# Patient Record
Sex: Male | Born: 1950 | Hispanic: Yes | Marital: Married | Smoking: Former smoker
Health system: Southern US, Community
[De-identification: ages and names within clinical notes are randomized; demographics above are authoritative.]

## PROBLEM LIST (undated history)

## (undated) DIAGNOSIS — R350 Frequency of micturition: Secondary | ICD-10-CM

## (undated) DIAGNOSIS — R51 Headache: Secondary | ICD-10-CM

## (undated) DIAGNOSIS — I10 Essential (primary) hypertension: Secondary | ICD-10-CM

## (undated) DIAGNOSIS — F329 Major depressive disorder, single episode, unspecified: Secondary | ICD-10-CM

## (undated) DIAGNOSIS — R200 Anesthesia of skin: Secondary | ICD-10-CM

## (undated) DIAGNOSIS — M542 Cervicalgia: Secondary | ICD-10-CM

## (undated) DIAGNOSIS — M1990 Unspecified osteoarthritis, unspecified site: Secondary | ICD-10-CM

## (undated) DIAGNOSIS — F32A Depression, unspecified: Secondary | ICD-10-CM

## (undated) DIAGNOSIS — E119 Type 2 diabetes mellitus without complications: Secondary | ICD-10-CM

## (undated) DIAGNOSIS — R519 Headache, unspecified: Secondary | ICD-10-CM

## (undated) DIAGNOSIS — K4090 Unilateral inguinal hernia, without obstruction or gangrene, not specified as recurrent: Secondary | ICD-10-CM

## (undated) DIAGNOSIS — R202 Paresthesia of skin: Secondary | ICD-10-CM

## (undated) DIAGNOSIS — E7800 Pure hypercholesterolemia, unspecified: Secondary | ICD-10-CM

## (undated) DIAGNOSIS — C801 Malignant (primary) neoplasm, unspecified: Secondary | ICD-10-CM

## (undated) HISTORY — PX: SHOULDER ARTHROSCOPY: SHX128

## (undated) HISTORY — PX: BACK SURGERY: SHX140

---

## 2014-08-03 ENCOUNTER — Other Ambulatory Visit (HOSPITAL_COMMUNITY): Payer: Self-pay | Admitting: Family

## 2014-08-03 DIAGNOSIS — R1031 Right lower quadrant pain: Secondary | ICD-10-CM

## 2014-08-15 ENCOUNTER — Ambulatory Visit (HOSPITAL_COMMUNITY)
Admission: RE | Admit: 2014-08-15 | Discharge: 2014-08-15 | Disposition: A | Discharge status: Home / Self Care | Payer: Medicaid Other | Source: Ambulatory Visit | Attending: Family | Admitting: Family

## 2014-08-15 DIAGNOSIS — R109 Unspecified abdominal pain: Secondary | ICD-10-CM | POA: Diagnosis present

## 2014-08-15 DIAGNOSIS — R1031 Right lower quadrant pain: Secondary | ICD-10-CM

## 2014-08-15 DIAGNOSIS — K4020 Bilateral inguinal hernia, without obstruction or gangrene, not specified as recurrent: Secondary | ICD-10-CM | POA: Diagnosis not present

## 2014-08-15 DIAGNOSIS — N400 Enlarged prostate without lower urinary tract symptoms: Secondary | ICD-10-CM | POA: Insufficient documentation

## 2014-08-15 MED ORDER — IOHEXOL 300 MG/ML  SOLN
100.0000 mL | Freq: Once | INTRAMUSCULAR | Status: AC | PRN
  Administered 2014-08-15: 100 mL via INTRAVENOUS

## 2015-03-21 ENCOUNTER — Other Ambulatory Visit: Payer: Self-pay | Admitting: Neurosurgery

## 2015-03-29 ENCOUNTER — Encounter (HOSPITAL_COMMUNITY): Payer: Self-pay

## 2015-03-29 ENCOUNTER — Encounter (HOSPITAL_COMMUNITY)
Admission: RE | Admit: 2015-03-29 | Discharge: 2015-03-29 | Disposition: A | Discharge status: Home / Self Care | Payer: Medicaid Other | Source: Ambulatory Visit | Attending: Neurosurgery | Admitting: Neurosurgery

## 2015-03-29 DIAGNOSIS — M50021 Cervical disc disorder at C4-C5 level with myelopathy: Secondary | ICD-10-CM | POA: Diagnosis not present

## 2015-03-29 DIAGNOSIS — I110 Hypertensive heart disease with heart failure: Secondary | ICD-10-CM | POA: Diagnosis not present

## 2015-03-29 DIAGNOSIS — E119 Type 2 diabetes mellitus without complications: Secondary | ICD-10-CM | POA: Diagnosis not present

## 2015-03-29 DIAGNOSIS — Z01812 Encounter for preprocedural laboratory examination: Secondary | ICD-10-CM | POA: Insufficient documentation

## 2015-03-29 DIAGNOSIS — M50022 Cervical disc disorder at C5-C6 level with myelopathy: Secondary | ICD-10-CM | POA: Diagnosis not present

## 2015-03-29 DIAGNOSIS — M4712 Other spondylosis with myelopathy, cervical region: Secondary | ICD-10-CM | POA: Diagnosis present

## 2015-03-29 DIAGNOSIS — E7800 Pure hypercholesterolemia, unspecified: Secondary | ICD-10-CM | POA: Diagnosis not present

## 2015-03-29 DIAGNOSIS — Z01818 Encounter for other preprocedural examination: Secondary | ICD-10-CM | POA: Insufficient documentation

## 2015-03-29 DIAGNOSIS — F17210 Nicotine dependence, cigarettes, uncomplicated: Secondary | ICD-10-CM | POA: Diagnosis not present

## 2015-03-29 HISTORY — DX: Frequency of micturition: R35.0

## 2015-03-29 HISTORY — DX: Major depressive disorder, single episode, unspecified: F32.9

## 2015-03-29 HISTORY — DX: Anesthesia of skin: R20.0

## 2015-03-29 HISTORY — DX: Essential (primary) hypertension: I10

## 2015-03-29 HISTORY — DX: Pure hypercholesterolemia, unspecified: E78.00

## 2015-03-29 HISTORY — DX: Depression, unspecified: F32.A

## 2015-03-29 HISTORY — DX: Paresthesia of skin: R20.2

## 2015-03-29 HISTORY — DX: Type 2 diabetes mellitus without complications: E11.9

## 2015-03-29 LAB — BASIC METABOLIC PANEL
Anion gap: 9 (ref 5–15)
BUN: 14 mg/dL (ref 6–20)
CO2: 23 mmol/L (ref 22–32)
Calcium: 9.6 mg/dL (ref 8.9–10.3)
Chloride: 108 mmol/L (ref 101–111)
Creatinine, Ser: 0.66 mg/dL (ref 0.61–1.24)
GFR calc Af Amer: >60 mL/min (ref >60)
GFR calc non Af Amer: >60 mL/min (ref >60)
Glucose, Bld: 128 mg/dL — ABNORMAL HIGH (ref 65–99)
Potassium: 4 mmol/L (ref 3.5–5.1)
Sodium: 140 mmol/L (ref 135–145)

## 2015-03-29 LAB — CBC
HCT: 51.4 % (ref 39.0–52.0)
Hemoglobin: 17.9 g/dL — ABNORMAL HIGH (ref 13.0–17.0)
MCH: 31.2 pg (ref 26.0–34.0)
MCHC: 34.8 g/dL (ref 30.0–36.0)
MCV: 89.5 fL (ref 78.0–100.0)
Platelets: 186 10*3/uL (ref 150–400)
RBC: 5.74 MIL/uL (ref 4.22–5.81)
RDW: 13.7 % (ref 11.5–15.5)
WBC: 9.2 10*3/uL (ref 4.0–10.5)

## 2015-03-29 LAB — SURGICAL PCR SCREEN
MRSA, PCR: NEGATIVE
Staphylococcus aureus: NEGATIVE

## 2015-03-29 LAB — GLUCOSE, CAPILLARY: Glucose-Capillary: 143 mg/dL — ABNORMAL HIGH (ref 65–99)

## 2015-03-29 NOTE — Pre-Procedure Instructions (Signed)
Chase [name]  03/29/2015      [facility] [address], [place] [address] [place] [address] Phone: [phone] Fax: [phone]    Your procedure is scheduled on Wednesday, November 2nd, 2016.  Report to [facility] Admitting at  7:00 A.M.  Call this number if you have problems the morning of surgery:  [phone]   Remember:  Do not eat food or drink liquids after midnight.   Take these medicines the morning of surgery with A SIP OF WATER: None.   What do I do about my diabetes medications?   Do not take Glyburide-metformin, oral diabetes medicines (pills) the morning of surgery.  Night prior to surgery, do NOT take evening/dinner dose of Glyburide-metformin (Glucovance).     Stop taking the following: Omega-3 Fatty Acids (Fish Oil), Aspirin, NSAIDS, Aleve, Naproxen, BC's, Goody's, Ibuprofen, Advil, Motrin, all herbal medications, and all vitamins.    Do not wear jewelry.  Do not wear lotions, powders, or colognes.  You may NOT wear deodorant.  Men may shave face and neck.  Do not bring valuables to the hospital.  [facility] is not responsible for any belongings or valuables.  Contacts, dentures or bridgework may not be worn into surgery.  Leave your suitcase in the car.  After surgery it may be brought to your room.  For patients admitted to the hospital, discharge time will be determined by your treatment team.  Patients discharged the day of surgery will not be allowed to drive home.   Special instructions:  See attached.   Please read over the following fact sheets that you were given. Pain Booklet, Coughing and Deep Breathing, MRSA Information and Surgical Site Infection Prevention    How to Manage Your Diabetes Before Surgery   Why is it important to control my blood sugar before and after surgery?   Improving blood sugar levels before and after surgery helps healing and can limit problems.  A way of  improving blood sugar control is eating a healthy diet by:  - Eating less sugar and carbohydrates  - Increasing activity/exercise  - Talk with your doctor about reaching your blood sugar goals  High blood sugars (greater than 180 mg/dL) can raise your risk of infections and slow down your recovery so you will need to focus on controlling your diabetes during the weeks before surgery.  Make sure that the doctor who takes care of your diabetes knows about your planned surgery including the date and location.  How do I manage my blood sugars before surgery?   Check your blood sugar at least 4 times a day, 2 days before surgery to make sure that they are not too high or low.   Check your blood sugar the morning of your surgery when you wake up and every 2 hours until you get to the Short-Stay unit.  If your blood sugar is less than 70 mg/dL, you will need to treat for low blood sugar by:  Treat a low blood sugar (less than 70 mg/dL) with 1/2 cup of clear juice (cranberry or apple), 4 glucose tablets, OR glucose gel.  Recheck blood sugar in 15 minutes after treatment (to make sure it is greater than 70 mg/dL).  If blood sugar is not greater than 70 mg/dL on re-check, call [phone] for further instructions.   Report your blood sugar to the Short-Stay nurse when you get to Short-Stay.  References:  [employer] [facility], 2007 "How to Manage your Diabetes Before and After Surgery".

## 2015-03-29 NOTE — Progress Notes (Signed)
   03/29/15 [place]  Have you ever been diagnosed with sleep apnea through a sleep study? No  Do you snore loudly (loud enough to be heard through closed doors)?  1  Do you often feel tired, fatigued, or sleepy during the daytime (such as falling asleep during driving or talking to someone)? 1  Has anyone observed you stop breathing during your sleep? 0  Do you have, or are you being treated for high blood pressure? 1  BMI more than 35 kg/m2? 0  Age > 50 (1-yes) 1  Neck circumference greater than:Male 16 inches or larger, Male 17inches or larger? 0  Male Gender (Yes=1) 1  Obstructive Sleep Apnea Score 5

## 2015-03-29 NOTE — Progress Notes (Addendum)
Interpreter present for PAT appointment.  PCP - [facility] Cardiologist - denies  EKG - 03/29/2015 CXR -denies  Echo/Stress test/Cardiac cath -denies  Patient denies shortness of breath and chest pain at PAT appointment.

## 2015-03-30 ENCOUNTER — Other Ambulatory Visit (HOSPITAL_COMMUNITY): Payer: Medicaid Other

## 2015-03-30 LAB — HEMOGLOBIN A1C
Hgb A1c MFr Bld: 8.6 % — ABNORMAL HIGH (ref 4.8–5.6)
Mean Plasma Glucose: 200 mg/dL

## 2015-04-03 MED ORDER — CEFAZOLIN SODIUM-DEXTROSE 2-3 GM-% IV SOLR
2.0000 g | INTRAVENOUS | Status: AC
  Administered 2015-04-04: 2 g via INTRAVENOUS
  Filled 2015-04-03: qty 50

## 2015-04-04 ENCOUNTER — Encounter (HOSPITAL_COMMUNITY): Payer: Self-pay | Admitting: Anesthesiology

## 2015-04-04 ENCOUNTER — Ambulatory Visit (HOSPITAL_COMMUNITY): Payer: Medicaid Other

## 2015-04-04 ENCOUNTER — Ambulatory Visit (HOSPITAL_COMMUNITY)
Admission: RE | Admit: 2015-04-04 | Discharge: 2015-04-05 | Disposition: A | Discharge status: Home / Self Care | Payer: Medicaid Other | Source: Ambulatory Visit | Attending: Neurosurgery | Admitting: Neurosurgery

## 2015-04-04 ENCOUNTER — Ambulatory Visit (HOSPITAL_COMMUNITY): Payer: Medicaid Other | Admitting: Certified Registered"

## 2015-04-04 ENCOUNTER — Encounter (HOSPITAL_COMMUNITY)
Admission: RE | Disposition: A | Discharge status: Home / Self Care | Payer: Self-pay | Source: Ambulatory Visit | Attending: Neurosurgery

## 2015-04-04 DIAGNOSIS — E7800 Pure hypercholesterolemia, unspecified: Secondary | ICD-10-CM | POA: Diagnosis not present

## 2015-04-04 DIAGNOSIS — I110 Hypertensive heart disease with heart failure: Secondary | ICD-10-CM | POA: Insufficient documentation

## 2015-04-04 DIAGNOSIS — M50022 Cervical disc disorder at C5-C6 level with myelopathy: Secondary | ICD-10-CM | POA: Diagnosis not present

## 2015-04-04 DIAGNOSIS — M4712 Other spondylosis with myelopathy, cervical region: Secondary | ICD-10-CM | POA: Diagnosis not present

## 2015-04-04 DIAGNOSIS — Z419 Encounter for procedure for purposes other than remedying health state, unspecified: Secondary | ICD-10-CM

## 2015-04-04 DIAGNOSIS — F17210 Nicotine dependence, cigarettes, uncomplicated: Secondary | ICD-10-CM | POA: Insufficient documentation

## 2015-04-04 DIAGNOSIS — M5000 Cervical disc disorder with myelopathy, unspecified cervical region: Secondary | ICD-10-CM | POA: Diagnosis present

## 2015-04-04 DIAGNOSIS — M50021 Cervical disc disorder at C4-C5 level with myelopathy: Secondary | ICD-10-CM | POA: Diagnosis not present

## 2015-04-04 DIAGNOSIS — E119 Type 2 diabetes mellitus without complications: Secondary | ICD-10-CM | POA: Insufficient documentation

## 2015-04-04 HISTORY — PX: ANTERIOR CERVICAL DECOMP/DISCECTOMY FUSION: SHX1161

## 2015-04-04 LAB — GLUCOSE, CAPILLARY
Glucose-Capillary: 96 mg/dL (ref 65–99)
Glucose-Capillary: 109 mg/dL — ABNORMAL HIGH (ref 65–99)
Glucose-Capillary: 177 mg/dL — ABNORMAL HIGH (ref 65–99)
Glucose-Capillary: 193 mg/dL — ABNORMAL HIGH (ref 65–99)

## 2015-04-04 SURGERY — ANTERIOR CERVICAL DECOMPRESSION/DISCECTOMY FUSION 2 LEVELS
Anesthesia: General

## 2015-04-04 MED ORDER — PROMETHAZINE HCL 25 MG/ML IJ SOLN: 6.2500 mg | INTRAMUSCULAR | Status: DC | PRN

## 2015-04-04 MED ORDER — FENTANYL CITRATE (PF) 250 MCG/5ML IJ SOLN
INTRAMUSCULAR | Status: AC
  Filled 2015-04-04: qty 5

## 2015-04-04 MED ORDER — GLYCOPYRROLATE 0.2 MG/ML IJ SOLN
INTRAMUSCULAR | Status: AC
  Filled 2015-04-04: qty 1

## 2015-04-04 MED ORDER — LACTATED RINGERS IV SOLN
INTRAVENOUS | Status: DC | PRN
  Administered 2015-04-04 (×2): via INTRAVENOUS

## 2015-04-04 MED ORDER — FENTANYL CITRATE (PF) 100 MCG/2ML IJ SOLN
INTRAMUSCULAR | Status: DC | PRN
  Administered 2015-04-04: 50 ug via INTRAVENOUS
  Administered 2015-04-04: 100 ug via INTRAVENOUS
  Administered 2015-04-04 (×2): 50 ug via INTRAVENOUS

## 2015-04-04 MED ORDER — ZOLPIDEM TARTRATE 5 MG PO TABS: 5.0000 mg | ORAL_TABLET | Freq: Every evening | ORAL | Status: DC | PRN

## 2015-04-04 MED ORDER — 0.9 % SODIUM CHLORIDE (POUR BTL) OPTIME
TOPICAL | Status: DC | PRN
  Administered 2015-04-04: 1000 mL

## 2015-04-04 MED ORDER — HYDROMORPHONE HCL 1 MG/ML IJ SOLN
0.2500 mg | INTRAMUSCULAR | Status: DC | PRN
  Administered 2015-04-04 (×2): 0.5 mg via INTRAVENOUS

## 2015-04-04 MED ORDER — GLYBURIDE 5 MG PO TABS
5.0000 mg | ORAL_TABLET | Freq: Two times a day (BID) | ORAL | Status: DC
  Administered 2015-04-04: 5 mg via ORAL
  Filled 2015-04-04 (×4): qty 1

## 2015-04-04 MED ORDER — SODIUM CHLORIDE 0.9 % IJ SOLN
3.0000 mL | Freq: Two times a day (BID) | INTRAMUSCULAR | Status: DC
  Administered 2015-04-04: 3 mL via INTRAVENOUS

## 2015-04-04 MED ORDER — PHENYLEPHRINE 40 MCG/ML (10ML) SYRINGE FOR IV PUSH (FOR BLOOD PRESSURE SUPPORT)
PREFILLED_SYRINGE | INTRAVENOUS | Status: AC
  Filled 2015-04-04: qty 10

## 2015-04-04 MED ORDER — PROPOFOL 10 MG/ML IV BOLUS
INTRAVENOUS | Status: DC | PRN
  Administered 2015-04-04: 160 mg via INTRAVENOUS

## 2015-04-04 MED ORDER — DIAZEPAM 5 MG PO TABS
5.0000 mg | ORAL_TABLET | Freq: Four times a day (QID) | ORAL | Status: DC | PRN
  Administered 2015-04-04: 5 mg via ORAL
  Filled 2015-04-04: qty 1

## 2015-04-04 MED ORDER — PRAVASTATIN SODIUM 40 MG PO TABS
40.0000 mg | ORAL_TABLET | Freq: Every day | ORAL | Status: DC
  Administered 2015-04-04 – 2015-04-05 (×2): 40 mg via ORAL
  Filled 2015-04-04 (×2): qty 1

## 2015-04-04 MED ORDER — MORPHINE SULFATE (PF) 2 MG/ML IV SOLN
1.0000 mg | INTRAVENOUS | Status: DC | PRN
  Administered 2015-04-04: 4 mg via INTRAVENOUS
  Filled 2015-04-04: qty 2

## 2015-04-04 MED ORDER — SUGAMMADEX SODIUM 200 MG/2ML IV SOLN
INTRAVENOUS | Status: AC
  Filled 2015-04-04: qty 2

## 2015-04-04 MED ORDER — SODIUM CHLORIDE 0.9 % IV SOLN: 250.0000 mL | INTRAVENOUS | Status: DC

## 2015-04-04 MED ORDER — MEPERIDINE HCL 25 MG/ML IJ SOLN: 6.2500 mg | INTRAMUSCULAR | Status: DC | PRN

## 2015-04-04 MED ORDER — ROCURONIUM BROMIDE 50 MG/5ML IV SOLN
INTRAVENOUS | Status: AC
  Filled 2015-04-04: qty 1

## 2015-04-04 MED ORDER — MENTHOL 3 MG MT LOZG: 1.0000 | LOZENGE | OROMUCOSAL | Status: DC | PRN

## 2015-04-04 MED ORDER — METFORMIN HCL 500 MG PO TABS
500.0000 mg | ORAL_TABLET | Freq: Two times a day (BID) | ORAL | Status: DC
  Administered 2015-04-04: 500 mg via ORAL
  Filled 2015-04-04 (×2): qty 1

## 2015-04-04 MED ORDER — LISINOPRIL 5 MG PO TABS
5.0000 mg | ORAL_TABLET | Freq: Every day | ORAL | Status: DC
  Administered 2015-04-04 – 2015-04-05 (×2): 5 mg via ORAL
  Filled 2015-04-04 (×2): qty 1

## 2015-04-04 MED ORDER — PHENOL 1.4 % MT LIQD: 1.0000 | OROMUCOSAL | Status: DC | PRN

## 2015-04-04 MED ORDER — MIDAZOLAM HCL 2 MG/2ML IJ SOLN
INTRAMUSCULAR | Status: AC
  Filled 2015-04-04: qty 4

## 2015-04-04 MED ORDER — HYDROCODONE-ACETAMINOPHEN 5-325 MG PO TABS
1.0000 | ORAL_TABLET | ORAL | Status: DC | PRN
  Administered 2015-04-04 – 2015-04-05 (×2): 2 via ORAL
  Filled 2015-04-04 (×2): qty 1
  Filled 2015-04-04: qty 2

## 2015-04-04 MED ORDER — ACETAMINOPHEN 325 MG PO TABS: 650.0000 mg | ORAL_TABLET | ORAL | Status: DC | PRN

## 2015-04-04 MED ORDER — OXYCODONE-ACETAMINOPHEN 5-325 MG PO TABS
1.0000 | ORAL_TABLET | ORAL | Status: DC | PRN
  Administered 2015-04-04 – 2015-04-05 (×2): 2 via ORAL
  Filled 2015-04-04 (×2): qty 2

## 2015-04-04 MED ORDER — ONDANSETRON HCL 4 MG/2ML IJ SOLN
INTRAMUSCULAR | Status: DC | PRN
  Administered 2015-04-04: 4 mg via INTRAVENOUS

## 2015-04-04 MED ORDER — HYDROMORPHONE HCL 1 MG/ML IJ SOLN
INTRAMUSCULAR | Status: AC
  Filled 2015-04-04: qty 1

## 2015-04-04 MED ORDER — SUGAMMADEX SODIUM 200 MG/2ML IV SOLN
INTRAVENOUS | Status: DC | PRN
  Administered 2015-04-04: 141.8 mg via INTRAVENOUS

## 2015-04-04 MED ORDER — LIDOCAINE-EPINEPHRINE 0.5 %-1:200000 IJ SOLN
INTRAMUSCULAR | Status: DC | PRN
  Administered 2015-04-04: 3 mL

## 2015-04-04 MED ORDER — POLYETHYLENE GLYCOL 3350 17 G PO PACK: 17.0000 g | PACK | Freq: Every day | ORAL | Status: DC | PRN

## 2015-04-04 MED ORDER — ROCURONIUM BROMIDE 100 MG/10ML IV SOLN
INTRAVENOUS | Status: DC | PRN
  Administered 2015-04-04: 10 mg via INTRAVENOUS
  Administered 2015-04-04: 40 mg via INTRAVENOUS

## 2015-04-04 MED ORDER — DEXAMETHASONE SODIUM PHOSPHATE 4 MG/ML IJ SOLN
INTRAMUSCULAR | Status: DC | PRN
  Administered 2015-04-04: 4 mg via INTRAVENOUS

## 2015-04-04 MED ORDER — LACTATED RINGERS IV SOLN
INTRAVENOUS | Status: DC
  Administered 2015-04-04: 08:00:00 via INTRAVENOUS

## 2015-04-04 MED ORDER — PHENYLEPHRINE HCL 10 MG/ML IJ SOLN
INTRAMUSCULAR | Status: DC | PRN
  Administered 2015-04-04 (×5): 80 ug via INTRAVENOUS

## 2015-04-04 MED ORDER — THROMBIN 5000 UNITS EX SOLR
CUTANEOUS | Status: DC | PRN
  Administered 2015-04-04: 5000 [IU] via TOPICAL

## 2015-04-04 MED ORDER — PROPOFOL 10 MG/ML IV BOLUS
INTRAVENOUS | Status: AC
  Filled 2015-04-04: qty 20

## 2015-04-04 MED ORDER — ACETAMINOPHEN 650 MG RE SUPP: 650.0000 mg | RECTAL | Status: DC | PRN

## 2015-04-04 MED ORDER — SODIUM CHLORIDE 0.9 % IJ SOLN: 3.0000 mL | INTRAMUSCULAR | Status: DC | PRN

## 2015-04-04 MED ORDER — POTASSIUM CHLORIDE IN NACL 20-0.9 MEQ/L-% IV SOLN
INTRAVENOUS | Status: DC
  Filled 2015-04-04 (×4): qty 1000

## 2015-04-04 MED ORDER — ONDANSETRON HCL 4 MG/2ML IJ SOLN: 4.0000 mg | INTRAMUSCULAR | Status: DC | PRN

## 2015-04-04 MED ORDER — GLYBURIDE-METFORMIN 5-500 MG PO TABS: 1.0000 | ORAL_TABLET | Freq: Two times a day (BID) | ORAL | Status: DC

## 2015-04-04 MED ORDER — LIDOCAINE HCL (CARDIAC) 20 MG/ML IV SOLN
INTRAVENOUS | Status: AC
  Filled 2015-04-04: qty 5

## 2015-04-04 MED ORDER — HEMOSTATIC AGENTS (NO CHARGE) OPTIME
TOPICAL | Status: DC | PRN
  Administered 2015-04-04: 1 via TOPICAL

## 2015-04-04 SURGICAL SUPPLY — 68 items
BIT DRILL NEURO 2X3.1 SFT TUCH (MISCELLANEOUS) ×1 IMPLANT
BNDG GAUZE ELAST 4 BULKY (GAUZE/BANDAGES/DRESSINGS) IMPLANT
BUR DRUM 4.0 (BURR) ×2 IMPLANT
BUR DRUM 4.0MM (BURR) ×1
CANISTER SUCT 3000ML PPV (MISCELLANEOUS) ×3 IMPLANT
DECANTER SPIKE VIAL GLASS SM (MISCELLANEOUS) ×3 IMPLANT
DRAPE LAPAROTOMY 100X72 PEDS (DRAPES) ×3 IMPLANT
DRAPE MICROSCOPE LEICA (MISCELLANEOUS) ×3 IMPLANT
DRAPE POUCH INSTRU U-SHP 10X18 (DRAPES) ×3 IMPLANT
DRAPE PROXIMA HALF (DRAPES) IMPLANT
DRILL NEURO 2X3.1 SOFT TOUCH (MISCELLANEOUS) ×3
DURAPREP 6ML APPLICATOR 50/CS (WOUND CARE) ×3 IMPLANT
ELECT COATED BLADE 2.86 ST (ELECTRODE) ×3 IMPLANT
ELECT REM PT RETURN 9FT ADLT (ELECTROSURGICAL) ×3
ELECTRODE REM PT RTRN 9FT ADLT (ELECTROSURGICAL) ×1 IMPLANT
GAUZE SPONGE 4X4 16PLY XRAY LF (GAUZE/BANDAGES/DRESSINGS) IMPLANT
GLOVE BIO SURGEON STRL SZ 6.5 (GLOVE) IMPLANT
GLOVE BIO SURGEON STRL SZ7 (GLOVE) IMPLANT
GLOVE BIO SURGEON STRL SZ7.5 (GLOVE) IMPLANT
GLOVE BIO SURGEON STRL SZ8 (GLOVE) ×3 IMPLANT
GLOVE BIO SURGEON STRL SZ8.5 (GLOVE) IMPLANT
GLOVE BIO SURGEONS STRL SZ 6.5 (GLOVE)
GLOVE BIOGEL M 8.0 STRL (GLOVE) IMPLANT
GLOVE ECLIPSE 6.5 STRL STRAW (GLOVE) ×6 IMPLANT
GLOVE ECLIPSE 7.0 STRL STRAW (GLOVE) IMPLANT
GLOVE ECLIPSE 7.5 STRL STRAW (GLOVE) IMPLANT
GLOVE ECLIPSE 8.0 STRL XLNG CF (GLOVE) IMPLANT
GLOVE ECLIPSE 8.5 STRL (GLOVE) IMPLANT
GLOVE EXAM NITRILE LRG STRL (GLOVE) IMPLANT
GLOVE EXAM NITRILE MD LF STRL (GLOVE) IMPLANT
GLOVE EXAM NITRILE XL STR (GLOVE) IMPLANT
GLOVE EXAM NITRILE XS STR PU (GLOVE) IMPLANT
GLOVE INDICATOR 6.5 STRL GRN (GLOVE) IMPLANT
GLOVE INDICATOR 7.0 STRL GRN (GLOVE) ×3 IMPLANT
GLOVE INDICATOR 7.5 STRL GRN (GLOVE) ×3 IMPLANT
GLOVE INDICATOR 8.0 STRL GRN (GLOVE) IMPLANT
GLOVE INDICATOR 8.5 STRL (GLOVE) IMPLANT
GLOVE OPTIFIT SS 8.0 STRL (GLOVE) IMPLANT
GLOVE SURG SS PI 6.5 STRL IVOR (GLOVE) IMPLANT
GOWN STRL REUS W/ TWL LRG LVL3 (GOWN DISPOSABLE) ×3 IMPLANT
GOWN STRL REUS W/ TWL XL LVL3 (GOWN DISPOSABLE) IMPLANT
GOWN STRL REUS W/TWL 2XL LVL3 (GOWN DISPOSABLE) IMPLANT
GOWN STRL REUS W/TWL LRG LVL3 (GOWN DISPOSABLE) ×6
GOWN STRL REUS W/TWL XL LVL3 (GOWN DISPOSABLE)
GRAFT DURAGEN MATRIX 1WX1L (Tissue) ×3 IMPLANT
KIT BASIN OR (CUSTOM PROCEDURE TRAY) ×3 IMPLANT
KIT ROOM TURNOVER OR (KITS) ×3 IMPLANT
LIQUID BAND (GAUZE/BANDAGES/DRESSINGS) ×3 IMPLANT
NEEDLE HYPO 25X1 1.5 SAFETY (NEEDLE) ×3 IMPLANT
NEEDLE SPNL 22GX3.5 QUINCKE BK (NEEDLE) ×6 IMPLANT
NS IRRIG 1000ML POUR BTL (IV SOLUTION) ×3 IMPLANT
PACK LAMINECTOMY NEURO (CUSTOM PROCEDURE TRAY) ×3 IMPLANT
PAD ARMBOARD 7.5X6 YLW CONV (MISCELLANEOUS) ×9 IMPLANT
PIN DISTRACTION 14MM (PIN) ×6 IMPLANT
PLATE R HELIX 38MM (Plate) ×3 IMPLANT
RUBBERBAND STERILE (MISCELLANEOUS) ×6 IMPLANT
SCREW 4.0X13 (Screw) ×6 IMPLANT
SCREW 4.0X13MM (Screw) ×12 IMPLANT
SPACER ACF PARALLEL 7MM (Bone Implant) IMPLANT
SPACER CC-ACF 8MM PARALLEL (Bone Implant) ×6 IMPLANT
SPONGE INTESTINAL PEANUT (DISPOSABLE) ×3 IMPLANT
SPONGE SURGIFOAM ABS GEL SZ50 (HEMOSTASIS) ×3 IMPLANT
SUT VIC AB 0 CT1 27 (SUTURE) ×2
SUT VIC AB 0 CT1 27XBRD ANTBC (SUTURE) ×1 IMPLANT
SUT VIC AB 3-0 SH 8-18 (SUTURE) ×3 IMPLANT
TOWEL OR 17X24 6PK STRL BLUE (TOWEL DISPOSABLE) ×3 IMPLANT
TOWEL OR 17X26 10 PK STRL BLUE (TOWEL DISPOSABLE) ×3 IMPLANT
WATER STERILE IRR 1000ML POUR (IV SOLUTION) ×3 IMPLANT

## 2015-04-04 NOTE — Progress Notes (Signed)
PATIENT SPEAKS VERY LITTLE ENGLISH SPANISH INTERPRETER CARLOS MEDEIRES WITH PATIENT TO ASSIST WITH ASSESSMENT

## 2015-04-04 NOTE — Progress Notes (Signed)
Interpreter at bedside.

## 2015-04-04 NOTE — Anesthesia Postprocedure Evaluation (Signed)
Anesthesia Post Note  Patient: Chase [name]  Procedure(s) Performed: Procedure(s) (LRB): ANTERIOR CERVICAL DECOMPRESSION/DISCECTOMY FUSION PLATING BONEGRAFT CERVICAL FOUR-FIVE CERVICAL FIVE-SIX (N/A)  Anesthesia type: General  Patient location: PACU  Post pain: Pain level controlled  Post assessment: Post-op Vital signs reviewed  Last Vitals: BP 136/83 mmHg  Pulse 85  Temp(Src) 36.7 C (Oral)  Resp 16  Ht 5\' 7"  (1.702 m)  Wt 156 lb 4.9 oz (70.9 kg)  BMI 24.48 kg/m2  SpO2 97%  Post vital signs: Reviewed  Level of consciousness: sedated  Complications: No apparent anesthesia complications

## 2015-04-04 NOTE — Progress Notes (Signed)
Interpreter at the bedside upon admission to the unit and was present for assessment. Pt and family understand some [place]. Will call interpreter as needed for patient. Holli Humbles, RN
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
## 2015-04-04 NOTE — Op Note (Signed)
04/04/2015  5:07 PM  PATIENT:  Chase [name]  64 y.o. male with cervical myelopathy, and altered cord signal due to compression at C4/5 and C5/6. He is admitted for anterior decompression of the spinal canal.   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 Vashti Hey was taken to the operating room, intubated, and placed under general anesthesia without difficulty. He was positioned supine with his head in slight extension on a horseshoe headrest. The neck was prepped and draped in a sterile manner. I infiltrated 3 cc's 1/2%lidocaine/1:200,000 strength epinephrine into the planned incision starting from the midline to the medial border of the left sternocleidomastoid muscle. I opened the incision with a 10 blade and dissected sharply through soft tissue to the platysma. I dissected in the plane superior to the platysma both rostrally and caudally. I then opened the platysma in a horizontal fashion with Metzenbaum scissors, and dissected in the inferior plane rostrally and caudally. With both blunt and sharp technique I created an avascular corridor to the cervical spine. I placed a spinal needle(s) in the disc space at 5/6 . I then reflected the longus colli from C4 to C6 and placed self retaining retractors. I opened the disc space(s) at C4/5,5/6 with a 15 blade. I removed disc with curettes, Kerrison punches, and the drill. Using the drill I removed  osteophytes and prepared for the decompression.  I decompressed the spinal canal and the C5,6 root(s) with the drill, Kerrison punches, and the curettes. I used the microscope to aid in microdissection. I removed the posterior longitudinal ligament to fully expose and decompress the thecal sac. I exposed the roots laterally taking down the 4/5,5/6 uncovertebral joints. With the decompression complete We moved on to the arthrodesis. We used the drill to level the surfaces of C4,5,and 6. I removed soft tissue to prepare the disc space and the bony surfaces. I measured the space and placed  52mm structural allografts into the disc spaces.  We then placed the anterior instrumentation. I placed 2 screws in each vertebral body through the plate. I locked the screws into place. Intraoperative xray showed the graft, plate, and screws to be in good position. I irrigated the wound, achieved hemostasis, and closed the wound in layers. I approximated the platysma, and the subcuticular plane with vicryl sutures. I used Dermabond for a sterile dressing.   PLAN OF CARE: Admit to inpatient   PATIENT DISPOSITION:  PACU - hemodynamically stable.   Delay start of Pharmacological VTE agent (>24hrs) due to surgical blood loss or risk of bleeding:  yes

## 2015-04-04 NOTE — Anesthesia Preprocedure Evaluation (Addendum)
Anesthesia Evaluation  Patient identified by MRN, date of birth, ID band Patient awake    Reviewed: Allergy & Precautions, NPO status , Patient's Chart, lab work & pertinent test results  Airway Mallampati: II  TM Distance: >3 FB Neck ROM: Full    Dental no notable dental hx.    Pulmonary Current Smoker,    Pulmonary exam normal breath sounds clear to auscultation       Cardiovascular hypertension, Pt. on medications Normal cardiovascular exam Rhythm:Regular Rate:Normal     Neuro/Psych negative neurological ROS  negative psych ROS   GI/Hepatic negative GI ROS, Neg liver ROS,   Endo/Other  diabetes, Poorly Controlled, Type 2, Oral Hypoglycemic Agents  Renal/GU negative Renal ROS     Musculoskeletal negative musculoskeletal ROS (+)   Abdominal   Peds  Hematology negative hematology ROS (+)   Anesthesia Other Findings   Reproductive/Obstetrics                          Anesthesia Physical Anesthesia Plan  ASA: III  Anesthesia Plan: General   Post-op Pain Management:    Induction: Intravenous  Airway Management Planned: Oral ETT  Additional Equipment:   Intra-op Plan:   Post-operative Plan: Extubation in OR  Informed Consent: I have reviewed the patients History and Physical, chart, labs and discussed the procedure including the risks, benefits and alternatives for the proposed anesthesia with the patient or authorized representative who has indicated his/her understanding and acceptance.   Dental advisory given  Plan Discussed with: CRNA  Anesthesia Plan Comments:         Anesthesia Quick Evaluation

## 2015-04-04 NOTE — Transfer of Care (Signed)
Immediate Anesthesia Transfer of Care Note  Patient: Chase [name]  Procedure(s) Performed: Procedure(s): ANTERIOR CERVICAL DECOMPRESSION/DISCECTOMY FUSION PLATING BONEGRAFT CERVICAL FOUR-FIVE CERVICAL FIVE-SIX (N/A)  Patient Location: PACU  Anesthesia Type:General  Level of Consciousness: awake, alert  and oriented  Airway & Oxygen Therapy: Patient Spontanous Breathing and Patient connected to nasal cannula oxygen  Post-op Assessment: Report given to RN and Post -op Vital signs reviewed and stable  Post vital signs: Reviewed and stable  Last Vitals:  Filed Vitals:   04/04/15 0802  BP: 115/83  Pulse: 62  Temp: 36.5 C  Resp: 18    Complications: No apparent anesthesia complications

## 2015-04-04 NOTE — Anesthesia Procedure Notes (Signed)
Procedure Name: Intubation Date/Time: 04/04/2015 10:15 AM Performed by: Eligha Bridegroom Pre-anesthesia Checklist: Emergency Drugs available, Timeout performed, Patient identified, Suction available and Patient being monitored Patient Re-evaluated:Patient Re-evaluated prior to inductionOxygen Delivery Method: Circle system utilized Preoxygenation: Pre-oxygenation with 100% oxygen Intubation Type: IV induction and Cricoid Pressure applied Ventilation: Oral airway inserted - appropriate to patient size and Mask ventilation without difficulty Laryngoscope Size: Mac and 4 Grade View: Grade III Tube type: Oral Tube size: 7.5 mm Number of attempts: 1 Airway Equipment and Method: Stylet and LTA kit utilized Placement Confirmation: ETT inserted through vocal cords under direct vision,  breath sounds checked- equal and bilateral and positive ETCO2 Secured at: 21 cm Tube secured with: Tape Dental Injury: Teeth and Oropharynx as per pre-operative assessment

## 2015-04-05 ENCOUNTER — Encounter (HOSPITAL_COMMUNITY): Payer: Self-pay | Admitting: Neurosurgery

## 2015-04-05 DIAGNOSIS — M50021 Cervical disc disorder at C4-C5 level with myelopathy: Secondary | ICD-10-CM | POA: Diagnosis not present

## 2015-04-05 LAB — GLUCOSE, CAPILLARY
Glucose-Capillary: 73 mg/dL (ref 65–99)
Glucose-Capillary: 126 mg/dL — ABNORMAL HIGH (ref 65–99)

## 2015-04-05 MED ORDER — LIVING WELL WITH DIABETES BOOK - IN SPANISH
Freq: Once | Status: AC
  Administered 2015-04-05: 10:00:00
  Filled 2015-04-05 (×2): qty 1

## 2015-04-05 MED ORDER — CYCLOBENZAPRINE HCL 10 MG PO TABS
10.0000 mg | ORAL_TABLET | Freq: Three times a day (TID) | ORAL | Status: DC | PRN
Start: 2015-04-05 — End: 2015-08-07

## 2015-04-05 MED ORDER — OXYCODONE-ACETAMINOPHEN 5-325 MG PO TABS: 1.0000 | ORAL_TABLET | Freq: Four times a day (QID) | ORAL | Status: DC | PRN

## 2015-04-05 NOTE — Evaluation (Signed)
Occupational Therapy Evaluation Patient Details Name: Chase [name] MRN: [id number] DOB: [birth year] Today's Date: 04/05/2015    History of Present Illness Pt is a 64 y/o male who presents s/p C4-C6 ACDF on 04/04/15.   Clinical Impression   PT admitted for the above diagnosis and has the deficits listed below.  Pt would benefit from cont OT to increase BUE strength and coordination and simple fine motor tasks so he can d/c home with his family.  Pt also reports several falls in last few years.  Spoke with PT about outpt PT consult.    Follow Up Recommendations  Outpatient OT;Supervision/Assistance - 24 hour    Equipment Recommendations  None recommended by OT    Recommendations for Other Services       Precautions / Restrictions Precautions Precautions: Fall;Cervical Precaution Comments: Handout issued - per RN will print out precautions sheet in Spanish with discharge instructions.  Restrictions Weight Bearing Restrictions: No      Mobility Bed Mobility Overal bed mobility: Needs Assistance Bed Mobility: Rolling;Sidelying to Sit Rolling: Supervision Sidelying to sit: Min guard       General bed mobility comments: Multimodal cues for log roll technique. Son assisted with giving directions due to language barrier.   Transfers Overall transfer level: Needs assistance Equipment used: Straight cane Transfers: Sit to/from Stand Sit to Stand: Min guard         General transfer comment: Initial assist required to gain/maintain balance as pt powered-up to full stand.     Balance Overall balance assessment: Needs assistance Sitting-balance support: Feet supported;No upper extremity supported Sitting balance-Leahy Scale: Good     Standing balance support: No upper extremity supported;During functional activity Standing balance-Leahy Scale: Fair Standing balance comment: Pt could stand and walk with a cane without outside support but did not appear to be safe and steady.   Pt never lost balance and required cues to use cane.                              ADL Overall ADL's : Needs assistance/impaired Eating/Feeding: Set up;Sitting Eating/Feeding Details (indicate cue type and reason): assist with opening packages. Grooming: Wash/dry face;Oral care;Brushing hair;Minimal assistance;Standing Grooming Details (indicate cue type and reason): Pt unable to reach overhead with RUE to comb hair and fine motor tasks difficult to open containers etc. Upper Body Bathing: Set up;Sitting Upper Body Bathing Details (indicate cue type and reason): Pt can complete the task but does struggle due to UE weakness and coordination deficits. Lower Body Bathing: Min guard;Sit to/from stand Lower Body Bathing Details (indicate cue type and reason): Pt unsteady on his feet when standing and fumbles using BUEs but can complete the task w/o physical assist. Upper Body Dressing : Set up;Sitting Upper Body Dressing Details (indicate cue type and reason): assist with buttons. Lower Body Dressing: Set up;Sit to/from stand Lower Body Dressing Details (indicate cue type and reason): Pt needs assist with something other than slip on shoes.  Pt needs assist with fasteners and S to stand. Toilet Transfer: Min guard;Ambulation Toilet Transfer Details (indicate cue type and reason): S for balance. Toileting- Water quality scientist and Hygiene: Min guard;Sit to/from stand Toileting - Clothing Manipulation Details (indicate cue type and reason): with elastic pants, pt does not need assist but does if fasteners present. Tub/ Shower Transfer:  (Son states pt sponge bathes?)   Functional mobility during ADLs: Min guard;Cane General ADL Comments: PT overall at min guard to  S level due to weakness in BUES Right greater than L and poor balance.  Pt reports he falls about once a month.     Vision     Perception     Praxis      Pertinent Vitals/Pain Pain Assessment: 0-10 Pain Score: 5   Pain Location: neck Pain Descriptors / Indicators: Aching;Operative site guarding Pain Intervention(s): Monitored during session;Repositioned     Hand Dominance Right   Extremity/Trunk Assessment Upper Extremity Assessment Upper Extremity Assessment: RUE deficits/detail;LUE deficits/detail RUE Deficits / Details: RUE AROM:  Shoulder 35 flexion, 40 abduction.  Othwise WFL except R first digit lacks full MCP flexion.  Strength:  Shoulder 2/5.  Otherwise WFL except grip 3+/5 RUE: Unable to fully assess due to pain RUE Sensation: decreased light touch (Pt reports numbness in R thumb.) RUE Coordination: decreased fine motor;decreased gross motor LUE Deficits / Details: AROM L shoulder 90 flexion, 80 abduction.  Otherwise WFL  Strength:  Shoulder 3+/5   grip 4/5.  LUE: Unable to fully assess due to pain LUE Sensation: decreased light touch (Pins and needles felt base of L thumb.) LUE Coordination: decreased fine motor;decreased gross motor   Lower Extremity Assessment Lower Extremity Assessment: Defer to PT evaluation   Cervical / Trunk Assessment Cervical / Trunk Assessment: Normal   Communication Communication Communication: Prefers language other than English   Cognition Arousal/Alertness: Awake/alert Behavior During Therapy: WFL for tasks assessed/performed Overall Cognitive Status: Within Functional Limits for tasks assessed       Memory: Decreased recall of precautions             General Comments       Exercises       Shoulder Instructions      Home Living Family/patient expects to be discharged to:: Private residence Living Arrangements: Spouse/significant other;Children Available Help at Discharge: Family;Available 24 hours/day Type of Home: House Home Access: Level entry     Home Layout: One level     Bathroom Shower/Tub: Tub/shower unit Shower/tub characteristics: Curtain Biochemist, clinical: Standard Bathroom Accessibility: Yes   Home Equipment:  Cane - single point;Toilet riser   Additional Comments: Pt's son reports that pt has taken and left some of his DME to [place] recently      Prior Functioning/Environment Level of Independence: Independent with assistive device(s)        Comments: Used cane all the time. Was able to complete ADL's without assist.     OT Diagnosis: Generalized weakness;Acute pain   OT Problem List: Decreased strength;Decreased range of motion;Impaired balance (sitting and/or standing);Decreased coordination;Decreased knowledge of use of DME or AE;Decreased safety awareness;Impaired UE functional use;Pain   OT Treatment/Interventions: Self-care/ADL training;DME and/or AE instruction;Therapeutic exercise    OT Goals(Current goals can be found in the care plan section) Acute Rehab OT Goals Patient Stated Goal: Pt did not state goals at d/c OT Goal Formulation: With patient/family Time For Goal Achievement: 04/19/15 Potential to Achieve Goals: Good ADL Goals Pt Will Perform Grooming: with supervision;standing Pt Will Perform Lower Body Dressing: with supervision;sit to/from stand Pt/caregiver will Perform Home Exercise Program: Both right and left upper extremity;With theraputty  OT Frequency: Min 2X/week   Barriers to D/C:            Co-evaluation              End of Session Nurse Communication: Mobility status  Activity Tolerance: Patient limited by pain Patient left: in chair;with call bell/phone within reach;with family/visitor present   Time: 5974-1638  OT Time Calculation (min): 21 min Charges:  OT General Charges $OT Visit: 1 Procedure OT Evaluation $Initial OT Evaluation Tier I: 1 Procedure G-Codes: OT G-codes **NOT FOR INPATIENT CLASS** Functional Assessment Tool Used: clinical judgement Functional Limitation: Self care Self Care Current Status (J0093): At least 1 percent but less than 20 percent impaired, limited or restricted Self Care Goal Status (G1829): At least 1  percent but less than 20 percent impaired, limited or restricted  Glenford Peers 04/05/2015, 10:35 AM  [phone]

## 2015-04-05 NOTE — Evaluation (Addendum)
Physical Therapy Evaluation Patient Details Name: Chase [name] MRN: [id number] DOB: 1951-04-17 Today's Date: 04/05/2015   History of Present Illness  Pt is a 64 y/o male who presents s/p C4-C6 ACDF on 04/04/15.  Clinical Impression  Pt admitted with above diagnosis. Pt currently with functional limitations due to the deficits listed below (see PT Problem List). At the time of PT eval pt was able to perform transfers and ambulation with min assist for balance/unsteadiness. Pt and son report that gait and posture has improved, however feel this patient could benefit from use of RW in the community to decrease risk of falls. Pt will benefit from skilled PT to increase their independence and safety with mobility to allow discharge to the venue listed below.       Follow Up Recommendations Supervision for mobility/OOB; Outpatient PT    Equipment Recommendations  Rolling walker with 5" wheels;3in1 (PT)    Recommendations for Other Services       Precautions / Restrictions Precautions Precautions: Fall;Cervical Precaution Comments: Handout issued - per RN will print out precautions sheet in Spanish with discharge instructions.  Restrictions Weight Bearing Restrictions: No      Mobility  Bed Mobility Overal bed mobility: Needs Assistance Bed Mobility: Rolling;Sidelying to Sit Rolling: Supervision Sidelying to sit: Min guard       General bed mobility comments: Multimodal cues for log roll technique. Son assisted with giving directions due to language barrier.   Transfers Overall transfer level: Needs assistance Equipment used: Straight cane Transfers: Sit to/from Stand Sit to Stand: Min assist         General transfer comment: Initial assist required to gain/maintain balance as pt powered-up to full stand.   Ambulation/Gait Ambulation/Gait assistance: Min assist Ambulation Distance (Feet): 400 Feet Assistive device: Straight cane Gait Pattern/deviations: Step-through  pattern;Decreased stride length Gait velocity: Decreased Gait velocity interpretation: Below normal speed for age/gender General Gait Details: Frequent assist to maintain balance as pt ambulated in hall. Pt often holding cane up in the air as he walked, and did not able to make corrective changes with cueing.   Stairs            Wheelchair Mobility    Modified Rankin (Stroke Patients Only)       Balance Overall balance assessment: Needs assistance Sitting-balance support: Feet supported;No upper extremity supported Sitting balance-Leahy Scale: Fair     Standing balance support: Single extremity supported;During functional activity Standing balance-Leahy Scale: Poor                               Pertinent Vitals/Pain Pain Assessment: 0-10 Pain Score: 5  Pain Location: Neck Pain Descriptors / Indicators: Operative site guarding Pain Intervention(s): Limited activity within patient's tolerance;Monitored during session;Repositioned    Home Living Family/patient expects to be discharged to:: Private residence Living Arrangements: Spouse/significant other;Children Available Help at Discharge: Family;Available 24 hours/day Type of Home: House Home Access: Level entry     Home Layout: One level Home Equipment: Cane - single point;Toilet riser Additional Comments: Pt's son reports that pt has taken and left some of his DME to [place] recently    Prior Function Level of Independence: Independent with assistive device(s)         Comments: Used cane all the time. Was able to complete ADL's without assist.      Hand Dominance        Extremity/Trunk Assessment   Upper Extremity Assessment: RUE  deficits/detail RUE Deficits / Details: Pt states weakness in RUE is new. Pt with limited active shoulder flexion, and was consistently straining to try and raise arms up even with cueing to maintain cervical precautions.          Lower Extremity Assessment:  Generalized weakness      Cervical / Trunk Assessment: Normal  Communication   Communication: Prefers language other than English (Spanish)  Cognition Arousal/Alertness: Awake/alert Behavior During Therapy: WFL for tasks assessed/performed Overall Cognitive Status: Within Functional Limits for tasks assessed       Memory: Decreased recall of precautions              General Comments      Exercises        Assessment/Plan    PT Assessment Patient needs continued PT services  PT Diagnosis Difficulty walking;Acute pain   PT Problem List Decreased strength;Decreased range of motion;Decreased activity tolerance;Decreased balance;Decreased mobility;Decreased knowledge of use of DME;Decreased safety awareness;Decreased knowledge of precautions;Pain  PT Treatment Interventions DME instruction;Gait training;Stair training;Functional mobility training;Therapeutic activities;Therapeutic exercise;Neuromuscular re-education;Patient/family education   PT Goals (Current goals can be found in the Care Plan section) Acute Rehab PT Goals Patient Stated Goal: Pt did not state goals at d/c PT Goal Formulation: With patient/family Time For Goal Achievement: 04/12/15 Potential to Achieve Goals: Good    Frequency Min 5X/week   Barriers to discharge        Co-evaluation               End of Session Equipment Utilized During Treatment: None Activity Tolerance: Patient tolerated treatment well Patient left: in chair;with call bell/phone within reach;with family/visitor present Nurse Communication: Mobility status    Functional Assessment Tool Used: Clinical judgement Functional Limitation: Mobility: Walking and moving around Mobility: Walking and Moving Around Current Status [phone]): At least 1 percent but less than 20 percent impaired, limited or restricted Mobility: Walking and Moving Around Goal Status [phone]): At least 1 percent but less than 20 percent impaired, limited or  restricted    Time: 0755-0822 PT Time Calculation (min) (ACUTE ONLY): 27 min   Charges:   PT Evaluation $Initial PT Evaluation Tier I: 1 Procedure PT Treatments $Gait Training: 8-22 mins   PT G Codes:   PT G-Codes **NOT FOR INPATIENT CLASS** Functional Assessment Tool Used: Clinical judgement Functional Limitation: Mobility: Walking and moving around Mobility: Walking and Moving Around Current Status (T5176): At least 1 percent but less than 20 percent impaired, limited or restricted Mobility: Walking and Moving Around Goal Status [phone]): At least 1 percent but less than 20 percent impaired, limited or restricted    Rolinda Roan 04/05/2015, 9:14 AM   Rolinda Roan, PT, DPT Acute Rehabilitation Services Pager: [phone]

## 2015-04-05 NOTE — Discharge Instructions (Signed)
Discectoma cervical anterior y fusin (Anterior Cervical Diskectomy and Fusion) La discectoma cervical anterior y fusin es una ciruga que se realiza en el cuello (columna vertebral cervical) para descomprimir los nervios de la mdula espinal. Se realiza en la parte frontal (anterior) del cuello. Durante la [place], se extirpa el disco lesionado que causa dolor, adormecimiento o debilidad. La zona en la que se extirp el disco se rellena con un implante espaciador plstico, un injerto seo, o ambos. Estos implantes e injertos seos descomprimen los nervios y la mdula espinal al hacer ms espacio para que los nervios se alejen de la columna. INFORME A SU MDICO:  Cualquier alergia que tenga.  Todos los [employer], incluidos vitaminas, hierbas, gotas oftlmicas, cremas y medicamentos de venta libre.  Problemas previos que usted o los [employer] de su familia hayan tenido con el uso de anestsicos.  Enfermedades de la sangre que tenga.  Si tiene cirugas previas.  Si tiene [employer]. RIESGOS Y COMPLICACIONES En general, se trata de un procedimiento seguro. Sin embargo, pueden presentarse problemas, por ejemplo:  Infeccin.  Sangrado y posible necesidad de una transfusin.  Lesin en las estructuras circundantes, incluidos los nervios.  Prdida de lquido del cerebro o la mdula espinal (lquido cefalorraqudeo).  Cogulos sanguneos.  Dificultades respiratorias temporarias despus de la ciruga. ANTES DEL [place] indicaciones del mdico respecto de las restricciones para las comidas o las bebidas.  Consulte a su mdico acerca de estos temas:  Cambiar o suspender los medicamentos que toma habitualmente. Esto es muy importante si toma medicamentos para la diabetes o anticoagulantes.  Tomar medicamentos, como aspirina e ibuprofeno. Estos medicamentos pueden tener un efecto anticoagulante en la [place]. No tome estos medicamentos antes del  procedimiento si el mdico le indica que no lo haga.  Pueden indicarle antibiticos para prevenir infecciones.  Es posible que Database administrator de la incisin en el cuello. PROCEDIMIENTO  Se colocar una va intravenosa (IV) en una de las venas.  Podrn administrarle uno o ms de los siguientes medicamentos:  Un medicamento para ayudarlo a Nurse, children's (sedante).  Un medicamento que lo har dormir (anestesia general).  Le colocarn un tubo para que Marine scientist.  Le limpiarn el cuello con una solucin que destruir las bacterias (antisptico).  El cirujano le har una incisin en la parte frontal del cuello, habitualmente en la lnea de un pliegue de piel.  Le separarn los msculos del cuello y le extirparn el disco lesionado y los osteofitos.  La zona en la que se extirp el disco se rellenar con un pequeo implante espaciador plstico, un injerto seo, o ambos.  El cirujano puede colocarle tornillos y placas de metal en el cuello. Esto ayuda a Research scientist (medical) sitio quirrgico y fija los implantes e injertos seos en TEFL teacher. Estos dispositivos mdicos reducen Actuary en el sitio quirrgico para que los huesos puedan unirse (fusionarse), lo que proporciona un soporte adicional al cuello.  La incisin se cerrar con puntos (suturas).  Se colocar una venda (vendaje) para cubrir la incisin. Este procedimiento puede variar segn el mdico y el hospital. DESPUS DEL PROCEDIMIENTO  Marin Comment controlarn con frecuencia la presin arterial, la frecuencia cardaca, la frecuencia respiratoria y Retail buyer de oxgeno en la sangre hasta que haya desaparecido el efecto de los medicamentos administrados.  Lo supervisarn para detectar cualquier complicacin del procedimiento, incluidas las siguientes:  Demasiado sangrado del lugar de la incisin.  Una acumulacin de sangre debajo de la piel  en el sitio quirrgico.  Dificultad para respirar.  Pueden seguir administrndole  antibiticos.  Puede comenzar a comer cuando se sienta cmodo para hacerlo.  Es posible que le den un soporte para el cuello, para usar despus de la [place]. Este soporte Environmental consultant movimiento del cuello mientras los huesos se [place]. Siga las indicaciones del mdico acerca de la frecuencia con la que debe usarlo y [place].   Esta informacin no tiene Marine scientist el consejo del mdico. Asegrese de hacerle al mdico cualquier pregunta que tenga.   Document Released: 09/13/2012 Document Revised: 06/09/2014 Elsevier Interactive Patient Education 2016 [place] Patient Information 2012 [place].

## 2015-04-05 NOTE — Progress Notes (Signed)
Pt doing well. D/C instructions given to Pt and family, interpreter services used for teaching. Pt's incision is clean and dry with no sign of infection. Pt's IV was removed prior to D/C. Pt D/C'd home via wheelchair @ 1545 per MD order. Pt is stable @ D/C and has no other needs at this time. Holli Humbles, RN

## 2015-04-05 NOTE — Discharge Summary (Signed)
  Physician Discharge Summary  Patient ID: [name] MRN: [id number] DOB/AGE: 01/01/1951 65 y.o.  Admit date: 04/04/2015 Discharge date: 04/05/2015  Admission Diagnoses:HNP cervical with myelopathy  Discharge Diagnoses:  Active Problems:   HNP (herniated nucleus pulposus) with myelopathy, cervical   Discharged Condition: good  Hospital Course: [name] was admitted after an ACDF at C4/5,5/6. He complains of some numbness in the right upper extremity. The C6 root sleeve was opened during the case, motor function is intact. His wound is clean, dry, and without signs of infection. He is voiding, ambulating, and tolerating a regular diet. Voice is strong. He is moving all extremities well  Treatments: surgery:Anterior Cervical decompression C4/5,5/6 Arthrodesis C4-6 with 38mm structural allograft x 2 Anterior instrumentation(Nuvasive) C4-6   Discharge Exam: Blood pressure 111/65, pulse 72, temperature 98.4 F (36.9 C), temperature source Oral, resp. rate 18, height 5\' 7"  (1.702 m), weight 70.9 kg (156 lb 4.9 oz), SpO2 99 %. General appearance: alert, cooperative, appears stated age and no distress Neurologic: Mental status: Alert, oriented, thought content appropriate Motor: moving all extremities well  Disposition: Final discharge disposition not confirmed cervical spondylosis with myelopathy    Medication List    TAKE these medications        cyclobenzaprine 10 MG tablet  Commonly known as:  FLEXERIL  Take 1 tablet (10 mg total) by mouth 3 (three) times daily as needed for muscle spasms.     FISH OIL PO  Take 1 capsule by mouth 2 (two) times daily.     glyBURIDE-metformin 5-500 MG tablet  Commonly known as:  GLUCOVANCE  Take 1 tablet by mouth 2 (two) times daily.     lisinopril 5 MG tablet  Commonly known as:  PRINIVIL,ZESTRIL  Take 5 mg by mouth daily.     oxyCODONE-acetaminophen 5-325 MG tablet  Commonly known as:  ROXICET  Take 1 tablet by mouth every 6 (six) hours  as needed for severe pain.     polyethylene glycol packet  Commonly known as:  MIRALAX / GLYCOLAX  Take 17 g by mouth daily as needed for moderate constipation.     pravastatin 40 MG tablet  Commonly known as:  PRAVACHOL  Take 40 mg by mouth daily.           Follow-up Information    Follow up with Dalayna Lauter L, MD In 3 weeks.   Specialty:  Neurosurgery   Why:  call the office to make an appointment   Contact information:   [address] [place] [address] [phone]       Signed: Winfield Cunas 04/05/2015, 2:24 PM

## 2015-07-20 ENCOUNTER — Other Ambulatory Visit: Payer: Self-pay | Admitting: Neurosurgery

## 2015-07-24 ENCOUNTER — Encounter (HOSPITAL_COMMUNITY): Payer: Self-pay

## 2015-07-24 ENCOUNTER — Encounter (HOSPITAL_COMMUNITY)
Admission: RE | Admit: 2015-07-24 | Discharge: 2015-07-24 | Disposition: A | Discharge status: Home / Self Care | Payer: Medicaid Other | Source: Ambulatory Visit | Attending: Neurosurgery | Admitting: Neurosurgery

## 2015-07-24 DIAGNOSIS — Z01812 Encounter for preprocedural laboratory examination: Secondary | ICD-10-CM | POA: Diagnosis present

## 2015-07-24 DIAGNOSIS — Z0183 Encounter for blood typing: Secondary | ICD-10-CM | POA: Diagnosis not present

## 2015-07-24 DIAGNOSIS — M4310 Spondylolisthesis, site unspecified: Secondary | ICD-10-CM | POA: Insufficient documentation

## 2015-07-24 HISTORY — DX: Unspecified osteoarthritis, unspecified site: M19.90

## 2015-07-24 HISTORY — DX: Unilateral inguinal hernia, without obstruction or gangrene, not specified as recurrent: K40.90

## 2015-07-24 HISTORY — DX: Headache: R51

## 2015-07-24 HISTORY — DX: Headache, unspecified: R51.9

## 2015-07-24 LAB — CBC
HCT: 51 % (ref 39.0–52.0)
Hemoglobin: 16.8 g/dL (ref 13.0–17.0)
MCH: 30 pg (ref 26.0–34.0)
MCHC: 32.9 g/dL (ref 30.0–36.0)
MCV: 91.1 fL (ref 78.0–100.0)
Platelets: 202 10*3/uL (ref 150–400)
RBC: 5.6 MIL/uL (ref 4.22–5.81)
RDW: 13.7 % (ref 11.5–15.5)
WBC: 8.7 10*3/uL (ref 4.0–10.5)

## 2015-07-24 LAB — BASIC METABOLIC PANEL
Anion gap: 8 (ref 5–15)
BUN: 14 mg/dL (ref 6–20)
CO2: 25 mmol/L (ref 22–32)
Calcium: 10.1 mg/dL (ref 8.9–10.3)
Chloride: 106 mmol/L (ref 101–111)
Creatinine, Ser: 0.67 mg/dL (ref 0.61–1.24)
GFR calc Af Amer: >60 mL/min (ref >60)
GFR calc non Af Amer: >60 mL/min (ref >60)
Glucose, Bld: 128 mg/dL — ABNORMAL HIGH (ref 65–99)
Potassium: 4.3 mmol/L (ref 3.5–5.1)
Sodium: 139 mmol/L (ref 135–145)

## 2015-07-24 LAB — TYPE AND SCREEN
ABO/RH(D): A POS
Antibody Screen: NEGATIVE

## 2015-07-24 LAB — GLUCOSE, CAPILLARY: Glucose-Capillary: 104 mg/dL — ABNORMAL HIGH (ref 65–99)

## 2015-07-24 LAB — SURGICAL PCR SCREEN
MRSA, PCR: NEGATIVE
Staphylococcus aureus: NEGATIVE

## 2015-07-24 LAB — ABO/RH: ABO/RH(D): A POS

## 2015-07-24 NOTE — Pre-Procedure Instructions (Addendum)
Chase [name]  07/24/2015      [facility] [address], [place] [address] [place] [address] Phone: [phone] Fax: [phone]    Your procedure is scheduled on 08/01/15  Report to [facility] Admitting at 700 A.M.  Call this number if you have problems the morning of surgery:  [phone]   Remember:  Do not eat food or drink liquids after midnight.  Take these medicines the morning of surgery with A SIP OF WATER NONE  STOP all herbel meds, nsaids (aleve,naproxen,advil,ibuprofen) 5 days prior to surgery starting 07/27/15 including aspirin, vitamins, fish oil  No glucovance am of surgery  How to Manage Your Diabetes Before Surgery   Why is it important to control my blood sugar before and after surgery?   Improving blood sugar levels before and after surgery helps healing and can limit problems.  A way of improving blood sugar control is eating a healthy diet by:  - Eating less sugar and carbohydrates  - Increasing activity/exercise  - Talk with your doctor about reaching your blood sugar goals  High blood sugars (greater than 180 mg/dL) can raise your risk of infections and slow down your recovery so you will need to focus on controlling your diabetes during the weeks before surgery.  Make sure that the doctor who takes care of your diabetes knows about your planned surgery including the date and location.  How do I manage my blood sugars before surgery?   Check your blood sugar at least 4 times a day, 2 days before surgery to make sure that they are not too high or low.   Check your blood sugar the morning of your surgery when you wake up and every 2               hours until you get to the Short-Stay unit.  If your blood sugar is less than 70 mg/dL, you will need to treat for low blood sugar by:  Treat a low blood sugar (less than 70 mg/dL) with 1/2 cup of clear juice (cranberry or apple), 4 glucose tablets, OR glucose  gel.  Recheck blood sugar in 15 minutes after treatment (to make sure it is greater than 70 mg/dL).  If blood sugar is not greater than 70 mg/dL on re-check, call [phone] for further instructions.   Report your blood sugar to the Short-Stay nurse when you get to Short-Stay.  References:  [facility], 2007 "How to Manage your Diabetes Before and After Surgery".  What do I do about my diabetes medications?    Do not take oral diabetes medicines (pills) the morning of surgery.(glucovance        Do not wear jewelry, make-up or nail polish.  Do not wear lotions, powders, or perfumes.  You may wear deodorant.  Do not shave 48 hours prior to surgery.  Men may shave face and neck.  Do not bring valuables to the hospital.  [facility] is not responsible for any belongings or valuables.  Contacts, dentures or bridgework may not be worn into surgery.  Leave your suitcase in the car.  After surgery it may be brought to your room.  For patients admitted to the hospital, discharge time will be determined by your treatment team.  Patients discharged the day of surgery will not be allowed to drive home.   Name and phone number of your driver:    Special instructions:  Special Instructions: [facility] - Preparing for Surgery  Before surgery, you can play an important role.  Because skin is not sterile, your skin needs to be as free of germs as possible.  You can reduce the number of germs on you skin by washing with CHG (chlorahexidine gluconate) soap before surgery.  CHG is an antiseptic cleaner which kills germs and bonds with the skin to continue killing germs even after washing.  Please DO NOT use if you have an allergy to CHG or antibacterial soaps.  If your skin becomes reddened/irritated stop using the CHG and inform your nurse when you arrive at Short Stay.  Do not shave (including legs and underarms) for at least 48 hours prior to the first CHG shower.   You may shave your face.  Please follow these instructions carefully:   1.  Shower with CHG Soap the night before surgery and the morning of Surgery.  2.  If you choose to wash your hair, wash your hair first as usual with your normal shampoo.  3.  After you shampoo, rinse your hair and body thoroughly to remove the Shampoo.  4.  Use CHG as you would any other liquid soap.  You can apply chg directly  to the skin and wash gently with scrungie or a clean washcloth.  5.  Apply the CHG Soap to your body ONLY FROM THE NECK DOWN.  Do not use on open wounds or open sores.  Avoid contact with your eyes ears, mouth and genitals (private parts).  Wash genitals (private parts)       with your normal soap.  6.  Wash thoroughly, paying special attention to the area where your surgery will be performed.  7.  Thoroughly rinse your body with warm water from the neck down.  8.  DO NOT shower/wash with your normal soap after using and rinsing off the CHG Soap.  9.  Pat yourself dry with a clean towel.            10.  Wear clean pajamas.            11.  Place clean sheets on your bed the night of your first shower and do not sleep with pets.  Day of Surgery  Do not apply any lotions/deodorants the morning of surgery.  Please wear clean clothes to the hospital/surgery center.  Please read over the following fact sheets that you were given. Pain Booklet, Coughing and Deep Breathing, Blood Transfusion Information, MRSA Information and Surgical Site Infection Prevention

## 2015-07-25 LAB — HEMOGLOBIN A1C
Hgb A1c MFr Bld: 7 % — ABNORMAL HIGH (ref 4.8–5.6)
Mean Plasma Glucose: 154 mg/dL

## 2015-08-01 ENCOUNTER — Inpatient Hospital Stay (HOSPITAL_COMMUNITY)
Admission: RE | Admit: 2015-08-01 | Discharge: 2015-08-07 | DRG: 460 | Disposition: A | Discharge status: Home / Self Care | Payer: Medicaid Other | Source: Ambulatory Visit | Attending: Neurosurgery | Admitting: Neurosurgery

## 2015-08-01 ENCOUNTER — Inpatient Hospital Stay (HOSPITAL_COMMUNITY): Payer: Medicaid Other | Admitting: Certified Registered Nurse Anesthetist

## 2015-08-01 ENCOUNTER — Inpatient Hospital Stay (HOSPITAL_COMMUNITY): Payer: Medicaid Other

## 2015-08-01 ENCOUNTER — Encounter (HOSPITAL_COMMUNITY)
Admission: RE | Disposition: A | Discharge status: Home / Self Care | Payer: Self-pay | Source: Ambulatory Visit | Attending: Neurosurgery

## 2015-08-01 ENCOUNTER — Encounter (HOSPITAL_COMMUNITY): Payer: Self-pay | Admitting: *Deleted

## 2015-08-01 DIAGNOSIS — F17200 Nicotine dependence, unspecified, uncomplicated: Secondary | ICD-10-CM | POA: Diagnosis present

## 2015-08-01 DIAGNOSIS — G9741 Accidental puncture or laceration of dura during a procedure: Secondary | ICD-10-CM | POA: Diagnosis not present

## 2015-08-01 DIAGNOSIS — K5900 Constipation, unspecified: Secondary | ICD-10-CM | POA: Diagnosis present

## 2015-08-01 DIAGNOSIS — E7800 Pure hypercholesterolemia, unspecified: Secondary | ICD-10-CM | POA: Diagnosis present

## 2015-08-01 DIAGNOSIS — Y753 Surgical instruments, materials and neurological devices (including sutures) associated with adverse incidents: Secondary | ICD-10-CM | POA: Diagnosis not present

## 2015-08-01 DIAGNOSIS — Z419 Encounter for procedure for purposes other than remedying health state, unspecified: Secondary | ICD-10-CM

## 2015-08-01 DIAGNOSIS — E119 Type 2 diabetes mellitus without complications: Secondary | ICD-10-CM | POA: Diagnosis present

## 2015-08-01 DIAGNOSIS — I10 Essential (primary) hypertension: Secondary | ICD-10-CM | POA: Diagnosis present

## 2015-08-01 DIAGNOSIS — M545 Low back pain: Secondary | ICD-10-CM | POA: Diagnosis present

## 2015-08-01 DIAGNOSIS — M5126 Other intervertebral disc displacement, lumbar region: Secondary | ICD-10-CM | POA: Diagnosis present

## 2015-08-01 DIAGNOSIS — M4726 Other spondylosis with radiculopathy, lumbar region: Secondary | ICD-10-CM | POA: Diagnosis present

## 2015-08-01 DIAGNOSIS — M4806 Spinal stenosis, lumbar region: Secondary | ICD-10-CM | POA: Diagnosis present

## 2015-08-01 DIAGNOSIS — F329 Major depressive disorder, single episode, unspecified: Secondary | ICD-10-CM | POA: Diagnosis present

## 2015-08-01 DIAGNOSIS — Y658 Other specified misadventures during surgical and medical care: Secondary | ICD-10-CM | POA: Diagnosis not present

## 2015-08-01 DIAGNOSIS — Z981 Arthrodesis status: Secondary | ICD-10-CM | POA: Diagnosis not present

## 2015-08-01 DIAGNOSIS — M1990 Unspecified osteoarthritis, unspecified site: Secondary | ICD-10-CM | POA: Diagnosis present

## 2015-08-01 DIAGNOSIS — M5136 Other intervertebral disc degeneration, lumbar region: Principal | ICD-10-CM | POA: Diagnosis present

## 2015-08-01 LAB — GLUCOSE, CAPILLARY
Glucose-Capillary: 117 mg/dL — ABNORMAL HIGH (ref 65–99)
Glucose-Capillary: 228 mg/dL — ABNORMAL HIGH (ref 65–99)

## 2015-08-01 SURGERY — POSTERIOR LUMBAR FUSION 2 LEVEL
Anesthesia: General | Site: Spine Lumbar

## 2015-08-01 MED ORDER — ROCURONIUM BROMIDE 100 MG/10ML IV SOLN
INTRAVENOUS | Status: DC | PRN
  Administered 2015-08-01: 20 mg via INTRAVENOUS
  Administered 2015-08-01 (×2): 10 mg via INTRAVENOUS
  Administered 2015-08-01: 20 mg via INTRAVENOUS
  Administered 2015-08-01: 50 mg via INTRAVENOUS
  Administered 2015-08-01: 10 mg via INTRAVENOUS
  Administered 2015-08-01: 20 mg via INTRAVENOUS
  Administered 2015-08-01: 30 mg via INTRAVENOUS

## 2015-08-01 MED ORDER — PROPOFOL 10 MG/ML IV BOLUS
INTRAVENOUS | Status: AC
  Filled 2015-08-01: qty 20

## 2015-08-01 MED ORDER — OXYCODONE HCL 5 MG PO TABS: 5.0000 mg | ORAL_TABLET | Freq: Once | ORAL | Status: DC | PRN

## 2015-08-01 MED ORDER — OMEGA-3-ACID ETHYL ESTERS 1 G PO CAPS
2.0000 g | ORAL_CAPSULE | Freq: Two times a day (BID) | ORAL | Status: DC
  Administered 2015-08-01 – 2015-08-07 (×12): 2 g via ORAL
  Filled 2015-08-01 (×12): qty 2

## 2015-08-01 MED ORDER — OXYCODONE-ACETAMINOPHEN 5-325 MG PO TABS
1.0000 | ORAL_TABLET | ORAL | Status: DC | PRN
  Administered 2015-08-01 – 2015-08-03 (×7): 2 via ORAL
  Administered 2015-08-04: 1 via ORAL
  Administered 2015-08-05 – 2015-08-07 (×5): 2 via ORAL
  Filled 2015-08-01 (×6): qty 2
  Filled 2015-08-01: qty 1
  Filled 2015-08-01 (×6): qty 2

## 2015-08-01 MED ORDER — POTASSIUM CHLORIDE IN NACL 20-0.9 MEQ/L-% IV SOLN
INTRAVENOUS | Status: DC
  Administered 2015-08-01: 80 mL/h via INTRAVENOUS
  Administered 2015-08-02 – 2015-08-03 (×2): via INTRAVENOUS
  Filled 2015-08-01 (×14): qty 1000

## 2015-08-01 MED ORDER — KETOROLAC TROMETHAMINE 30 MG/ML IJ SOLN
INTRAMUSCULAR | Status: AC
  Filled 2015-08-01: qty 1

## 2015-08-01 MED ORDER — HYDROMORPHONE HCL 1 MG/ML IJ SOLN: 0.2500 mg | INTRAMUSCULAR | Status: DC | PRN

## 2015-08-01 MED ORDER — OXYCODONE HCL 5 MG/5ML PO SOLN: 5.0000 mg | Freq: Once | ORAL | Status: DC | PRN

## 2015-08-01 MED ORDER — GLYBURIDE-METFORMIN 5-500 MG PO TABS: 1.0000 | ORAL_TABLET | Freq: Three times a day (TID) | ORAL | Status: DC

## 2015-08-01 MED ORDER — BISACODYL 5 MG PO TBEC
5.0000 mg | DELAYED_RELEASE_TABLET | Freq: Every day | ORAL | Status: DC | PRN
  Administered 2015-08-02: 5 mg via ORAL
  Filled 2015-08-01 (×2): qty 1

## 2015-08-01 MED ORDER — FENTANYL CITRATE (PF) 250 MCG/5ML IJ SOLN
INTRAMUSCULAR | Status: AC
  Filled 2015-08-01: qty 5

## 2015-08-01 MED ORDER — CEFAZOLIN SODIUM-DEXTROSE 2-3 GM-% IV SOLR
INTRAVENOUS | Status: AC
  Filled 2015-08-01: qty 50

## 2015-08-01 MED ORDER — SUGAMMADEX SODIUM 200 MG/2ML IV SOLN
INTRAVENOUS | Status: DC | PRN
Start: 2015-08-01 — End: 2015-08-01
  Administered 2015-08-01: 130 mg via INTRAVENOUS

## 2015-08-01 MED ORDER — SODIUM CHLORIDE 0.9% FLUSH: 3.0000 mL | INTRAVENOUS | Status: DC | PRN

## 2015-08-01 MED ORDER — BUPIVACAINE HCL (PF) 0.5 % IJ SOLN
INTRAMUSCULAR | Status: DC | PRN
  Administered 2015-08-01: 30 mL

## 2015-08-01 MED ORDER — ONDANSETRON HCL 4 MG/2ML IJ SOLN: 4.0000 mg | Freq: Four times a day (QID) | INTRAMUSCULAR | Status: DC | PRN

## 2015-08-01 MED ORDER — ALBUMIN HUMAN 5 % IV SOLN
INTRAVENOUS | Status: DC | PRN
  Administered 2015-08-01 (×2): via INTRAVENOUS

## 2015-08-01 MED ORDER — METFORMIN HCL 500 MG PO TABS
500.0000 mg | ORAL_TABLET | Freq: Three times a day (TID) | ORAL | Status: DC
  Administered 2015-08-01 – 2015-08-07 (×14): 500 mg via ORAL
  Filled 2015-08-01 (×16): qty 1

## 2015-08-01 MED ORDER — FENTANYL CITRATE (PF) 100 MCG/2ML IJ SOLN
INTRAMUSCULAR | Status: DC | PRN
  Administered 2015-08-01 (×3): 50 ug via INTRAVENOUS
  Administered 2015-08-01: 150 ug via INTRAVENOUS
  Administered 2015-08-01 (×4): 50 ug via INTRAVENOUS

## 2015-08-01 MED ORDER — ACETAMINOPHEN 650 MG RE SUPP: 650.0000 mg | RECTAL | Status: DC | PRN

## 2015-08-01 MED ORDER — SENNOSIDES-DOCUSATE SODIUM 8.6-50 MG PO TABS
1.0000 | ORAL_TABLET | Freq: Every evening | ORAL | Status: DC | PRN
  Administered 2015-08-04 – 2015-08-05 (×2): 1 via ORAL
  Filled 2015-08-01 (×2): qty 1

## 2015-08-01 MED ORDER — LIDOCAINE HCL (CARDIAC) 20 MG/ML IV SOLN
INTRAVENOUS | Status: AC
  Filled 2015-08-01: qty 5

## 2015-08-01 MED ORDER — HYDROCODONE-ACETAMINOPHEN 5-325 MG PO TABS
1.0000 | ORAL_TABLET | ORAL | Status: DC | PRN
  Administered 2015-08-03: 2 via ORAL
  Filled 2015-08-01: qty 2

## 2015-08-01 MED ORDER — GLYBURIDE 5 MG PO TABS
5.0000 mg | ORAL_TABLET | Freq: Three times a day (TID) | ORAL | Status: DC
  Administered 2015-08-02 – 2015-08-07 (×10): 5 mg via ORAL
  Filled 2015-08-01 (×22): qty 1

## 2015-08-01 MED ORDER — LIDOCAINE HCL (CARDIAC) 20 MG/ML IV SOLN
INTRAVENOUS | Status: DC | PRN
  Administered 2015-08-01: 60 mg via INTRAVENOUS

## 2015-08-01 MED ORDER — MENTHOL 3 MG MT LOZG: 1.0000 | LOZENGE | OROMUCOSAL | Status: DC | PRN

## 2015-08-01 MED ORDER — ROCURONIUM BROMIDE 50 MG/5ML IV SOLN
INTRAVENOUS | Status: AC
  Filled 2015-08-01: qty 2

## 2015-08-01 MED ORDER — ONDANSETRON HCL 4 MG/2ML IJ SOLN
INTRAMUSCULAR | Status: AC
  Filled 2015-08-01: qty 2

## 2015-08-01 MED ORDER — THROMBIN 20000 UNITS EX SOLR
CUTANEOUS | Status: DC | PRN
  Administered 2015-08-01 (×2): via TOPICAL

## 2015-08-01 MED ORDER — ZOLPIDEM TARTRATE 5 MG PO TABS
5.0000 mg | ORAL_TABLET | Freq: Every evening | ORAL | Status: DC | PRN
  Administered 2015-08-02: 5 mg via ORAL
  Filled 2015-08-01: qty 1

## 2015-08-01 MED ORDER — ROCURONIUM BROMIDE 50 MG/5ML IV SOLN
INTRAVENOUS | Status: AC
  Filled 2015-08-01: qty 1

## 2015-08-01 MED ORDER — LIDOCAINE-EPINEPHRINE 0.5 %-1:200000 IJ SOLN
INTRAMUSCULAR | Status: DC | PRN
  Administered 2015-08-01: 30 mL

## 2015-08-01 MED ORDER — PHENOL 1.4 % MT LIQD: 1.0000 | OROMUCOSAL | Status: DC | PRN

## 2015-08-01 MED ORDER — ARTIFICIAL TEARS OP OINT
TOPICAL_OINTMENT | OPHTHALMIC | Status: AC
  Filled 2015-08-01: qty 3.5

## 2015-08-01 MED ORDER — DIAZEPAM 5 MG PO TABS
5.0000 mg | ORAL_TABLET | Freq: Four times a day (QID) | ORAL | Status: DC | PRN
  Administered 2015-08-01 – 2015-08-02 (×2): 5 mg via ORAL
  Filled 2015-08-01 (×2): qty 1

## 2015-08-01 MED ORDER — MIDAZOLAM HCL 2 MG/2ML IJ SOLN
INTRAMUSCULAR | Status: AC
  Filled 2015-08-01: qty 2

## 2015-08-01 MED ORDER — KETOROLAC TROMETHAMINE 30 MG/ML IJ SOLN
30.0000 mg | Freq: Four times a day (QID) | INTRAMUSCULAR | Status: AC
  Administered 2015-08-01 – 2015-08-06 (×20): 30 mg via INTRAVENOUS
  Filled 2015-08-01 (×19): qty 1

## 2015-08-01 MED ORDER — PHENYLEPHRINE HCL 10 MG/ML IJ SOLN
10.0000 mg | INTRAVENOUS | Status: DC | PRN
  Administered 2015-08-01: 20 ug/min via INTRAVENOUS

## 2015-08-01 MED ORDER — MIDAZOLAM HCL 5 MG/5ML IJ SOLN
INTRAMUSCULAR | Status: DC | PRN
  Administered 2015-08-01 (×2): 1 mg via INTRAVENOUS

## 2015-08-01 MED ORDER — ALBUTEROL SULFATE HFA 108 (90 BASE) MCG/ACT IN AERS
INHALATION_SPRAY | RESPIRATORY_TRACT | Status: DC | PRN
  Administered 2015-08-01: 4 via RESPIRATORY_TRACT
  Administered 2015-08-01: 6 via RESPIRATORY_TRACT

## 2015-08-01 MED ORDER — ARTIFICIAL TEARS OP OINT
TOPICAL_OINTMENT | OPHTHALMIC | Status: DC | PRN
  Administered 2015-08-01: 1 via OPHTHALMIC

## 2015-08-01 MED ORDER — DEXAMETHASONE SODIUM PHOSPHATE 4 MG/ML IJ SOLN
INTRAMUSCULAR | Status: AC
  Filled 2015-08-01: qty 1

## 2015-08-01 MED ORDER — CEFAZOLIN SODIUM-DEXTROSE 2-3 GM-% IV SOLR
2.0000 g | INTRAVENOUS | Status: AC
  Administered 2015-08-01 (×2): 2 g via INTRAVENOUS

## 2015-08-01 MED ORDER — LACTATED RINGERS IV SOLN
INTRAVENOUS | Status: DC
  Administered 2015-08-01 (×4): via INTRAVENOUS

## 2015-08-01 MED ORDER — DEXAMETHASONE SODIUM PHOSPHATE 10 MG/ML IJ SOLN
INTRAMUSCULAR | Status: DC | PRN
  Administered 2015-08-01: 10 mg via INTRAVENOUS

## 2015-08-01 MED ORDER — SODIUM CHLORIDE 0.9 % IV SOLN: 250.0000 mL | INTRAVENOUS | Status: DC

## 2015-08-01 MED ORDER — ONDANSETRON HCL 4 MG/2ML IJ SOLN
4.0000 mg | INTRAMUSCULAR | Status: DC | PRN
  Administered 2015-08-02 – 2015-08-03 (×3): 4 mg via INTRAVENOUS
  Filled 2015-08-01 (×3): qty 2

## 2015-08-01 MED ORDER — SUGAMMADEX SODIUM 200 MG/2ML IV SOLN
INTRAVENOUS | Status: AC
  Filled 2015-08-01: qty 2

## 2015-08-01 MED ORDER — HYDROMORPHONE HCL 1 MG/ML IJ SOLN
0.5000 mg | INTRAMUSCULAR | Status: DC | PRN
  Administered 2015-08-01 – 2015-08-02 (×2): 1 mg via INTRAVENOUS
  Filled 2015-08-01 (×3): qty 1

## 2015-08-01 MED ORDER — PRAVASTATIN SODIUM 40 MG PO TABS
40.0000 mg | ORAL_TABLET | Freq: Every day | ORAL | Status: DC
  Administered 2015-08-01 – 2015-08-07 (×7): 40 mg via ORAL
  Filled 2015-08-01 (×7): qty 1

## 2015-08-01 MED ORDER — ONDANSETRON HCL 4 MG/2ML IJ SOLN
INTRAMUSCULAR | Status: DC | PRN
  Administered 2015-08-01: 4 mg via INTRAVENOUS

## 2015-08-01 MED ORDER — ALBUTEROL SULFATE HFA 108 (90 BASE) MCG/ACT IN AERS
INHALATION_SPRAY | RESPIRATORY_TRACT | Status: AC
  Filled 2015-08-01: qty 6.7

## 2015-08-01 MED ORDER — LISINOPRIL 5 MG PO TABS
5.0000 mg | ORAL_TABLET | Freq: Every day | ORAL | Status: DC
Start: 2015-08-01 — End: 2015-08-07
  Administered 2015-08-01 – 2015-08-06 (×4): 5 mg via ORAL
  Filled 2015-08-01 (×7): qty 1

## 2015-08-01 MED ORDER — SODIUM CHLORIDE 0.9 % IV SOLN
INTRAVENOUS | Status: DC | PRN
  Administered 2015-08-01: 17:00:00 via INTRAVENOUS

## 2015-08-01 MED ORDER — DOCUSATE SODIUM 100 MG PO CAPS
100.0000 mg | ORAL_CAPSULE | Freq: Two times a day (BID) | ORAL | Status: DC
  Administered 2015-08-01 – 2015-08-07 (×11): 100 mg via ORAL
  Filled 2015-08-01 (×11): qty 1

## 2015-08-01 MED ORDER — EPHEDRINE SULFATE 50 MG/ML IJ SOLN
INTRAMUSCULAR | Status: DC | PRN
  Administered 2015-08-01: 10 mg via INTRAVENOUS

## 2015-08-01 MED ORDER — SODIUM CHLORIDE 0.9% FLUSH
3.0000 mL | Freq: Two times a day (BID) | INTRAVENOUS | Status: DC
  Administered 2015-08-01 – 2015-08-07 (×9): 3 mL via INTRAVENOUS

## 2015-08-01 MED ORDER — PROPOFOL 10 MG/ML IV BOLUS
INTRAVENOUS | Status: DC | PRN
  Administered 2015-08-01: 160 mg via INTRAVENOUS

## 2015-08-01 MED ORDER — ACETAMINOPHEN 325 MG PO TABS: 650.0000 mg | ORAL_TABLET | ORAL | Status: DC | PRN

## 2015-08-01 MED ORDER — 0.9 % SODIUM CHLORIDE (POUR BTL) OPTIME
TOPICAL | Status: DC | PRN
  Administered 2015-08-01 (×2): 1000 mL

## 2015-08-01 SURGICAL SUPPLY — 72 items
BAG DECANTER FOR FLEXI CONT (MISCELLANEOUS) IMPLANT
BENZOIN TINCTURE PRP APPL 2/3 (GAUZE/BANDAGES/DRESSINGS) ×2 IMPLANT
BIT DRILL PLIF MAS DISP 5.5MM (DRILL) ×1 IMPLANT
BLADE CLIPPER SURG (BLADE) IMPLANT
BUR MATCHSTICK NEURO 3.0 LAGG (BURR) ×2 IMPLANT
BUR PRECISION FLUTE 5.0 (BURR) ×2 IMPLANT
CANISTER SUCT 3000ML PPV (MISCELLANEOUS) ×2 IMPLANT
CONT SPEC 4OZ CLIKSEAL STRL BL (MISCELLANEOUS) ×2 IMPLANT
COVER BACK TABLE 60X90IN (DRAPES) ×2 IMPLANT
DECANTER SPIKE VIAL GLASS SM (MISCELLANEOUS) IMPLANT
DRAPE C-ARM 42X72 X-RAY (DRAPES) ×4 IMPLANT
DRAPE C-ARMOR (DRAPES) ×2 IMPLANT
DRAPE LAPAROTOMY 100X72X124 (DRAPES) ×2 IMPLANT
DRAPE POUCH INSTRU U-SHP 10X18 (DRAPES) ×2 IMPLANT
DRAPE SURG 17X23 STRL (DRAPES) ×2 IMPLANT
DRILL PLIF MAS DISP 5.5MM (DRILL) ×2
DRSG OPSITE POSTOP 4X8 (GAUZE/BANDAGES/DRESSINGS) ×2 IMPLANT
DURAGUARD 04CMX04CM ×2 IMPLANT
DURAPREP 26ML APPLICATOR (WOUND CARE) ×2 IMPLANT
DURASEAL APPLICATOR TIP (TIP) ×2 IMPLANT
DURASEAL SPINE SEALANT 3ML (MISCELLANEOUS) ×2 IMPLANT
ELECT REM PT RETURN 9FT ADLT (ELECTROSURGICAL) ×2
ELECTRODE REM PT RTRN 9FT ADLT (ELECTROSURGICAL) ×1 IMPLANT
GAUZE SPONGE 4X4 12PLY STRL (GAUZE/BANDAGES/DRESSINGS) IMPLANT
GAUZE SPONGE 4X4 16PLY XRAY LF (GAUZE/BANDAGES/DRESSINGS) ×2 IMPLANT
GLOVE BIO SURGEON STRL SZ7 (GLOVE) ×4 IMPLANT
GLOVE BIO SURGEON STRL SZ8 (GLOVE) ×2 IMPLANT
GLOVE BIOGEL PI IND STRL 7.0 (GLOVE) ×3 IMPLANT
GLOVE BIOGEL PI IND STRL 7.5 (GLOVE) ×2 IMPLANT
GLOVE BIOGEL PI INDICATOR 7.0 (GLOVE) ×3
GLOVE BIOGEL PI INDICATOR 7.5 (GLOVE) ×2
GLOVE ECLIPSE 6.5 STRL STRAW (GLOVE) ×4 IMPLANT
GLOVE INDICATOR 8.5 STRL (GLOVE) ×2 IMPLANT
GOWN STRL REUS W/ TWL LRG LVL3 (GOWN DISPOSABLE) ×3 IMPLANT
GOWN STRL REUS W/ TWL XL LVL3 (GOWN DISPOSABLE) ×3 IMPLANT
GOWN STRL REUS W/TWL 2XL LVL3 (GOWN DISPOSABLE) IMPLANT
GOWN STRL REUS W/TWL LRG LVL3 (GOWN DISPOSABLE) ×3
GOWN STRL REUS W/TWL XL LVL3 (GOWN DISPOSABLE) ×3
KIT BASIN OR (CUSTOM PROCEDURE TRAY) ×2 IMPLANT
KIT POSITION SURG JACKSON T1 (MISCELLANEOUS) ×2 IMPLANT
KIT ROOM TURNOVER OR (KITS) ×2 IMPLANT
LIQUID BAND (GAUZE/BANDAGES/DRESSINGS) ×2 IMPLANT
MILL MEDIUM DISP (BLADE) ×2 IMPLANT
NEEDLE HYPO 25X1 1.5 SAFETY (NEEDLE) ×2 IMPLANT
NEEDLE SPNL 18GX3.5 QUINCKE PK (NEEDLE) ×2 IMPLANT
NS IRRIG 1000ML POUR BTL (IV SOLUTION) ×4 IMPLANT
PACK LAMINECTOMY NEURO (CUSTOM PROCEDURE TRAY) ×2 IMPLANT
PAD ARMBOARD 7.5X6 YLW CONV (MISCELLANEOUS) ×6 IMPLANT
PATTIES SURGICAL .5 X.5 (GAUZE/BANDAGES/DRESSINGS) ×2 IMPLANT
PATTIES SURGICAL 1X1 (DISPOSABLE) ×2 IMPLANT
ROD PREBENT MAS PLIF 75MM (Rod) ×4 IMPLANT
SCREW LOCK (Screw) ×6 IMPLANT
SCREW LOCK FXNS SPNE MAS PL (Screw) ×6 IMPLANT
SCREW SHANK 5.5X30MM (Screw) ×2 IMPLANT
SCREW SHANK 5.5X40MM (Screw) ×2 IMPLANT
SCREW SHANKS 5.5X35 (Screw) ×8 IMPLANT
SCREW TULIP 5.5 (Screw) ×12 IMPLANT
SPACER OPAL 9MM (Orthopedic Implant) ×2 IMPLANT
SPACER OPAL REVOLVE 10MM ×4 IMPLANT
SPONGE LAP 4X18 X RAY DECT (DISPOSABLE) IMPLANT
SPONGE SURGIFOAM ABS GEL 100 (HEMOSTASIS) ×4 IMPLANT
STRIP CLOSURE SKIN 1/2X4 (GAUZE/BANDAGES/DRESSINGS) ×2 IMPLANT
SUT NURALON 4 0 TR CR/8 (SUTURE) ×2 IMPLANT
SUT PROLENE 6 0 BV (SUTURE) ×6 IMPLANT
SUT VIC AB 0 CT1 18XCR BRD8 (SUTURE) ×2 IMPLANT
SUT VIC AB 0 CT1 8-18 (SUTURE) ×2
SUT VIC AB 2-0 CT1 18 (SUTURE) ×4 IMPLANT
SUT VIC AB 3-0 SH 8-18 (SUTURE) ×4 IMPLANT
TOWEL OR 17X24 6PK STRL BLUE (TOWEL DISPOSABLE) ×2 IMPLANT
TOWEL OR 17X26 10 PK STRL BLUE (TOWEL DISPOSABLE) ×2 IMPLANT
TRAY FOLEY W/METER SILVER 16FR (SET/KITS/TRAYS/PACK) ×2 IMPLANT
WATER STERILE IRR 1000ML POUR (IV SOLUTION) ×2 IMPLANT

## 2015-08-01 NOTE — Anesthesia Postprocedure Evaluation (Signed)
Anesthesia Post Note  Patient: Chase [name]  Procedure(s) Performed: Procedure(s) (LRB): LUMBAR THREE-FOUR, LUMBAR FOUR-FIVE POSTERIOR LUMBAR FUSION  (N/A)  Patient location during evaluation: PACU Anesthesia Type: General Level of consciousness: awake and alert Pain management: pain level controlled Vital Signs Assessment: post-procedure vital signs reviewed and stable Respiratory status: spontaneous breathing, nonlabored ventilation, respiratory function stable and patient connected to nasal cannula oxygen Cardiovascular status: blood pressure returned to baseline and stable Postop Assessment: no signs of nausea or vomiting Anesthetic complications: no    Last Vitals:  Filed Vitals:   08/01/15 1835 08/01/15 1901  BP: 123/86 123/74  Pulse: 92 86  Temp: 36.5 C 36.9 C  Resp: 19 16    Last Pain:  Filed Vitals:   08/01/15 1902  PainSc: 7                  Gladstone Rosas,W. EDMOND
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
## 2015-08-01 NOTE — Op Note (Signed)
08/01/2015  6:19 PM  PATIENT:  Chase [name]  65 y.o. 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 2 Peek cages 10x30mm with autograft morsels L3/4, 1 9x62mm peek cage at L4/5 on the right side with autograft morsels (Synthes Opal cages) Lumbar decompression L3,4,and L5 nerve roots bilaterally via complete L3, L4 laminectomies, hemilaminectomy L5. Exposure of the neural foramina bilaterally at L3,4, and 5 well beyond the needed exposure for the PLIF Segmental posterior instrumentation Pedicle screw placement L3,4,and 5 (mas plif nuvasive)   SURGEON:  Surgeon(s): Ashok Pall, MD Kary Kos, MD  ASSISTANTS:Cram, Dominica Severin  ANESTHESIA:   general  EBL:  Total I/O In: [id number] [I.V.:3200; Blood:410; IV [id number] Out: 2530 [Urine:1030; Blood:1500]  BLOOD ADMINISTERED:410 CC CELLSAVER  CELL SAVER GIVEN:yes  COUNT:per nursing  DRAINS: none   SPECIMEN:  No Specimen  DICTATION: Cha Press is a 65 y.o. male whom was taken to the operating room intubated, and placed under a general anesthetic without difficulty. A foley catheter was placed under sterile conditions. He  was positioned prone on a Jackson table with all pressure points properly padded.  His lumbar region was prepped and draped in a sterile manner. I infiltrated 30cc's 1/2%lidocaine/1:2000,000 strength epinephrine into the planned incision. I opened the skin with a 10 blade and took the incision down to the thoracolumbar fascia. I exposed the lamina of L2,3,4, and 5 in a subperiosteal fashion bilaterally. I confirmed my location with an intraoperative xray.  I placed self retaining retractors and started the decompression.  I  decompressed the spinal canal via complete laminectomies of L3, L4, with L3 inferior facetectomies, and L4 inferior facetectomies, and a hemilaminectomy of L5. When removing the lamina of L4,the dura was opened. There was essentially an opening in the dura which I believe was present only because the nerve roots were densely adherent to the left sided edge and interior of the dura. I made linear incisions rostral and caudal to the opening which allowed me to close the dura and dissect the nerve roots away the dural interior so that I could push them back inside the theca. I closed the dura primarily with prolene sutures. I freed the nerve roots in their travel around the pedicles at L3, and L4 with the drill and Kerrison punches. The lateral recesses were unroofed at each level.  PLIF's were performed at L3/4, and 4/5 in the same fashion. I opened the disc space with a 15 blade then used a variety of instruments to remove the disc and prepare the space for the arthrodesis. I used curettes, rongeurs, punches, shavers for the disc space, and rasps in the discetomy. I measured the disc space and placed 10 x 110mm  Peek cages(Synthes Opal) into the disc space at L3/4, and a single 9x70mm cage on the left side at L4/5(s). All the cages were packed with autograft morsels, and the disc spaces were also packed with autograft morsels. We placed pedicle screws at L3, L4, and L5, using fluoroscopic guidance. I drilled a pilot hole, then cannulated the pedicle with a drill at each site. I then tapped each pedicle, assessing each site for pedicle violations. No cutouts were appreciated. Screws (nuvasive plif) were then placed at each site without difficulty. Final films were performed and all screws appeared to be  in good position. We connected the tulip heads to the shanks, placed the rods in the screw heads, then secured them with locking caps. I closed the wound in a layered fashion. I approximated the thoracolumbar fascia,  subcutaneous, and subcuticular planes with vicryl sutures. I approximated the skin edges with a running nylon suture. I used an occlusive bandage for a sterile dressing.     PLAN OF CARE: Admit to inpatient   PATIENT DISPOSITION:  PACU - hemodynamically stable.   Delay start of Pharmacological VTE agent (>24hrs) due to surgical blood loss or risk of bleeding:  yes

## 2015-08-01 NOTE — Anesthesia Preprocedure Evaluation (Addendum)
Anesthesia Evaluation  Patient identified by MRN, date of birth, ID band Patient awake    Reviewed: Allergy & Precautions, NPO status , Patient's Chart, lab work & pertinent test results  History of Anesthesia Complications Negative for: history of anesthetic complications  Airway Mallampati: II   Neck ROM: full    Dental  (+) Teeth Intact, Dental Advisory Given   Pulmonary Recent URI , Residual Cough, Current Smoker,    breath sounds clear to auscultation       Cardiovascular Exercise Tolerance: Good hypertension, Pt. on medications  Rhythm:regular Rate:Normal     Neuro/Psych  Headaches, PSYCHIATRIC DISORDERS Depression    GI/Hepatic negative GI ROS, Neg liver ROS, neg GERD  ,(+) neg Cirrhosis    (-) substance abuse  ,   Endo/Other  diabetes, Poorly Controlled, Type 2  Renal/GU negative Renal ROS     Musculoskeletal  (+) Arthritis , Osteoarthritis,    Abdominal   Peds  Hematology   Anesthesia Other Findings   Reproductive/Obstetrics                          BP Readings from Last 3 Encounters:  08/01/15 126/80  07/24/15 114/70  04/05/15 111/65   CBC    Component Value Date/Time   WBC 8.7 07/24/2015 1148   RBC 5.60 07/24/2015 1148   HGB 16.8 07/24/2015 1148   HCT 51.0 07/24/2015 1148   PLT 202 07/24/2015 1148   MCV 91.1 07/24/2015 1148   MCH 30.0 07/24/2015 1148   MCHC 32.9 07/24/2015 1148   RDW 13.7 07/24/2015 1148      Chemistry      Component Value Date/Time   NA 139 07/24/2015 1148   K 4.3 07/24/2015 1148   CL 106 07/24/2015 1148   CO2 25 07/24/2015 1148   BUN 14 07/24/2015 1148   CREATININE 0.67 07/24/2015 1148      Component Value Date/Time   CALCIUM 10.1 07/24/2015 1148     Lab Results  Component Value Date   HGBA1C 7.0* 07/24/2015   EKG 29 Mar 2015:  Normal sinus rhythm Normal ECG No previous tracing Confirmed by Claiborne Billings MD, THOMAS  Anesthesia  Physical Anesthesia Plan  ASA: II  Anesthesia Plan: General   Post-op Pain Management:    Induction: Intravenous  Airway Management Planned: Oral ETT  Additional Equipment:   Intra-op Plan:   Post-operative Plan: Extubation in OR  Informed Consent: I have reviewed the patients History and Physical, chart, labs and discussed the procedure including the risks, benefits and alternatives for the proposed anesthesia with the patient or authorized representative who has indicated his/her understanding and acceptance.     Plan Discussed with: CRNA, Anesthesiologist and Surgeon  Anesthesia Plan Comments:         Anesthesia Quick Evaluation

## 2015-08-01 NOTE — Anesthesia Procedure Notes (Addendum)
Procedure Name: Intubation Date/Time: 08/01/2015 11:38 AM Performed by: Layla Maw Pre-anesthesia Checklist: Patient identified, Emergency Drugs available, Suction available, Patient being monitored and Timeout performed Patient Re-evaluated:Patient Re-evaluated prior to inductionOxygen Delivery Method: Simple face mask Preoxygenation: Pre-oxygenation with 100% oxygen Intubation Type: IV induction Ventilation: Oral airway inserted - appropriate to patient size and Two handed mask ventilation required Laryngoscope Size: Mac and 3 Grade View: Grade IV Tube type: Oral Tube size: 8.0 mm Number of attempts: 4 Airway Equipment and Method: Stylet Placement Confirmation: positive ETCO2 and breath sounds checked- equal and bilateral Secured at: 22 cm Tube secured with: Tape Dental Injury: Teeth and Oropharynx as per pre-operative assessment  Difficulty Due To: Difficult Airway- due to anterior larynx and Difficult Airway- due to reduced neck mobility Future Recommendations: Recommend- induction with short-acting agent, and alternative techniques readily available Comments: First DL with Miller 3 --> gr 4 view. Second DL with MAC 4 --> gr 4 view and unable to pass ETT. 3rd DL by Dr. Marcie Bal with MAC 4 --> 4 view, 4th DL with MAC 3 --> gr 3/4 and successful atraumatic oral intubation by Dr. Marcie Bal. Would recommend having glidescope available for future intubations.

## 2015-08-01 NOTE — Transfer of Care (Signed)
Immediate Anesthesia Transfer of Care Note  Patient: Chase [name]  Procedure(s) Performed: Procedure(s) with comments: LUMBAR THREE-FOUR, LUMBAR FOUR-FIVE POSTERIOR LUMBAR FUSION  (N/A) - L34 L45 posterior lumbar interbody fusion with interbody prosthesis posterior lateral arthrodesis and posterior segmental instrumentation  Patient Location: PACU  Anesthesia Type:General  Level of Consciousness: awake, alert , oriented and patient cooperative  Airway & Oxygen Therapy: Patient Spontanous Breathing and Patient connected to nasal cannula oxygen  Post-op Assessment: Report given to RN, Post -op Vital signs reviewed and stable and Patient moving all extremities X 4  Post vital signs: Reviewed and stable  Last Vitals:  Filed Vitals:   08/01/15 0737  BP: 126/80  Pulse: 61  Temp: 0000000 C    Complications: No apparent anesthesia complications Translator at bedside, states pt would like a cigar.

## 2015-08-01 NOTE — Progress Notes (Signed)
Patient arrived to [facility]. Patient is alert, oriented x4, interpreter at bedside. Honey comb dressing on back is clean, dry, intact, patient on flat bed rest, foley catheter in place, pt educated on staff, unit, plan of care, verbalizes understanding. Refuses food at this time, denies nausea, states he is not hungry, in too much pain to eat. Report given to night shift rn. Will continue to monitor closely.

## 2015-08-02 LAB — GLUCOSE, CAPILLARY
Glucose-Capillary: 65 mg/dL (ref 65–99)
Glucose-Capillary: 114 mg/dL — ABNORMAL HIGH (ref 65–99)

## 2015-08-02 MED ORDER — GLUCERNA SHAKE PO LIQD
237.0000 mL | Freq: Three times a day (TID) | ORAL | Status: DC
  Administered 2015-08-02 – 2015-08-07 (×13): 237 mL via ORAL
  Filled 2015-08-02 (×19): qty 237

## 2015-08-02 NOTE — Progress Notes (Signed)
Inpatient Diabetes Program Recommendations  AACE/ADA: New Consensus Statement on Inpatient Glycemic Control (2015)  Target Ranges:  Prepandial:   less than 140 mg/dL      Peak postprandial:   less than 180 mg/dL (1-2 hours)      Critically ill patients:  140 - 180 mg/dL   Results for YORDIN, MAIS (MRN [id number]) as of 08/02/2015 09:36  Ref. Range 08/01/2015 07:39 08/01/2015 18:59  Glucose-Capillary Latest Ref Range: 65-99 mg/dL 117 (H) 228 (H)   Review of Glycemic Control  Diabetes history: DM 2 Outpatient Diabetes medications: Glucovance 5-500 TID Current orders for Inpatient glycemic control: Metformin 500 TID, Glyburide 5 TID  Inpatient Diabetes Program Recommendations: Correction (SSI): While inaptient please order CBGs and Novolog Sensitive Correction TID.  Thanks,  Tama Headings RN, MSN, [facility] Inpatient Diabetes Coordinator Team Pager [phone] (8a-5p)

## 2015-08-02 NOTE — Progress Notes (Signed)
Patient ID: Chase [name], male   DOB: 1951/05/30, 65 y.o.   MRN: [id number] BP 92/54 mmHg  Pulse 90  Temp(Src) 99 F (37.2 C) (Oral)  Resp 20  Ht 5\' 4"  (1.626 m)  Wt 69.4 kg (153 lb)  BMI 26.25 kg/m2  SpO2 99% Alert and oriented x 4, speech is clear and fluent Moving lower extremities well Wound is clean, dry, no signs of infection.  Two more days bedrest. kc

## 2015-08-02 NOTE — Care Management Note (Signed)
Case Management Note  Patient Details  Name: Chase [name] MRN: [id number] Date of Birth: [birth year]  Subjective/Objective:                    Action/Plan: Patient has HHPT/OT ordered. CM talked with Tiffany with [facility] and the patient does not have a qualifying diagnosis to receive [facility] services. CM will continue to follow for further d/c needs.   Expected Discharge Date:                  Expected Discharge Plan:     In-House Referral:     Discharge planning Services     Post Acute Care Choice:    Choice offered to:     DME Arranged:    DME Agency:     HH Arranged:    HH Agency:     Status of Service:  In process, will continue to follow  Medicare Important Message Given:    Date Medicare IM Given:    Medicare IM give by:    Date Additional Medicare IM Given:    Additional Medicare Important Message give by:     If discussed at [place] of Stay Meetings, dates discussed:    Additional Comments:  Pollie Friar, RN 08/02/2015, 3:23 PM
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
## 2015-08-02 NOTE — Care Management Note (Signed)
Case Management Note  Patient Details  Name: Chase [name] MRN: [id number] Date of Birth: 06/03/1950  Subjective/Objective:                    Action/Plan: Patient was admitted for a LUMBAR THREE-FOUR, LUMBAR FOUR-FIVE POSTERIOR LUMBAR FUSION.  Lives at home with spouse.  Will follow for discharge needs pending PT/OT evals and physician orders.  Expected Discharge Date:                  Expected Discharge Plan:     In-House Referral:     Discharge planning Services     Post Acute Care Choice:    Choice offered to:     DME Arranged:    DME Agency:     HH Arranged:    HH Agency:     Status of Service:  In process, will continue to follow  Medicare Important Message Given:    Date Medicare IM Given:    Medicare IM give by:    Date Additional Medicare IM Given:    Additional Medicare Important Message give by:     If discussed at [place] of Stay Meetings, dates discussed:    Additional Comments:  Rolm Baptise, RN 08/02/2015, 9:16 AM [phone]

## 2015-08-03 LAB — POCT I-STAT EG7
Acid-base deficit: 2 mmol/L (ref 0.0–2.0)
Bicarbonate: 24.7 mEq/L — ABNORMAL HIGH (ref 20.0–24.0)
Calcium, Ion: 1.18 mmol/L (ref 1.13–1.30)
HCT: 43 % (ref 39.0–52.0)
Hemoglobin: 14.6 g/dL (ref 13.0–17.0)
O2 Saturation: 92 %
Patient temperature: 37.5
Potassium: 4.2 mmol/L (ref 3.5–5.1)
Sodium: 139 mmol/L (ref 135–145)
TCO2: 26 mmol/L (ref 0–100)
pCO2, Ven: 49.3 mmHg (ref 45.0–50.0)
pH, Ven: 7.31 — ABNORMAL HIGH (ref 7.250–7.300)
pO2, Ven: 73 mmHg — ABNORMAL HIGH (ref 30.0–45.0)

## 2015-08-03 LAB — GLUCOSE, CAPILLARY
Glucose-Capillary: 33 mg/dL — CL (ref 65–99)
Glucose-Capillary: 52 mg/dL — ABNORMAL LOW (ref 65–99)
Glucose-Capillary: 58 mg/dL — ABNORMAL LOW (ref 65–99)
Glucose-Capillary: 44 mg/dL — CL (ref 65–99)
Glucose-Capillary: 70 mg/dL (ref 65–99)

## 2015-08-03 NOTE — Progress Notes (Signed)
Inpatient Diabetes Program Recommendations  AACE/ADA: New Consensus Statement on Inpatient Glycemic Control (2015)  Target Ranges:  Prepandial:   less than 140 mg/dL      Peak postprandial:   less than 180 mg/dL (1-2 hours)      Critically ill patients:  140 - 180 mg/dL   Review of Glycemic Control:  Results for Chase [name], Chase [name] (MRN [id number]) as of 08/03/2015 10:08  Ref. Range 08/01/2015 07:39 08/01/2015 18:59 08/02/2015 18:29 08/02/2015 21:41  Glucose-Capillary Latest Ref Range: 65-99 mg/dL 117 (H) 228 (H) 65 114 (H)    Diabetes history: Type 2 diabetes Outpatient Diabetes medications: Glyburide-Metformin 5-500 mg tid daily Current orders for Inpatient glycemic control:  Glyburide 5 mg tid with meals, Metformin 500 mg tid with meals Inpatient Diabetes Program Recommendations: Consider holding oral diabetes agents while patient is in the hospital.  Please add Novolog moderate correction tid with meals while patient is in the hospital.  Thanks, Adah Perl, RN, BC-ADM Inpatient Diabetes Coordinator Pager [phone] (8a-5p)

## 2015-08-03 NOTE — Progress Notes (Signed)
Orthopedic Tech Progress Note Patient Details:  Chase [name] August 06, 1950 [id number]  Patient ID: Chase [name], male   DOB: [birth year], 65 y.o.   MRN: [id number]   Chase [name] 08/03/2015, 8:36 [facility] [employer] for lumbar brace.

## 2015-08-03 NOTE — Progress Notes (Signed)
   Subjective:    Patient ID: Chase [name], male    DOB: 01-Jul-1950, 65 y.o.   MRN: [id number] BP 85/57 mmHg  Pulse 79  Temp(Src) 98.5 F (36.9 C) (Oral)  Resp 18  Ht 5\' 4"  (1.626 m)  Wt 153 lb (69.4 kg)  BMI 26.25 kg/m2  SpO2 100%  HPI lumbar stenosis    Review of Systems     Objective:   Physical Exam Alert and oriented x 4, speech is clear and fluent Moving lower extremities well Wound is clean, dry, no signs of infection                 Assessment & Plan:  Bedrest until tomorrow.

## 2015-08-03 NOTE — Progress Notes (Signed)
At 1630 initial cbg 33, retaken after eating 55. Retaken at 1800, 58. Notified [place] of results. Nighttime dose to be held if cbg remains low.

## 2015-08-04 NOTE — Evaluation (Signed)
Physical Therapy Evaluation Patient Details Name: Chase [name] MRN: [id number] DOB: 24-Sep-1950 Today's Date: 08/04/2015   History of Present Illness  Pt is s/p Posterior lumbar interbody arthrodesis L3/4, L4/5. with durotomy.  Recent cervical fusion 11/16.   Clinical Impression  Patient is s/p above surgery resulting in the deficits listed below (see PT Problem List). Patient will benefit from skilled PT to increase their independence and safety with mobility (while adhering to their precautions) to allow discharge to home with assist from family.       Follow Up Recommendations No PT follow up;Supervision for mobility/OOB (PT f/u as recommended by MD)    Equipment Recommendations  None recommended by PT    Recommendations for Other Services       Precautions / Restrictions Precautions Precautions: Back;Fall Precaution Booklet Issued: No Precaution Comments: Need to locate precuation sheet in Spanish if available Required Braces or Orthoses: Spinal Brace Spinal Brace: Lumbar corset;Applied in sitting position Restrictions Weight Bearing Restrictions: No      Mobility  Bed Mobility Overal bed mobility: Needs Assistance Bed Mobility: Sidelying to Sit;Sit to Sidelying   Sidelying to sit: Mod assist     Sit to sidelying: Min assist (with LE's) General bed mobility comments: instructed in log rolling  Transfers Overall transfer level: Needs assistance Equipment used: Rolling walker (2 wheeled) Transfers: Sit to/from Stand Sit to Stand: Min guard         General transfer comment: cues for safest technique  Ambulation/Gait Ambulation/Gait assistance: Min guard Ambulation Distance (Feet): 200 Feet Assistive device: Rolling walker (2 wheeled) Gait Pattern/deviations: Step-through pattern   Gait velocity interpretation: Below normal speed for age/gender General Gait Details: cues to stand up straight  Stairs            Wheelchair Mobility    Modified Rankin  (Stroke Patients Only)       Balance                                             Pertinent Vitals/Pain Pain Assessment: 0-10 Pain Score: 7  Pain Location: low back Pain Intervention(s): Limited activity within patient's tolerance;Monitored during session    Home Living Family/patient expects to be discharged to:: Private residence Living Arrangements: Spouse/significant other Available Help at Discharge: Family;Available 24 hours/day Type of Home: House Home Access: Stairs to enter   [employer] of Steps: 2 Home Layout: One level Home Equipment: Walker - 2 wheels;Cane - single point      Prior Function Level of Independence: Independent with assistive device(s)               Hand Dominance   Dominant Hand: Right    Extremity/Trunk Assessment   Upper Extremity Assessment: Defer to OT evaluation           Lower Extremity Assessment: Generalized weakness      Cervical / Trunk Assessment: Normal (cues to stand straight needed)  Communication   Communication: Prefers language other than English (Spanish)  Cognition Arousal/Alertness: Awake/alert Behavior During Therapy: WFL for tasks assessed/performed Overall Cognitive Status: Within Functional Limits for tasks assessed                      General Comments General comments (skin integrity, edema, etc.): Pt requested his adult son translate for him during session.  Did not want phone translater. Pt's wife present  during sesison as well with permission    Pt was in good spirits and was joking around with PT and his family. Encouraged pt to ambulate with the nursing staff throughout the day.      Exercises        Assessment/Plan    PT Assessment Patient needs continued PT services  PT Diagnosis Difficulty walking;Acute pain   PT Problem List Decreased mobility;Decreased knowledge of use of DME;Decreased knowledge of precautions;Pain  PT Treatment Interventions  DME instruction;Gait training;Stair training;Therapeutic exercise;Patient/family education   PT Goals (Current goals can be found in the Care Plan section) Acute Rehab PT Goals Patient Stated Goal: pt did not state goal PT Goal Formulation: With patient Time For Goal Achievement: 08/11/15 Potential to Achieve Goals: Good    Frequency Min 5X/week   Barriers to discharge        Co-evaluation               End of Session Equipment Utilized During Treatment: Gait belt;Back brace Activity Tolerance: Patient tolerated treatment well Patient left: in bed;with bed alarm set;with family/visitor present Nurse Communication: Mobility status         Time: 1430-1502 PT Time Calculation (min) (ACUTE ONLY): 32 min   Charges:   PT Evaluation $PT Eval Moderate Complexity: 1 Procedure PT Treatments $Gait Training: 8-22 mins   PT G Codes:        [name] 08/04/2015, 3:25 PM  [name], [place] 08/04/2015

## 2015-08-04 NOTE — Progress Notes (Signed)
No issues overnight. Pt has no new c/o. Sore from being flat.  EXAM:  BP 89/53 mmHg  Pulse 79  Temp(Src) 98.7 F (37.1 C) (Oral)  Resp 18  Ht 5\' 4"  (1.626 m)  Wt 69.4 kg (153 lb)  BMI 26.25 kg/m2  SpO2 100%  Awake, alert, oriented  Speech fluent, appropriate  CN grossly intact  5/5 BUE/BLE  Wound dry, no leak  IMPRESSION:  65 y.o. male POD# 3 s/p lumbar fusion with durotomy. No evidence of leak  PLAN: - Can start to mobilize today

## 2015-08-05 LAB — GLUCOSE, CAPILLARY
Glucose-Capillary: 53 mg/dL — ABNORMAL LOW (ref 65–99)
Glucose-Capillary: 63 mg/dL — ABNORMAL LOW (ref 65–99)
Glucose-Capillary: 124 mg/dL — ABNORMAL HIGH (ref 65–99)
Glucose-Capillary: 149 mg/dL — ABNORMAL HIGH (ref 65–99)
Glucose-Capillary: 100 mg/dL — ABNORMAL HIGH (ref 65–99)
Glucose-Capillary: 158 mg/dL — ABNORMAL HIGH (ref 65–99)

## 2015-08-05 MED ORDER — MAGNESIUM CITRATE PO SOLN
1.0000 | Freq: Once | ORAL | Status: AC
  Administered 2015-08-05: 1 via ORAL
  Filled 2015-08-05: qty 296

## 2015-08-05 MED ORDER — BISACODYL 5 MG PO TBEC: 5.0000 mg | DELAYED_RELEASE_TABLET | Freq: Every day | ORAL | Status: DC | PRN

## 2015-08-05 NOTE — Progress Notes (Signed)
Physical Therapy Treatment Patient Details Name: Chase [name] MRN: [id number] DOB: [birth year] Today's Date: 08/05/2015    History of Present Illness Pt is s/p Posterior lumbar interbody arthrodesis L3/4, L4/5. with durotomy.  Recent cervical fusion 11/16 and pt with DM.      PT Comments    Pt progressing well with mobility. Practiced stairs today.    Follow Up Recommendations  No PT follow up;Supervision for mobility/OOB (PT f/u as recommended by MD)     Equipment Recommendations  None recommended by PT    Recommendations for Other Services       Precautions / Restrictions Precautions Precautions: Back;Fall Precaution Booklet Issued: Yes (comment) Precaution Comments: Issued back handout in Spanish Required Braces or Orthoses: Spinal Brace Spinal Brace: Lumbar corset;Applied in sitting position Restrictions Weight Bearing Restrictions: No    Mobility  Bed Mobility Overal bed mobility: Needs Assistance Bed Mobility: Sidelying to Sit   Sidelying to sit: Min assist     Sit to sidelying:  (with LE's) General bed mobility comments: instructed in log rolling  Transfers Overall transfer level: Needs assistance Equipment used: Rolling walker (2 wheeled) Transfers: Sit to/from Stand Sit to Stand: Min guard         General transfer comment: cues for safest technique  Ambulation/Gait Ambulation/Gait assistance: Min guard Ambulation Distance (Feet): 300 Feet Assistive device: Rolling walker (2 wheeled) Gait Pattern/deviations: Step-through pattern;Decreased stride length   Gait velocity interpretation: Below normal speed for age/gender General Gait Details: cues to stand up straight   Stairs Stairs: Yes Stairs assistance: Min assist Stair Management: No rails;Step to pattern;Forwards Number of Stairs: 3 General stair comments: Performed twice. Held to one rail with both Animal nutritionist    Modified Rankin (Stroke Patients Only)       Balance                                    Cognition Arousal/Alertness: Awake/alert Behavior During Therapy: WFL for tasks assessed/performed Overall Cognitive Status: Within Functional Limits for tasks assessed                      Exercises      General Comments General comments (skin integrity, edema, etc.): Wife present. We were able to communicate today without the use of an interpreter. Pt reports no questions at end of session. Pt took several standing rest breaks to let his arms rest.       Pertinent Vitals/Pain Pain Assessment: 0-10 Pain Score: 6  Pain Location: Low back  and in groin Pain Intervention(s): Limited activity within patient's tolerance;Monitored during session    Home Living                      Prior Function            PT Goals (current goals can now be found in the care plan section) Progress towards PT goals: Progressing toward goals    Frequency  Min 5X/week    PT Plan      Co-evaluation             End of Session Equipment Utilized During Treatment: Gait belt;Back brace Activity Tolerance: Patient tolerated treatment well;Other (comment) (Pt reports being tired at the end of the walk. ) Patient left: in bed;with family/visitor present;with call bell/phone within reach     Time: 1341-1406 PT Time Calculation (min) (ACUTE  ONLY): 25 min  Charges:  $Gait Training: 23-37 mins                    G Codes:      [name] 08/13/2015, 2:17 PM  [name], [place]  [phone] August 13, 2015

## 2015-08-05 NOTE — Progress Notes (Signed)
CBG 53. Alert and oriented. Asymptomatic. Administered juice/sugar. Recheck 63. Administered another juice/sugar. Recheck 124. Report to oncoming nurse.

## 2015-08-05 NOTE — Progress Notes (Signed)
OT Cancellation Note  Patient Details Name: Chase [name] MRN: [id number] DOB: 1951/05/20   Cancelled Treatment:    Reason Eval/Treat Not Completed: Pain limiting ability to participate - pt reports severe pain and requested to rest for awhile. Pt also would prefer his son or daughter translate instead of a Electronics engineer. Will attempt to complete OT evaluation at a later time if schedule allows.  Redmond Baseman, OTR/L Pager: [phone] 08/05/2015, 3:17 PM

## 2015-08-05 NOTE — Progress Notes (Signed)
BP 84/44 mmHg  Pulse 63  Temp(Src) 97.8 F (36.6 C) (Oral)  Resp 19  Ht 5\' 4"  (1.626 m)  Wt 153 lb (69.4 kg)  BMI 26.25 kg/m2  SpO2 97% Alert and oriented x 4, speech is clear and fluent Moving all extremities well Wound is clean, flat dry. Walked well yesterday Complaining of constipation, will give help today. Continue pt

## 2015-08-05 NOTE — Patient Instructions (Signed)
Artrodesis vertebral, cuidados posteriores (Spinal Fusion, Care After)  Siga estas instrucciones durante las prximas semanas. Estas indicaciones le proporcionan informacin general acerca de cmo deber cuidarse despus del procedimiento. El mdico tambin podr darle instrucciones especficas. El tratamiento ha sido planificado segn las prcticas mdicas actuales, pero en algunos casos pueden ocurrir problemas. Comunquese con el mdico si tiene algn problema o tiene preguntas despus del procedimiento. [place] los medicamentos para el dolor que le haya prescrito el mdico. No tome medicamentos para el dolor de venta libre a menos que se lo haya indicado el mdico.  No conduzca si est tomando analgsicos narcticos.  Cambie el vendaje si fuera necesario o con la frecuencia que le indique el profesional.  No moje el corte de la ciruga (incisin) Luego de [employer] podr tomar duchas breves (en lugar de baos), pero debe mantener la incisin limpia y [place]. [place] la incisin con un envoltorio plstico mientras se baa y Location manager incisin seca. Luego de [employer] luego de la [place], una vez que la incisin haya curado y el mdico lo autorice, podr tomar baos o Social worker.  Si le han prescrito medicamentos para prevenir la coagulacin de la [place], siga las instrucciones con atencin.  Verifique la zona que rodea la incisin con frecuencia. Observe seales de enrojecimiento e inflamacin. Adems, observe si presenta una prdida de lquido de la herida. Puede utilizar un espejo o pedirle a un miembro de la familia que observe su incisin si est en algn lugar difcil de ver.  Hable con el mdico acerca de las actividades que debe evitar y por cunto [place].  Camine todo lo que pueda.  No levante pesos mayores a 10 libras (4.5 kg) hasta que el mdico lo autorice.  No gire ni se voltee por algunas semanas. Intente no jalar cosas. Evite estar  sentado durante largos perodos. Cambie de posicin al menos una vez por hora.  Consulte con el mdico qu tipo de ejercicios debe realizar para reforzar su espalda y cundo debe comenzar a Secondary school teacher. SOLICITE ATENCIN MDICA DE INMEDIATO SI:   El dolor empeora repentinamente.  La zona de la incisin se ve roja, inflamada, sangra o pierde lquido.  Presenta mayor dolor, adormecimiento, debilidad o inflamacin en las piernas o los pies.  Tiene problemas para Aeronautical engineer miccin o los movimientos intestinales.  Tiene dificultad para respirar.  Siente dolor en el pecho.  Tiene fiebre. ASEGRESE DE QUE:   Comprende estas instrucciones.  Controlar su enfermedad.  Solicitar ayuda de inmediato si no mejora o si empeora.   Esta informacin no tiene Marine scientist el consejo del mdico. Asegrese de hacerle al mdico cualquier pregunta que tenga.   Document Released: 08/15/2008 Document Revised: 08/11/2011 Elsevier Interactive Patient Education [employer].

## 2015-08-06 LAB — GLUCOSE, CAPILLARY
Glucose-Capillary: 103 mg/dL — ABNORMAL HIGH (ref 65–99)
Glucose-Capillary: 116 mg/dL — ABNORMAL HIGH (ref 65–99)
Glucose-Capillary: 90 mg/dL (ref 65–99)
Glucose-Capillary: 127 mg/dL — ABNORMAL HIGH (ref 65–99)
Glucose-Capillary: 112 mg/dL — ABNORMAL HIGH (ref 65–99)
Glucose-Capillary: 57 mg/dL — ABNORMAL LOW (ref 65–99)
Glucose-Capillary: 68 mg/dL (ref 65–99)
Glucose-Capillary: 84 mg/dL (ref 65–99)

## 2015-08-06 NOTE — Progress Notes (Signed)
BP 108/73 mmHg  Pulse 64  Temp(Src) 97.9 F (36.6 C) (Oral)  Resp 18  Ht 5\' 4"  (1.626 m)  Wt 153 lb (69.4 kg)  BMI 26.25 kg/m2  SpO2 100% Alert and orientedx 4 Moving better Will discharge tomorrow.  Wound is clean, and dry.

## 2015-08-06 NOTE — Progress Notes (Signed)
CBG 52. Alert and oriented. Asymptomatic. Administered  8 oz of orange juice; recheck CBG was 68: administered another 8 oz of orange juice CBG was 84, will make sure patient receives juice and snacks throughout the night.

## 2015-08-06 NOTE — Evaluation (Addendum)
Occupational Therapy Evaluation Patient Details Name: Chase [name] MRN: [id number] DOB: 02-26-1951 Today's Date: 08/06/2015    History of Present Illness Pt is s/p LUMBAR THREE-FOUR, LUMBAR FOUR-FIVE POSTERIOR LUMBAR FUSION. Recent cervical fusion 11/16 and pt with DM. PMH also includes HTN, high cholesterol, depression, arthritis, urinary frequency, numbness and tingling in hands, Rt shoulder arthroscopy.     Clinical Impression   Pt s/p above. Pt independent with ADLs, PTA. Feel pt will benefit from acute OT to increase independence prior to d/c. Do not feel pt will need follow up OT upon d/c. Will continue to follow acutely.     Follow Up Recommendations  No OT follow up;Supervision/Assistance - 24 hour    Equipment Recommendations  3 in 1 bedside comode;Other (comment) (AE if wanted)    Recommendations for Other Services       Precautions / Restrictions Precautions Precautions: Back;Fall Precaution Booklet Issued:  (reports he received handout) Precaution Comments: educated on back precautions Required Braces or Orthoses: Spinal Brace Spinal Brace: Lumbar corset;Applied in sitting position Restrictions Weight Bearing Restrictions: No      Mobility Bed Mobility Overal bed mobility: Needs Assistance Bed Mobility: Rolling;Sidelying to Sit;Sit to Sidelying Rolling: Supervision Sidelying to sit: Supervision     Sit to sidelying: Supervision (performed in between sit to sidelying and sit to supine technique) General bed mobility comments: cues given  Transfers Overall transfer level: Needs assistance   Transfers: Sit to/from Stand Sit to Stand: Min guard            Balance Min assist for ambulation.                          ADL Overall ADL's : Needs assistance/impaired                 Upper Body Dressing : Minimal assistance;Sitting    Lower Body Dressing: maximal assistance; sit to/from stand    Toilet Transfer: Minimal assistance;Ambulation  (sit to stand from bed)           Functional mobility during ADLs: Minimal assistance (hand held assist) General ADL Comments: Educated on LB ADL technique and AE, including what pt could use for toilet aid if needed. Instructed to wear shirt under brace.     Vision     Perception     Praxis      Pertinent Vitals/Pain Pain Assessment: Faces Pain Score: 4  Faces Pain Scale: Hurts little more Pain Location: unsure Pain Descriptors / Indicators:  grimacing Pain Intervention(s): Monitored during session;Repositioned     Hand Dominance     Extremity/Trunk Assessment Upper Extremity Assessment Upper Extremity Assessment: Overall WFL for tasks assessed   Lower Extremity Assessment Lower Extremity Assessment: Defer to PT evaluation       Communication Communication Communication: Prefers language other than [place] (Spanish); used daughter via phone for interpreter as this seems what pt wanted to do. Offered to use other phone interpreter.    Cognition Arousal/Alertness: Awake/alert Behavior During Therapy: WFL for tasks assessed/performed Overall Cognitive Status: Within Functional Limits for tasks assessed                     General Comments       Exercises       Shoulder Instructions      Home Living Family/patient expects to be discharged to:: Private residence Living Arrangements: Spouse/significant other Available Help at Discharge: Family;Available 24 hours/day Type of Home: [place]  Access: Stairs to enter [employer] of Steps: 2   Home Layout: One level     Bathroom Shower/Tub: Tub/shower unit Shower/tub characteristics: Architectural technologist: Standard     Home Equipment: Environmental consultant - 2 wheels;Cane - single point          Prior Functioning/Environment Level of Independence: Independent with assistive device(s)             OT Diagnosis: Acute pain   OT Problem List: Decreased range of motion;Decreased knowledge of  precautions;Decreased knowledge of use of DME or AE;Impaired balance (sitting and/or standing);Decreased activity tolerance; pain   OT Treatment/Interventions: DME and/or AE instruction;Therapeutic activities;Patient/family education;Balance training;Self-care/ADL training    OT Goals(Current goals can be found in the care plan section) Acute Rehab OT Goals Patient Stated Goal: not stated OT Goal Formulation: With patient Time For Goal Achievement: 08/13/15 Potential to Achieve Goals: Good ADL Goals Pt Will Perform Lower Body Dressing: with set-up;with supervision;sit to/from stand (with or without AE) Pt Will Transfer to Toilet: with supervision;ambulating (3 in 1 over commode) Pt Will Perform Tub/Shower Transfer: Tub transfer;with supervision;with set-up;ambulating;3 in 1;rolling walker Additional ADL Goal #1: Pt will independently verbalize 3/3 back precautions and maintain in session.  OT Frequency: Min 2X/week   Barriers to D/C:            Co-evaluation              End of Session Equipment Utilized During Treatment: Gait belt;Back brace;Other (comment) (AE) Nurse Communication: Other (comment) (after session pt in hallway without brace-Nurse notified)  Activity Tolerance: Patient tolerated treatment well Patient left: in bed;with call bell/phone within reach;with family/visitor present   Time: [id number] OT Time Calculation (min): 23 min Charges:  OT General Charges $OT Visit: 1 Procedure OT Evaluation $OT Eval Moderate Complexity: 1 Procedure G-CodesBenito Mccreedy OTR/L [id number] 08/06/2015, 3:24 PM

## 2015-08-06 NOTE — Progress Notes (Signed)
Physical Therapy Treatment Patient Details Name: Chase [name] MRN: [id number] DOB: 1950-10-18 Today's Date: 08/06/2015    History of Present Illness Pt is s/p Posterior lumbar interbody arthrodesis L3/4, L4/5. with durotomy.  Recent cervical fusion 11/16 and pt with DM.      PT Comments    Patient mobilizing well, ambulating 300 feet with rw and supervision. Pt found up without brace, reinforced need to use when out of bed. Patient and spouse confirm understanding. PT to continue to follow acutely and progress mobility as tolerated. Pt denies any questions or concerns following session.   Follow Up Recommendations  Supervision for mobility/OOB;No PT follow up     Equipment Recommendations  None recommended by PT    Recommendations for Other Services       Precautions / Restrictions Precautions Precautions: Back;Fall Required Braces or Orthoses: Spinal Brace Spinal Brace: Other (comment) (pt found up without brace, reviewed need to wear) Restrictions Weight Bearing Restrictions: No    Mobility  Bed Mobility Overal bed mobility: Needs Assistance Bed Mobility: Rolling;Sit to Supine Rolling: Supervision       Sit to sidelying: Supervision General bed mobility comments: reminder for log roll technique  Transfers                 General transfer comment: found up with nursing upon arrival  Ambulation/Gait Ambulation/Gait assistance: Supervision Ambulation Distance (Feet): 300 Feet Assistive device: Rolling walker (2 wheeled) Gait Pattern/deviations: Step-through pattern Gait velocity: decreased   General Gait Details: antalgic pattern, increasing with increasing distance ambulated.    Stairs            Wheelchair Mobility    Modified Rankin (Stroke Patients Only)       Balance Overall balance assessment: Needs assistance Sitting-balance support: No upper extremity supported Sitting balance-Leahy Scale: Good     Standing balance support: No upper  extremity supported Standing balance-Leahy Scale: Fair Standing balance comment: standing at edge of bed                    Cognition Arousal/Alertness: Awake/alert Behavior During Therapy: WFL for tasks assessed/performed Overall Cognitive Status: Within Functional Limits for tasks assessed                      Exercises      General Comments General comments (skin integrity, edema, etc.): Able to communicate without interpreter, PT rewording communication as needed for improved pt understanding. Patient confirms that he feels like he is getting better.        Pertinent Vitals/Pain Pain Assessment: 0-10 Pain Score: 6  Pain Location: back and right groin Pain Descriptors / Indicators:  (pain) Pain Intervention(s): Monitored during session;Limited activity within patient's tolerance    Home Living                      Prior Function            PT Goals (current goals can now be found in the care plan section) Acute Rehab PT Goals Patient Stated Goal: go home PT Goal Formulation: With patient Time For Goal Achievement: 08/11/15 Potential to Achieve Goals: Good Progress towards PT goals: Progressing toward goals    Frequency  Min 5X/week    PT Plan Current plan remains appropriate    Co-evaluation             End of Session Equipment Utilized During Treatment: Gait belt Activity Tolerance: Patient tolerated treatment well  Patient left: in bed;with call bell/phone within reach;with family/visitor present     Time: 0916-0926 PT Time Calculation (min) (ACUTE ONLY): 10 min  Charges:  $Gait Training: 8-22 mins                    G Codes:      Cassell Clement, PT, CSCS Pager [phone] Office [phone]  08/06/2015, 11:47 AM

## 2015-08-06 NOTE — Care Management Note (Addendum)
Case Management Note  Patient Details  Name: Dwayn Vanhooser MRN: [id number] Date of Birth: July 08, 1950  Subjective/Objective:                    Action/Plan: Patient progressing well and PT with recs for supervision only with mobility. Patient's diagnosis does not qualify with Medicaid for HHPT/OT. Patient made aware. CM continuing to follow for discharge needs.   Expected Discharge Date:                  Expected Discharge Plan:     In-House Referral:     Discharge planning Services     Post Acute Care Choice:    Choice offered to:     DME Arranged:    DME Agency:     HH Arranged:    HH Agency:     Status of Service:  In process, will continue to follow  Medicare Important Message Given:    Date Medicare IM Given:    Medicare IM give by:    Date Additional Medicare IM Given:    Additional Medicare Important Message give by:     If discussed at [place] of Stay Meetings, dates discussed:    Additional Comments:  Pollie Friar, RN 08/06/2015, 2:12 PM

## 2015-08-07 LAB — GLUCOSE, CAPILLARY: Glucose-Capillary: 80 mg/dL (ref 65–99)

## 2015-08-07 MED ORDER — OXYCODONE-ACETAMINOPHEN 5-325 MG PO TABS: 1.0000 | ORAL_TABLET | Freq: Four times a day (QID) | ORAL | Status: DC | PRN

## 2015-08-07 MED ORDER — CYCLOBENZAPRINE HCL 10 MG PO TABS: 10.0000 mg | ORAL_TABLET | Freq: Three times a day (TID) | ORAL | Status: DC | PRN

## 2015-08-07 MED FILL — Sodium Chloride IV Soln 0.9%: INTRAVENOUS | Qty: 2000 | Status: AC

## 2015-08-07 MED FILL — Heparin Sodium (Porcine) Inj 1000 Unit/ML: INTRAMUSCULAR | Qty: 30 | Status: AC

## 2015-08-07 NOTE — Progress Notes (Addendum)
rn began going over discharge instructions. Family asking for a bedside commode, or raise toilet seat. rn talked with case manager who will get this for them. Pt waiting for equipment and then is discharged.   Pt discharged home with family, by car, assessment stable, discharge instructions reviewed with daughter who is fluent in [place], pt and daughter instructed that they must go by Dr Christella Noa office to pick up prescriptions, all questions answered. IV removed. P  Equipment brought to room 3N1. Pt taken by wheelchair to exit by volunteer. Time of discharge: 1150

## 2015-08-07 NOTE — Care Management Note (Signed)
Case Management Note  Patient Details  Name: Chase [name] MRN: [id number] Date of Birth: [birth year]  Subjective/Objective:                    Action/Plan: Orders for a 3 in 1 for home. CM spoke with Manuela Schwartz with [facility] and she will deliver the equipment to the room. Bedside RN updated.   Expected Discharge Date:                  Expected Discharge Plan:     In-House Referral:     Discharge planning Services     Post Acute Care Choice:    Choice offered to:     DME Arranged:    DME Agency:     HH Arranged:    HH Agency:     Status of Service:  In process, will continue to follow  Medicare Important Message Given:    Date Medicare IM Given:    Medicare IM give by:    Date Additional Medicare IM Given:    Additional Medicare Important Message give by:     If discussed at [place] of Stay Meetings, dates discussed:    Additional Comments:  Pollie Friar, RN 08/07/2015, 11:25 AM

## 2015-08-07 NOTE — Progress Notes (Signed)
Physical Therapy Treatment Patient Details Name: Chase [name] MRN: [id number] DOB: 01-Jun-1951 Today's Date: 08/07/2015    History of Present Illness Pt is s/p LUMBAR THREE-FOUR, LUMBAR FOUR-FIVE POSTERIOR LUMBAR FUSION. Recent cervical fusion 11/16 and pt with DM. PMH also includes HTN, high cholesterol, depression, arthritis, urinary frequency, numbness and tingling in hands, Rt shoulder arthroscopy.      PT Comments    Patient ambulating 275 feet with rw and no instability. Able to communicate without interpreter. All back precautions were reviewed with the patient. Following the session the patient indicated that he feels confident with going home.    Follow Up Recommendations  Supervision for mobility/OOB;No PT follow up     Equipment Recommendations  None recommended by PT    Recommendations for Other Services       Precautions / Restrictions Precautions Precautions: Back;Fall Precaution Comments: reviewed back precautions with patient and spouse Required Braces or Orthoses: Spinal Brace Spinal Brace: Lumbar corset;Applied in sitting position Spinal Brace Comments: patient wearing brace upon arrival Restrictions Weight Bearing Restrictions: No    Mobility  Bed Mobility Overal bed mobility: Needs Assistance Bed Mobility: Rolling;Sidelying to Sit Rolling: Supervision Sidelying to sit: Supervision       General bed mobility comments: supervision for safety  Transfers Overall transfer level: Needs assistance Equipment used: Rolling walker (2 wheeled) Transfers: Sit to/from Stand Sit to Stand: Supervision         General transfer comment: reminder for hand placement  Ambulation/Gait Ambulation/Gait assistance: Supervision Ambulation Distance (Feet): 275 Feet Assistive device: Rolling walker (2 wheeled) Gait Pattern/deviations: Step-through pattern;Wide base of support Gait velocity: decreased       Stairs         General stair comments: patient declined  stairs  Wheelchair Mobility    Modified Rankin (Stroke Patients Only)       Balance Overall balance assessment: Needs assistance Sitting-balance support: No upper extremity supported Sitting balance-Leahy Scale: Good     Standing balance support: No upper extremity supported Standing balance-Leahy Scale: Fair Standing balance comment: static standing                    Cognition Arousal/Alertness: Awake/alert Behavior During Therapy: WFL for tasks assessed/performed Overall Cognitive Status: Within Functional Limits for tasks assessed                      Exercises      General Comments        Pertinent Vitals/Pain Pain Assessment: 0-10 Pain Score: 5  Pain Location: rt groin Pain Descriptors / Indicators:  (hurts) Pain Intervention(s): Limited activity within patient's tolerance;Monitored during session    Home Living                      Prior Function            PT Goals (current goals can now be found in the care plan section) Acute Rehab PT Goals Patient Stated Goal: go home today PT Goal Formulation: With patient Time For Goal Achievement: 08/11/15 Potential to Achieve Goals: Good Progress towards PT goals: Progressing toward goals    Frequency  Min 5X/week    PT Plan Current plan remains appropriate    Co-evaluation             End of Session Equipment Utilized During Treatment: Back brace Activity Tolerance: Patient tolerated treatment well Patient left: in bed;with call bell/phone within reach;with family/visitor present  Time: 0820-0832 PT Time Calculation (min) (ACUTE ONLY): 12 min  Charges:  $Gait Training: 8-22 mins                    G Codes:      Cassell Clement, PT, CSCS Pager [phone] Office [phone]  08/07/2015, 10:07 AM

## 2015-08-07 NOTE — Progress Notes (Signed)
Occupational Therapy Treatment Patient Details Name: Chase [name] MRN: [id number] DOB: December 08, 1950 Today's Date: 08/07/2015    History of present illness Pt is s/p LUMBAR THREE-FOUR, LUMBAR FOUR-FIVE POSTERIOR LUMBAR FUSION. Recent cervical fusion 11/16 and pt with DM. PMH also includes HTN, high cholesterol, depression, arthritis, urinary frequency, numbness and tingling in hands, Rt shoulder arthroscopy.     OT comments  Patient progressing nicely towards OT goals, continue plan of care for now. Plan is for patient to discharge home today with wife and daughter (prn).    Follow Up Recommendations  No OT follow up;Supervision/Assistance - 24 hour    Equipment Recommendations  3 in 1 bedside comode    Recommendations for Other Services  None at this time   Precautions / Restrictions Precautions Precautions: Back;Fall Precaution Comments: reviewed back precautions with patient and daughter Required Braces or Orthoses: Spinal Brace Spinal Brace: Lumbar corset;Applied in sitting position Spinal Brace Comments: patient wearing brace upon arrival Restrictions Weight Bearing Restrictions: No    Mobility Bed Mobility General bed mobility comments: pt found seated EOB upon OT entering/exiting room - see PT note for more information  Transfers Overall transfer level: Needs assistance Equipment used: Straight cane Transfers: Sit to/from Stand Sit to Stand: Supervision;Min guard General transfer comment: increased time    Balance Overall balance assessment: Needs assistance Sitting-balance support: No upper extremity supported;Feet supported Sitting balance-Leahy Scale: Good     Standing balance support: Single extremity supported;During functional activity Standing balance-Leahy Scale: Fair Standing balance comment: static standing    ADL Overall ADL's : Needs assistance/impaired Eating/Feeding: Set up;Sitting   Grooming: Min guard;Standing   Upper Body Bathing: Minimal  assitance;Sitting   Lower Body Bathing: Moderate assistance;Sit to/from stand Lower Body Bathing Details (indicate cue type and reason): administerred LH sponge to increase independence  Upper Body Dressing : Minimal assistance;Sitting Upper Body Dressing Details (indicate cue type and reason): minimal assistance needed for lumbar corset  Lower Body Dressing: Maximal assistance;Sit to/from stand Lower Body Dressing Details (indicate cue type and reason): Administerred reacher to increase independence. Pt and daughter report that wife will mainly be assisting with LB ADLs.  Toilet Transfer: Min guard;Ambulation;BSC Toilet Transfer Details (indicate cue type and reason): using SP cane Toileting- Clothing Manipulation and Hygiene: Minimal assistance;Sit to/from stand   Tub/ Shower Transfer: Min guard;3 in 1;Tub Programmer, multimedia Details (indicate cue type and reason): simulated in walk-in shower like tub/shower (that of which pt has at home)  Functional mobility during ADLs: Min guard (SPC) General ADL Comments: Daughter present. Pt found seated EOB with daughter. Educated both on LB ADLs, safety with functional mobility, donning/doffing of lumbar corset, back precautions and importance of adhering to them, tub/shower transfer, toilet transfer using 3-n-1.      Vision Additional Comments: no change from baseline          Cognition   Behavior During Therapy: WFL for tasks assessed/performed Overall Cognitive Status: Within Functional Limits for tasks assessed (pt prefers spanish)                 Pertinent Vitals/ Pain       Pain Assessment: 0-10 Pain Score: 6  Pain Location: back Pain Descriptors / Indicators: Aching;Sore;Guarding Pain Intervention(s): Limited activity within patient's tolerance;Monitored during session   Frequency Min 2X/week     Progress Toward Goals  OT Goals(current goals can now be ound in the care plan section)  Progress towards  OT goals: Progressing toward goals  Acute Rehab OT Goals  Patient Stated Goal: go home today OT Goal Formulation: With patient/family Time For Goal Achievement: 08/13/15 Potential to Achieve Goals: Good  Plan Discharge plan remains appropriate    End of Session Equipment Utilized During Treatment: Back brace;Other (comment) (AE)   Activity Tolerance Patient tolerated treatment well   Patient Left in bed;with call bell/phone within reach;with family/visitor present (seated EOB)     Time: [id number] OT Time Calculation (min): 13 min  Charges: OT General Charges $OT Visit: 1 Procedure OT Treatments $Self Care/Home Management : 8-22 mins  Chrys Racer , MS, OTR/L, CLT Pager: [phone]  08/07/2015, 10:41 AM

## 2015-08-07 NOTE — Discharge Instructions (Signed)

## 2015-08-15 NOTE — Discharge Summary (Signed)
  Physician Discharge Summary  Patient ID: Chase [name] MRN: [id number] DOB/AGE: Oct 07, 1950 65 y.o.  Admit date: 08/01/2015 Discharge date: 08/15/2015  Admission Diagnoses:lumbar herniated disc L3/4, L4/5, Lumbar stenosis, neurogenic claudication, degenerative disc disease L3/4,4/5   Discharge Diagnoses: lumbar herniated disc L3/4, L4/5, Lumbar stenosis, neurogenic claudication, degenerative disc disease L3/4,4/5  Active Problems:   Osteoarthritis of spine with radiculopathy, lumbar region   Discharged Condition: good  Hospital Course: Chase [name] was admitted and taken to the operating room for an uncomplicated lumbar decompression and arthrodesis at L3/4, and 4/5. He did have a csf leak during the case and was left on bedrest post op for 3 days. He, once out of bed, did very well. His wound was clean, dry, and without signs of infection at discharge. He was ambulating, voiding, and tolerating a regular diet. He was seen and treated by both PT, and OT.   Treatments: surgery: Procedure(s): Posterior lumbar interbody arthrodesis L3/4, L4/5. 2 Peek cages 10x49mm with autograft morsels L3/4, 1 9x90mm peek cage at L4/5 on the right side with autograft morsels (Synthes Opal cages) Lumbar decompression L3,4,and L5 nerve roots bilaterally via complete L3, L4 laminectomies, hemilaminectomy L5. Exposure of the neural foramina bilaterally at L3,4, and 5 well beyond the needed exposure for the PLIF Segmental posterior instrumentation Pedicle screw placement L3,4,and 5 (mas plif nuvasive)   Discharge Exam: Blood pressure 100/64, pulse 73, temperature 97.6 F (36.4 C), temperature source Oral, resp. rate 16, height 5\' 4"  (1.626 m), weight 69.4 kg (153 lb), SpO2 99 %. General appearance: alert, cooperative, appears stated age and no distress Neurologic: Alert and oriented X 3, normal strength and tone. Normal symmetric reflexes. Normal coordination and gait  Disposition: 01-Home or Self Care lumbar herniated  disc    Medication List    TAKE these medications        cyclobenzaprine 10 MG tablet  Commonly known as:  FLEXERIL  Take 1 tablet (10 mg total) by mouth 3 (three) times daily as needed for muscle spasms.     FISH OIL PO  Take 1 capsule by mouth 2 (two) times daily.     glyBURIDE-metformin 5-500 MG tablet  Commonly known as:  GLUCOVANCE  Take 1 tablet by mouth 3 (three) times daily.     lisinopril 5 MG tablet  Commonly known as:  PRINIVIL,ZESTRIL  Take 5 mg by mouth daily.     oxyCODONE-acetaminophen 5-325 MG tablet  Commonly known as:  ROXICET  Take 1-2 tablets by mouth every 6 (six) hours as needed for severe pain.     pravastatin 40 MG tablet  Commonly known as:  PRAVACHOL  Take 40 mg by mouth daily.           Follow-up Information    Follow up with Lion Fernandez L, MD In 3 weeks.   Specialty:  Neurosurgery   Why:  call the office to make an appointment   Contact information:   [address] [place] [address] [phone]       Signed: Winfield Cunas 08/15/2015, 8:28 PM

## 2015-09-27 ENCOUNTER — Other Ambulatory Visit (HOSPITAL_COMMUNITY): Payer: Self-pay | Admitting: Urology

## 2015-09-27 DIAGNOSIS — C61 Malignant neoplasm of prostate: Secondary | ICD-10-CM

## 2015-10-08 ENCOUNTER — Encounter (HOSPITAL_COMMUNITY): Payer: Self-pay

## 2015-10-08 ENCOUNTER — Encounter (HOSPITAL_COMMUNITY)
Admission: RE | Admit: 2015-10-08 | Discharge: 2015-10-08 | Disposition: A | Discharge status: Home / Self Care | Payer: Medicaid Other | Source: Ambulatory Visit | Attending: Urology | Admitting: Urology

## 2015-10-08 DIAGNOSIS — C61 Malignant neoplasm of prostate: Secondary | ICD-10-CM | POA: Diagnosis not present

## 2015-10-08 MED ORDER — TECHNETIUM TC 99M MEDRONATE IV KIT: 26.1000 | PACK | Freq: Once | INTRAVENOUS | Status: DC | PRN

## 2015-11-29 ENCOUNTER — Encounter: Payer: Self-pay | Admitting: Family Medicine

## 2015-11-29 ENCOUNTER — Ambulatory Visit (INDEPENDENT_AMBULATORY_CARE_PROVIDER_SITE_OTHER): Payer: Medicaid Other | Admitting: Family Medicine

## 2015-11-29 ENCOUNTER — Ambulatory Visit (INDEPENDENT_AMBULATORY_CARE_PROVIDER_SITE_OTHER): Payer: Medicaid Other

## 2015-11-29 DIAGNOSIS — M542 Cervicalgia: Secondary | ICD-10-CM
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
## 2016-02-21 ENCOUNTER — Ambulatory Visit (HOSPITAL_COMMUNITY)
Admission: RE | Admit: 2016-02-21 | Discharge: 2016-02-21 | Disposition: A | Discharge status: Home / Self Care | Payer: Medicaid Other | Source: Ambulatory Visit | Attending: Family | Admitting: Family

## 2016-02-21 ENCOUNTER — Other Ambulatory Visit (HOSPITAL_COMMUNITY): Payer: Self-pay | Admitting: Family

## 2016-02-21 DIAGNOSIS — M19012 Primary osteoarthritis, left shoulder: Secondary | ICD-10-CM | POA: Insufficient documentation

## 2016-02-21 DIAGNOSIS — M25512 Pain in left shoulder: Secondary | ICD-10-CM

## 2016-02-21 DIAGNOSIS — M21922 Unspecified acquired deformity of left upper arm: Secondary | ICD-10-CM | POA: Diagnosis not present

## 2016-06-19 ENCOUNTER — Other Ambulatory Visit: Payer: Self-pay | Admitting: *Deleted

## 2016-06-19 DIAGNOSIS — R609 Edema, unspecified: Secondary | ICD-10-CM

## 2016-06-24 ENCOUNTER — Encounter: Payer: Self-pay | Admitting: Surgery

## 2016-06-27 ENCOUNTER — Ambulatory Visit (HOSPITAL_COMMUNITY)
Admission: RE | Admit: 2016-06-27 | Discharge: 2016-06-27 | Disposition: A | Discharge status: Home / Self Care | Payer: Medicaid Other | Source: Ambulatory Visit | Attending: Surgery | Admitting: Surgery

## 2016-06-27 ENCOUNTER — Ambulatory Visit (INDEPENDENT_AMBULATORY_CARE_PROVIDER_SITE_OTHER): Payer: Medicaid Other | Admitting: Surgery

## 2016-06-27 ENCOUNTER — Encounter: Payer: Self-pay | Admitting: Surgery

## 2016-06-27 ENCOUNTER — Encounter: Payer: Self-pay | Admitting: Orthopedic Surgery

## 2016-06-27 VITALS — BP 111/74 | HR 68 | Temp 98.1°F | Resp 16 | Ht 63.0 in | Wt 158.0 lb

## 2016-06-27 DIAGNOSIS — R609 Edema, unspecified: Secondary | ICD-10-CM | POA: Diagnosis present

## 2016-06-27 DIAGNOSIS — G540 Brachial plexus disorders: Secondary | ICD-10-CM | POA: Diagnosis not present

## 2016-06-27 NOTE — Progress Notes (Signed)
[facility]ist of [place]  Patient name: Chase [name] MRN: [id number] DOB: 12/06/1950 Sex: male   REFERRING PROVIDER:    Dr. Christella Noa   REASON FOR CONSULT:    Shoulder pain and arm swelling  HISTORY OF PRESENT ILLNESS:   Chase [name] is a 66 y.o. male, who is Referred today for evaluation of pain when he moves his neck as well as pain in his left upper extremity.  He also has swelling in the left arm.  He is status post two-level decompression and ACDF in November 2016.  In March 2017 he underwent L3, L4, L5 PLIF.  He has undergone nerve conduction studies which were normal with the exception of mild left ulnar neuropathy.  There are concerns over possible thoracic outlet syndrome and therefore he is here today.  He describes swelling and decreased strength in the left arm for approximately 5 months.  This is also associated with tingling.  He does complain of dizziness.  He says he has pain in his head particularly when he moves it.  There are no relieving factors.  He denies any history of clotting disorder.  He denies any trauma.  The patient suffers from hypercholesterolemia and is on a statin.  He takes an ACE inhibitor for hypertension.  He is a current smoker.  He is a diabetic with a most recent hemoglobin A1c approximate one year ago 7.0  PAST MEDICAL HISTORY    Past Medical History:  Diagnosis Date  . Arthritis   . Depression   . Diabetes mellitus without complication ([place])   . Headache   . High cholesterol   . Hypertension   . Inguinal hernia    rt  . Numbness and tingling in hands   . Urinary frequency      FAMILY HISTORY   No family history on file.  SOCIAL HISTORY:   Social History   Social History  . Marital status: Married    Spouse name: N/A  . Number of children: N/A  . Years of education: N/A   Occupational History  . Not on file.   Social History Main Topics  . Smoking status:  Current Every Day Smoker    Packs/day: 0.25    Years: 40.00    Types: Cigarettes  . Smokeless tobacco: Never Used  . Alcohol use Yes     Comment: occassionally  . Drug use: No  . Sexual activity: Not on file   Other Topics Concern  . Not on file   Social History Narrative  . No narrative on file    ALLERGIES:    No Known Allergies  CURRENT MEDICATIONS:    Current Outpatient Prescriptions  Medication Sig Dispense Refill  . acetaminophen-codeine (TYLENOL #3) 300-30 MG tablet Take 1 tablet by mouth every 6 (six) hours as needed for moderate pain.    Marland Kitchen glyBURIDE-metformin (GLUCOVANCE) 5-500 MG tablet Take 1 tablet by mouth 3 (three) times daily.     Marland Kitchen lisinopril (PRINIVIL,ZESTRIL) 5 MG tablet Take 5 mg by mouth daily.    . Omega-3 Fatty Acids (FISH OIL PO) Take 1 capsule by mouth 2 (two) times daily.    . pravastatin (PRAVACHOL) 40 MG tablet Take 40 mg by mouth daily.     No current facility-administered medications for this visit.     REVIEW OF SYSTEMS:   [X]  denotes positive finding, [ ]  denotes negative finding Cardiac  Comments:  Chest pain or chest pressure: x   Shortness of  breath upon exertion:    Short of breath when lying flat: x   Irregular heart rhythm: x       Vascular    Pain in calf, thigh, or hip brought on by ambulation: x   Pain in feet at night that wakes you up from your sleep:  x   Blood clot in your veins: x   Leg swelling:  x       Pulmonary    Oxygen at home:    Productive cough:     Wheezing:         Neurologic    Sudden weakness in arms or legs:  x   Sudden numbness in arms or legs:  x   Sudden onset of difficulty speaking or slurred speech: x   Temporary loss of vision in one eye:     Problems with dizziness:  x       Gastrointestinal    Blood in stool:      Vomited blood:         Genitourinary    Burning when urinating:  x   Blood in urine:        Psychiatric    Major depression:         Hematologic    Bleeding  problems:    Problems with blood clotting too easily: x       Skin    Rashes or ulcers:        Constitutional    Fever or chills: x    PHYSICAL EXAM:   Vitals:   06/27/16 0903 06/27/16 0905  BP: 115/73 111/74  Pulse: 68 68  Resp:  16  Temp:  98.1 F (36.7 C)  TempSrc:  Oral  SpO2:  98%  Weight:  158 lb (71.7 kg)  Height:  5\' 3"  (1.6 m)    GENERAL: The patient is a well-nourished male, in no acute distress. The vital signs are documented above. CARDIAC: There is a regular rate and rhythm.  VASCULAR: Left hand edema.  Palpable right radial pulse.  I cannot palpate the left radial pulse however it does have appropriate Doppler signal.  On provocative movements with raising the left arm over the shoulder, there was no significant change in the Doppler signal to his radial artery.  As a palpable brachial pulse. PULMONARY: Nonlabored respirations MUSCULOSKELETAL: There are no major deformities or cyanosis. NEUROLOGIC: No focal weakness or paresthesias are detected. SKIN: There are no ulcers or rashes noted. PSYCHIATRIC: The patient has a normal affect.  STUDIES:   Venous Doppler studies were performed today which showed no evidence of DVT or SVT  Thoracic outlet studies could not be performed today as we could not get preapproval from Medicaid.  He is scheduled to have this done on February 8  ASSESSMENT and PLAN   Possible thoracic outlet syndrome, left colon potentially thoracic outlet syndrome could explain some of the symptoms in his left hand but I do not think it explains all of the symptoms that he is having.  I tried to have ultrasound evaluate him today, however we could not get preauthorization and so the study is scheduled for February 8.  I'm also scheduling him for a bilateral venogram which was performed on February 6 to look for central venous stenosis.  He will follow with me after the studies have been performed.   Annamarie Major, MD [facility] of [facility] [phone] Pager [phone]

## 2016-06-30 ENCOUNTER — Other Ambulatory Visit: Payer: Self-pay

## 2016-07-01 ENCOUNTER — Encounter: Payer: Self-pay | Admitting: Physical Medicine and Rehabilitation

## 2016-07-08 ENCOUNTER — Encounter (HOSPITAL_COMMUNITY): Admission: RE | Payer: Self-pay | Source: Ambulatory Visit

## 2016-07-08 ENCOUNTER — Ambulatory Visit (HOSPITAL_COMMUNITY): Admission: RE | Admit: 2016-07-08 | Payer: Medicaid Other | Source: Ambulatory Visit | Admitting: Surgery

## 2016-07-08 SURGERY — UPPER EXTREMITY VENOGRAPHY

## 2016-07-09 ENCOUNTER — Other Ambulatory Visit: Payer: Self-pay

## 2016-07-09 DIAGNOSIS — M25512 Pain in left shoulder: Secondary | ICD-10-CM

## 2016-07-10 ENCOUNTER — Ambulatory Visit (HOSPITAL_COMMUNITY)
Admission: RE | Admit: 2016-07-10 | Discharge: 2016-07-10 | Disposition: A | Discharge status: Home / Self Care | Payer: Medicaid Other | Source: Ambulatory Visit | Attending: Vascular Surgery | Admitting: Vascular Surgery

## 2016-07-10 DIAGNOSIS — M25512 Pain in left shoulder: Secondary | ICD-10-CM | POA: Insufficient documentation

## 2016-07-10 DIAGNOSIS — R931 Abnormal findings on diagnostic imaging of heart and coronary circulation: Secondary | ICD-10-CM | POA: Insufficient documentation

## 2016-07-14 ENCOUNTER — Other Ambulatory Visit: Payer: Self-pay

## 2016-07-22 ENCOUNTER — Encounter: Payer: Self-pay | Admitting: Surgery

## 2016-07-22 ENCOUNTER — Encounter (HOSPITAL_COMMUNITY): Payer: Self-pay | Admitting: Surgery

## 2016-07-22 ENCOUNTER — Encounter (HOSPITAL_COMMUNITY)
Admission: RE | Disposition: A | Discharge status: Home / Self Care | Payer: Self-pay | Source: Ambulatory Visit | Attending: Surgery

## 2016-07-22 ENCOUNTER — Ambulatory Visit (HOSPITAL_COMMUNITY)
Admission: RE | Admit: 2016-07-22 | Discharge: 2016-07-22 | Disposition: A | Discharge status: Home / Self Care | Payer: Medicaid Other | Source: Ambulatory Visit | Attending: Surgery | Admitting: Surgery

## 2016-07-22 DIAGNOSIS — E119 Type 2 diabetes mellitus without complications: Secondary | ICD-10-CM | POA: Diagnosis not present

## 2016-07-22 DIAGNOSIS — E7800 Pure hypercholesterolemia, unspecified: Secondary | ICD-10-CM | POA: Insufficient documentation

## 2016-07-22 DIAGNOSIS — G5622 Lesion of ulnar nerve, left upper limb: Secondary | ICD-10-CM | POA: Diagnosis not present

## 2016-07-22 DIAGNOSIS — M542 Cervicalgia: Secondary | ICD-10-CM | POA: Insufficient documentation

## 2016-07-22 DIAGNOSIS — F329 Major depressive disorder, single episode, unspecified: Secondary | ICD-10-CM | POA: Diagnosis not present

## 2016-07-22 DIAGNOSIS — R42 Dizziness and giddiness: Secondary | ICD-10-CM | POA: Diagnosis not present

## 2016-07-22 DIAGNOSIS — F17210 Nicotine dependence, cigarettes, uncomplicated: Secondary | ICD-10-CM | POA: Diagnosis not present

## 2016-07-22 DIAGNOSIS — M1990 Unspecified osteoarthritis, unspecified site: Secondary | ICD-10-CM | POA: Insufficient documentation

## 2016-07-22 DIAGNOSIS — I10 Essential (primary) hypertension: Secondary | ICD-10-CM | POA: Insufficient documentation

## 2016-07-22 DIAGNOSIS — M7989 Other specified soft tissue disorders: Secondary | ICD-10-CM | POA: Insufficient documentation

## 2016-07-22 DIAGNOSIS — R2232 Localized swelling, mass and lump, left upper limb: Secondary | ICD-10-CM | POA: Diagnosis not present

## 2016-07-22 HISTORY — PX: UPPER EXTREMITY VENOGRAPHY: CATH118272

## 2016-07-22 LAB — POCT I-STAT, CHEM 8
BUN: 16 mg/dL (ref 6–20)
Calcium, Ion: 1.16 mmol/L (ref 1.15–1.40)
Chloride: 105 mmol/L (ref 101–111)
Creatinine, Ser: 0.6 mg/dL — ABNORMAL LOW (ref 0.61–1.24)
Glucose, Bld: 214 mg/dL — ABNORMAL HIGH (ref 65–99)
HCT: 51 % (ref 39.0–52.0)
Hemoglobin: 17.3 g/dL — ABNORMAL HIGH (ref 13.0–17.0)
Potassium: 4.7 mmol/L (ref 3.5–5.1)
Sodium: 139 mmol/L (ref 135–145)
TCO2: 28 mmol/L (ref 0–100)

## 2016-07-22 LAB — GLUCOSE, CAPILLARY: Glucose-Capillary: 231 mg/dL — ABNORMAL HIGH (ref 65–99)

## 2016-07-22 SURGERY — UPPER EXTREMITY VENOGRAPHY

## 2016-07-22 MED ORDER — HEPARIN (PORCINE) IN NACL 2-0.9 UNIT/ML-% IJ SOLN
INTRAMUSCULAR | Status: AC
  Filled 2016-07-22: qty 500

## 2016-07-22 MED ORDER — ACETAMINOPHEN 325 MG RE SUPP: 325.0000 mg | RECTAL | Status: DC | PRN

## 2016-07-22 MED ORDER — SODIUM CHLORIDE 0.9 % IV SOLN
INTRAVENOUS | Status: DC
  Administered 2016-07-22: 09:00:00 via INTRAVENOUS

## 2016-07-22 MED ORDER — ACETAMINOPHEN 325 MG PO TABS: 325.0000 mg | ORAL_TABLET | ORAL | Status: DC | PRN

## 2016-07-22 MED ORDER — IODIXANOL 320 MG/ML IV SOLN
INTRAVENOUS | Status: DC | PRN
  Administered 2016-07-22: 43 mL via INTRAVENOUS

## 2016-07-22 SURGICAL SUPPLY — 2 items
STOPCOCK MORSE 400PSI 3WAY (MISCELLANEOUS) ×3 IMPLANT
TUBING CIL FLEX 10 FLL-RA (TUBING) ×3 IMPLANT

## 2016-07-22 NOTE — Op Note (Addendum)
    Patient name: Chase [name] MRN: [id number] DOB: May 24, 1951 Sex: male  07/22/2016 Pre-operative Diagnosis: Possible thoracic outlet syndrome Post-operative diagnosis:  Same Surgeon:  Annamarie Major Procedure Performed:  1.  Bilateral upper extremity and central venogram   Indications:  The patient complains of swelling in his left arm.  There was concern over possible thoracic outlet.  He comes in today for further evaluation  Procedure:  The patient was identified in the holding area and taken to room 8.  The patient was then placed supine on the table and prepped and draped in the usual sterile fashion.  A time out was called.  Contrast injections were performed through the IVs placed in the holding area.  Findings:    Left arm:  The left brachial and cephalic veins are visualized without evidence of stenosis.  Right arm:  The brachial veins are visualized and found to be patent throughout the upper arm.  The cephalic vein is patent in the upper arm with no stenosis and is seen draining into the brachial vein.  Central: No evidence of central venous stenosis      Impression:  #1  no evidence of thoracic outlet obstruction.  The central veins are widely patent     V. Annamarie Major, M.D. [facility] of [place] Office: [phone] Pager:  [phone]

## 2016-07-22 NOTE — H&P (View-Only) (Signed)
Vascular and Vein Specialist of [place]  Patient name: Chase [name] MRN: [id number] DOB: 25-Jun-1950 Sex: male   REFERRING PROVIDER:    Dr. Christella Noa   REASON FOR CONSULT:    Shoulder pain and arm swelling  HISTORY OF PRESENT ILLNESS:   Chase [name] is a 66 y.o. male, who is Referred today for evaluation of pain when he moves his neck as well as pain in his left upper extremity.  He also has swelling in the left arm.  He is status post two-level decompression and ACDF in November 2016.  In March 2017 he underwent L3, L4, L5 PLIF.  He has undergone nerve conduction studies which were normal with the exception of mild left ulnar neuropathy.  There are concerns over possible thoracic outlet syndrome and therefore he is here today.  He describes swelling and decreased strength in the left arm for approximately 5 months.  This is also associated with tingling.  He does complain of dizziness.  He says he has pain in his head particularly when he moves it.  There are no relieving factors.  He denies any history of clotting disorder.  He denies any trauma.  The patient suffers from hypercholesterolemia and is on a statin.  He takes an ACE inhibitor for hypertension.  He is a current smoker.  He is a diabetic with a most recent hemoglobin A1c approximate one year ago 7.0  PAST MEDICAL HISTORY    Past Medical History:  Diagnosis Date  . Arthritis   . Depression   . Diabetes mellitus without complication ([place])   . Headache   . High cholesterol   . Hypertension   . Inguinal hernia    rt  . Numbness and tingling in hands   . Urinary frequency      FAMILY HISTORY   No family history on file.  SOCIAL HISTORY:   Social History   Social History  . Marital status: Married    Spouse name: N/A  . Number of children: N/A  . Years of education: N/A   Occupational History  . Not on file.   Social History Main Topics  . Smoking status:  Current Every Day Smoker    Packs/day: 0.25    Years: 40.00    Types: Cigarettes  . Smokeless tobacco: Never Used  . Alcohol use Yes     Comment: occassionally  . Drug use: No  . Sexual activity: Not on file   Other Topics Concern  . Not on file   Social History Narrative  . No narrative on file    ALLERGIES:    No Known Allergies  CURRENT MEDICATIONS:    Current Outpatient Prescriptions  Medication Sig Dispense Refill  . acetaminophen-codeine (TYLENOL #3) 300-30 MG tablet Take 1 tablet by mouth every 6 (six) hours as needed for moderate pain.    Marland Kitchen glyBURIDE-metformin (GLUCOVANCE) 5-500 MG tablet Take 1 tablet by mouth 3 (three) times daily.     Marland Kitchen lisinopril (PRINIVIL,ZESTRIL) 5 MG tablet Take 5 mg by mouth daily.    . Omega-3 Fatty Acids (FISH OIL PO) Take 1 capsule by mouth 2 (two) times daily.    . pravastatin (PRAVACHOL) 40 MG tablet Take 40 mg by mouth daily.     No current facility-administered medications for this visit.     REVIEW OF SYSTEMS:   [X]  denotes positive finding, [ ]  denotes negative finding Cardiac  Comments:  Chest pain or chest pressure: x   Shortness of  breath upon exertion:    Short of breath when lying flat: x   Irregular heart rhythm: x       Vascular    Pain in calf, thigh, or hip brought on by ambulation: x   Pain in feet at night that wakes you up from your sleep:  x   Blood clot in your veins: x   Leg swelling:  x       Pulmonary    Oxygen at home:    Productive cough:     Wheezing:         Neurologic    Sudden weakness in arms or legs:  x   Sudden numbness in arms or legs:  x   Sudden onset of difficulty speaking or slurred speech: x   Temporary loss of vision in one eye:     Problems with dizziness:  x       Gastrointestinal    Blood in stool:      Vomited blood:         Genitourinary    Burning when urinating:  x   Blood in urine:        Psychiatric    Major depression:         Hematologic    Bleeding  problems:    Problems with blood clotting too easily: x       Skin    Rashes or ulcers:        Constitutional    Fever or chills: x    PHYSICAL EXAM:   Vitals:   06/27/16 0903 06/27/16 0905  BP: 115/73 111/74  Pulse: 68 68  Resp:  16  Temp:  98.1 F (36.7 C)  TempSrc:  Oral  SpO2:  98%  Weight:  158 lb (71.7 kg)  Height:  5\' 3"  (1.6 m)    GENERAL: The patient is a well-nourished male, in no acute distress. The vital signs are documented above. CARDIAC: There is a regular rate and rhythm.  VASCULAR: Left hand edema.  Palpable right radial pulse.  I cannot palpate the left radial pulse however it does have appropriate Doppler signal.  On provocative movements with raising the left arm over the shoulder, there was no significant change in the Doppler signal to his radial artery.  As a palpable brachial pulse. PULMONARY: Nonlabored respirations MUSCULOSKELETAL: There are no major deformities or cyanosis. NEUROLOGIC: No focal weakness or paresthesias are detected. SKIN: There are no ulcers or rashes noted. PSYCHIATRIC: The patient has a normal affect.  STUDIES:   Venous Doppler studies were performed today which showed no evidence of DVT or SVT  Thoracic outlet studies could not be performed today as we could not get preapproval from Medicaid.  He is scheduled to have this done on February 8  ASSESSMENT and PLAN   Possible thoracic outlet syndrome, left colon potentially thoracic outlet syndrome could explain some of the symptoms in his left hand but I do not think it explains all of the symptoms that he is having.  I tried to have ultrasound evaluate him today, however we could not get preauthorization and so the study is scheduled for February 8.  I'm also scheduling him for a bilateral venogram which was performed on February 6 to look for central venous stenosis.  He will follow with me after the studies have been performed.   Annamarie Major, MD Vascular and Vein  Specialists of [facility] [phone] Pager [phone]

## 2016-07-22 NOTE — Interval H&P Note (Signed)
History and Physical Interval Note:  07/22/2016 10:55 AM  [place]  has presented today for surgery, with the diagnosis of left arm pain & swelling  The various methods of treatment have been discussed with the patient and family. After consideration of risks, benefits and other options for treatment, the patient has consented to  Procedure(s): Upper Extremity Venography - bilateral arm (N/A) as a surgical intervention .  The patient's history has been reviewed, patient examined, no change in status, stable for surgery.  I have reviewed the patient's chart and labs.  Questions were answered to the patient's satisfaction.     Annamarie Major

## 2016-07-22 NOTE — Discharge Instructions (Signed)
NO METFORMIN/GLUCOVANCE FOR 2 DAYS   Venogram, Care After Refer to this sheet in the next few weeks. These instructions provide you with information on caring for yourself after your procedure. Your health care provider may also give you more specific instructions. Your treatment has been planned according to current medical practices, but problems sometimes occur. Call your health care provider if you have any problems or questions after your procedure. WHAT TO EXPECT AFTER THE PROCEDURE After your procedure, it is typical to have the following sensations:  Mild discomfort at the catheter insertion site. HOME CARE INSTRUCTIONS   Take all medicines exactly as directed.  Follow any prescribed diet.  Follow instructions regarding both rest and physical activity.  Drink more fluids for the first several days after the procedure in order to help flush dye from your kidneys. SEEK MEDICAL CARE IF:  You develop a rash.  You have fever not controlled by medicine. SEEK IMMEDIATE MEDICAL CARE IF:  There is pain, drainage, bleeding, redness, swelling, warmth or a red streak at the site of the IV tube.  The extremity where your IV tube was placed becomes discolored, numb, or cool.  You have difficulty breathing or shortness of breath.  You develop chest pain.  You have excessive dizziness or fainting. This information is not intended to replace advice given to you by your health care provider. Make sure you discuss any questions you have with your health care provider. Document Released: 03/09/2013 Document Revised: 05/24/2013 Document Reviewed: 03/09/2013 Elsevier Interactive Patient Education  2017 [employer].

## 2016-07-23 MED FILL — Heparin Sodium (Porcine) 2 Unit/ML in Sodium Chloride 0.9%: INTRAMUSCULAR | Qty: 500 | Status: AC

## 2016-07-28 ENCOUNTER — Ambulatory Visit (INDEPENDENT_AMBULATORY_CARE_PROVIDER_SITE_OTHER): Payer: Medicaid Other | Admitting: Surgery

## 2016-07-28 ENCOUNTER — Encounter: Payer: Self-pay | Admitting: Surgery

## 2016-07-28 VITALS — BP 117/80 | HR 66 | Temp 97.7°F | Resp 20 | Ht 63.0 in | Wt 156.5 lb

## 2016-07-28 DIAGNOSIS — G540 Brachial plexus disorders: Secondary | ICD-10-CM

## 2016-07-28 NOTE — Progress Notes (Signed)
Vascular and Vein Specialist of [place]  Patient name: Chase [name]: [id number] DOB: 27-Jul-1950 Sex: male   REASON FOR VISIT:    Follow up  [place]:    Chase [name] is a 66 y.o. male follow-up of his venogram.  There was concern for possible venous thoracic outlet syndrome given his symptoms of swelling in his left arm.  He recently underwent venography which did not show any significant central venous stenosis with and without arm abduction.  The patient continues to complain of stiffness and pain in his neck as well as a feeling of answered crawling around his left hand.   PAST MEDICAL HISTORY:   Past Medical History:  Diagnosis Date  . Arthritis   . Depression   . Diabetes mellitus without complication ([place])   . Headache   . High cholesterol   . Hypertension   . Inguinal hernia    rt  . Numbness and tingling in hands   . Urinary frequency      FAMILY HISTORY:   History reviewed. No pertinent family history.  SOCIAL HISTORY:   Social History  Substance Use Topics  . Smoking status: Current Every Day Smoker    Packs/day: 0.25    Years: 40.00    Types: Cigarettes  . Smokeless tobacco: Never Used  . Alcohol use Yes     Comment: occassionally     ALLERGIES:   No Known Allergies   CURRENT MEDICATIONS:   Current Outpatient Prescriptions  Medication Sig Dispense Refill  . glyBURIDE-metformin (GLUCOVANCE) 5-500 MG tablet Take 1 tablet by mouth 3 (three) times daily.     Marland Kitchen lisinopril (PRINIVIL,ZESTRIL) 5 MG tablet Take 5 mg by mouth daily.    . pravastatin (PRAVACHOL) 40 MG tablet Take 40 mg by mouth daily.    . tamsulosin (FLOMAX) 0.4 MG CAPS capsule Take 0.4 mg by mouth daily.     No current facility-administered medications for this visit.     REVIEW OF SYSTEMS:   [X]  denotes positive finding, [ ]  denotes negative finding Cardiac  Comments:  Chest pain or chest pressure:      Shortness of breath upon exertion:    Short of breath when lying flat:    Irregular heart rhythm:        Vascular    Pain in calf, thigh, or hip brought on by ambulation:    Pain in feet at night that wakes you up from your sleep:     Blood clot in your veins:    Leg swelling:         Pulmonary    Oxygen at home:    Productive cough:     Wheezing:         Neurologic    Sudden weakness in arms or legs:     Sudden numbness in arms or legs:     Sudden onset of difficulty speaking or slurred speech:    Temporary loss of vision in one eye:     Problems with dizziness:         Gastrointestinal    Blood in stool:     Vomited blood:         Genitourinary    Burning when urinating:     Blood in urine:        Psychiatric    Major depression:         Hematologic    Bleeding problems:    Problems with blood clotting too  easily:        Skin    Rashes or ulcers:        Constitutional    Fever or chills:      PHYSICAL EXAM:   Vitals:   07/28/16 0846  BP: 117/80  Pulse: 66  Resp: 20  Temp: 97.7 F (36.5 C)  TempSrc: Oral  SpO2: 96%  Weight: 156 lb 8 oz (71 kg)  Height: 5\' 3"  (1.6 m)    GENERAL: The patient is a well-nourished male, in no acute distress. The vital signs are documented above. CARDIAC: There is a regular rate and rhythm.  VASCULAR: Palpable left radial pulse, mild edema to left hand PULMONARY: Non-labored respirations MUSCULOSKELETAL: There are no major deformities or cyanosis. NEUROLOGIC: No focal weakness or paresthesias are detected. SKIN: There are no ulcers or rashes noted. PSYCHIATRIC: The patient has a normal affect.  STUDIES:   Venogram shows no evidence of central venous stenosis  MEDICAL ISSUES:   I discussed with the patient via the interpreter that I do not think his symptoms are explained by venous or arterial thoracic outlet syndrome.  I am referring him to [facility] for a upper arm compression garment to help minimize the edema in  his left arm.  He is going to follow-up with Dr. Christella Noa in the immediate future.    Annamarie Major, MD [facility] of [facility] [phone] Pager [phone]

## 2016-10-30 ENCOUNTER — Other Ambulatory Visit (HOSPITAL_COMMUNITY): Payer: Self-pay | Admitting: Family

## 2016-10-30 ENCOUNTER — Ambulatory Visit (HOSPITAL_COMMUNITY)
Admission: RE | Admit: 2016-10-30 | Discharge: 2016-10-30 | Disposition: A | Discharge status: Home / Self Care | Payer: Medicaid Other | Source: Ambulatory Visit | Attending: Family | Admitting: Family

## 2016-10-30 DIAGNOSIS — R52 Pain, unspecified: Secondary | ICD-10-CM

## 2016-10-30 DIAGNOSIS — M1712 Unilateral primary osteoarthritis, left knee: Secondary | ICD-10-CM | POA: Insufficient documentation

## 2016-10-30 DIAGNOSIS — M25562 Pain in left knee: Secondary | ICD-10-CM | POA: Diagnosis present

## 2017-02-12 ENCOUNTER — Emergency Department (HOSPITAL_COMMUNITY): Payer: Medicaid Other

## 2017-02-12 ENCOUNTER — Emergency Department (HOSPITAL_COMMUNITY)
Admission: EM | Admit: 2017-02-12 | Discharge: 2017-02-12 | Disposition: A | Discharge status: Home / Self Care | Payer: Medicaid Other | Attending: Emergency Medicine | Admitting: Emergency Medicine

## 2017-02-12 ENCOUNTER — Encounter (HOSPITAL_COMMUNITY): Payer: Self-pay | Admitting: Emergency Medicine

## 2017-02-12 DIAGNOSIS — M5440 Lumbago with sciatica, unspecified side: Secondary | ICD-10-CM | POA: Insufficient documentation

## 2017-02-12 DIAGNOSIS — Z79899 Other long term (current) drug therapy: Secondary | ICD-10-CM | POA: Diagnosis not present

## 2017-02-12 DIAGNOSIS — Z8546 Personal history of malignant neoplasm of prostate: Secondary | ICD-10-CM | POA: Diagnosis not present

## 2017-02-12 DIAGNOSIS — E119 Type 2 diabetes mellitus without complications: Secondary | ICD-10-CM | POA: Diagnosis not present

## 2017-02-12 DIAGNOSIS — Z7984 Long term (current) use of oral hypoglycemic drugs: Secondary | ICD-10-CM | POA: Insufficient documentation

## 2017-02-12 DIAGNOSIS — I10 Essential (primary) hypertension: Secondary | ICD-10-CM | POA: Insufficient documentation

## 2017-02-12 DIAGNOSIS — M545 Low back pain: Secondary | ICD-10-CM | POA: Diagnosis present

## 2017-02-12 DIAGNOSIS — Z87891 Personal history of nicotine dependence: Secondary | ICD-10-CM | POA: Insufficient documentation

## 2017-02-12 HISTORY — DX: Malignant (primary) neoplasm, unspecified: C80.1

## 2017-02-12 HISTORY — DX: Cervicalgia: M54.2

## 2017-02-12 LAB — CBC WITH DIFFERENTIAL/PLATELET
Basophils Absolute: 0 10*3/uL (ref 0.0–0.1)
Basophils Relative: 0 %
Eosinophils Absolute: 0.4 10*3/uL (ref 0.0–0.7)
Eosinophils Relative: 5 %
HCT: 47.9 % (ref 39.0–52.0)
Hemoglobin: 16.1 g/dL (ref 13.0–17.0)
Lymphocytes Relative: 25 %
Lymphs Abs: 1.8 10*3/uL (ref 0.7–4.0)
MCH: 29.7 pg (ref 26.0–34.0)
MCHC: 33.6 g/dL (ref 30.0–36.0)
MCV: 88.4 fL (ref 78.0–100.0)
Monocytes Absolute: 0.6 10*3/uL (ref 0.1–1.0)
Monocytes Relative: 9 %
Neutro Abs: 4.5 10*3/uL (ref 1.7–7.7)
Neutrophils Relative %: 61 %
Platelets: 217 10*3/uL (ref 150–400)
RBC: 5.42 MIL/uL (ref 4.22–5.81)
RDW: 13.8 % (ref 11.5–15.5)
WBC: 7.3 10*3/uL (ref 4.0–10.5)

## 2017-02-12 LAB — BASIC METABOLIC PANEL
Anion gap: 9 (ref 5–15)
BUN: 19 mg/dL (ref 6–20)
CO2: 25 mmol/L (ref 22–32)
Calcium: 9.1 mg/dL (ref 8.9–10.3)
Chloride: 103 mmol/L (ref 101–111)
Creatinine, Ser: 0.7 mg/dL (ref 0.61–1.24)
GFR calc Af Amer: >60 mL/min (ref >60)
GFR calc non Af Amer: >60 mL/min (ref >60)
Glucose, Bld: 153 mg/dL — ABNORMAL HIGH (ref 65–99)
Potassium: 4 mmol/L (ref 3.5–5.1)
Sodium: 137 mmol/L (ref 135–145)

## 2017-02-12 MED ORDER — HYDROMORPHONE HCL 1 MG/ML IJ SOLN: 1.0000 mg | Freq: Once | INTRAMUSCULAR | Status: DC

## 2017-02-12 MED ORDER — HYDROCODONE-ACETAMINOPHEN 5-325 MG PO TABS: 1.0000 | ORAL_TABLET | Freq: Four times a day (QID) | ORAL | 0 refills | Status: DC | PRN

## 2017-02-12 MED ORDER — SODIUM CHLORIDE 0.9 % IV SOLN
INTRAVENOUS | Status: DC
  Administered 2017-02-12: 14:00:00 via INTRAVENOUS

## 2017-02-12 MED ORDER — ONDANSETRON HCL 4 MG/2ML IJ SOLN
4.0000 mg | Freq: Once | INTRAMUSCULAR | Status: AC
  Administered 2017-02-12: 4 mg via INTRAVENOUS
  Filled 2017-02-12: qty 2

## 2017-02-12 MED ORDER — HYDROMORPHONE HCL 1 MG/ML IJ SOLN
1.0000 mg | Freq: Once | INTRAMUSCULAR | Status: AC
  Administered 2017-02-12: 1 mg via INTRAVENOUS
  Filled 2017-02-12: qty 1

## 2017-02-12 MED ORDER — PREDNISONE 10 MG PO TABS: 40.0000 mg | ORAL_TABLET | Freq: Every day | ORAL | 0 refills | Status: DC

## 2017-02-12 MED ORDER — FENTANYL CITRATE (PF) 100 MCG/2ML IJ SOLN
50.0000 ug | Freq: Once | INTRAMUSCULAR | Status: AC
  Administered 2017-02-12: 50 ug via INTRAVENOUS
  Filled 2017-02-12: qty 2

## 2017-02-12 MED ORDER — PREDNISONE 50 MG PO TABS
60.0000 mg | ORAL_TABLET | Freq: Once | ORAL | Status: AC
  Administered 2017-02-12: 19:00:00 60 mg via ORAL
  Filled 2017-02-12: qty 1

## 2017-02-12 NOTE — ED Provider Notes (Signed)
[facility] DEPT Provider Note   CSN: [id number] Arrival date & time: 02/12/17  1053     History   Chief Complaint Chief Complaint  Patient presents with  . Back Pain  . Extremity Weakness    HPI Chase [name] is a 66 y.o. male.  Patient with complaint of back pain for about 10 days. And some weakness in his legs. Patient also had a fall 3 days ago. Which made things worse. No bowel or bladder incontinence. Patient had back surgery done about a year ago by neurosurgery Dr. Cyndy Freeze. Patient denies any numbness to lower extremities. Just weakness and pain. In complaint of low back pain is predominantly on the right side.      Past Medical History:  Diagnosis Date  . Arthritis   . Cancer [facility])    prostate 2017  . Depression   . Diabetes mellitus without complication ([place])   . Headache   . High cholesterol   . Hypertension   . Inguinal hernia    rt  . Neck pain   . Numbness and tingling in hands   . Urinary frequency     Patient Active Problem List   Diagnosis Date Noted  . Osteoarthritis of spine with radiculopathy, lumbar region 08/01/2015  . HNP (herniated nucleus pulposus) with myelopathy, cervical 04/04/2015    Past Surgical History:  Procedure Laterality Date  . ANTERIOR CERVICAL DECOMP/DISCECTOMY FUSION N/A 04/04/2015   Procedure: ANTERIOR CERVICAL DECOMPRESSION/DISCECTOMY FUSION PLATING BONEGRAFT CERVICAL FOUR-FIVE CERVICAL FIVE-SIX;  Surgeon: Ashok Pall, MD;  Location: [facility];  Service: Neurosurgery;  Laterality: N/A;  . SHOULDER ARTHROSCOPY Right   . UPPER EXTREMITY VENOGRAPHY N/A 07/22/2016   Procedure: Upper Extremity Venography - bilateral arm;  Surgeon: Serafina Mitchell, MD;  Location: [facility];  Service: Cardiovascular;  Laterality: N/A;       Home Medications    Prior to Admission medications   Medication Sig Start Date End Date Taking? Authorizing Provider  atorvastatin (LIPITOR) 40 MG tablet Take 40 mg by mouth daily.    Yes [provider]  glipiZIDE (GLUCOTROL) 10 MG tablet Take 10 mg by mouth daily before breakfast. 1 tablet in morning and 1/2 tablet in the evening   Yes [provider]  lisinopril (PRINIVIL,ZESTRIL) 5 MG tablet Take 5 mg by mouth daily.   Yes [provider]  metFORMIN (GLUCOPHAGE) 1000 MG tablet Take 1,000 mg by mouth 2 (two) times daily with a meal. 1 tablet in morning and 1/2 tablet in evening   Yes [provider]  naproxen (NAPROSYN) 500 MG tablet Take 500 mg by mouth 2 (two) times daily with a meal.   Yes [provider]    Family History No family history on file.  Social History Social History  Substance Use Topics  . Smoking status: Former Smoker    Packs/day: 0.25    Years: 40.00    Types: Cigarettes  . Smokeless tobacco: Never Used  . Alcohol use Yes     Comment: occassionally     Allergies   Patient has no known allergies.   Review of Systems Review of Systems  Constitutional: Negative for fever.  HENT: Negative for congestion.   Eyes: Negative for redness.  Respiratory: Negative for shortness of breath.   Cardiovascular: Negative for chest pain.  Gastrointestinal: Negative for abdominal pain.  Genitourinary: Negative for dysuria.  Musculoskeletal: Positive for back pain.  Skin: Negative for rash.  Neurological: Positive for weakness.  Hematological:  Does not bruise/bleed easily.  Psychiatric/Behavioral: Negative for confusion.     Physical Exam Updated Vital Signs BP 129/80 (BP Location: Right Arm)   Pulse 62   Temp 98.2 F (36.8 C) (Oral)   Resp 16   Wt 70.3 kg (155 lb)   SpO2 99%   BMI 27.46 kg/m   Physical Exam  Constitutional: He is oriented to person, place, and time. He appears well-developed and well-nourished. No distress.  HENT:  Head: Normocephalic and atraumatic.  Mouth/Throat: Oropharynx is clear and moist.  Eyes: Pupils are equal, round, and reactive to light. Conjunctivae and EOM  are normal.  Neck: Normal range of motion. Neck supple.  Cardiovascular: Normal rate, regular rhythm and normal heart sounds.   Pulmonary/Chest: Effort normal and breath sounds normal. No respiratory distress.  Abdominal: Soft. Bowel sounds are normal. There is no tenderness.  Musculoskeletal: He exhibits no edema or deformity.  Tenderness to palpation right lower lumbar area.  Neurological: He is alert and oriented to person, place, and time. No cranial nerve deficit or sensory deficit. He exhibits normal muscle tone.  Patient with good movement of legs bilaterally no significant weakness. Sensation appears to be intact.  Nursing note and vitals reviewed.    ED Treatments / Results  Labs (all labs ordered are listed, but only abnormal results are displayed) Labs Reviewed  BASIC METABOLIC PANEL - Abnormal; Notable for the following:       Result Value   Glucose, Bld 153 (*)    All other components within normal limits  CBC WITH DIFFERENTIAL/PLATELET    EKG  EKG Interpretation None       Radiology Ct Lumbar Spine Wo Contrast  Result Date: 02/12/2017 CLINICAL DATA:  Lower extremity weakness for 10-12 days. Fall 3 days ago. Back pain. EXAM: CT LUMBAR SPINE WITHOUT CONTRAST TECHNIQUE: Multidetector CT imaging of the lumbar spine was performed without intravenous contrast administration. Multiplanar CT image reconstructions were also generated. COMPARISON:  03/01/2015 lumbar spine MRI FINDINGS: Segmentation: 5 lumbar type vertebrae. Alignment: Normal. Vertebrae: Negative for acute fracture. No endplate erosion or evidence of focal bone lesion. L3-4 and L4-5 PLIF with rod and pedicle screw fixation. There is interbody solid bridging bone across the cages. No evidence of hardware loosening or fracture. Paraspinal and other soft tissues: Atherosclerotic calcifications. Renal cystic densities on the right. Disc levels: T12- L1: Unremarkable. L1-L2: Chronic right paracentral disc protrusion,  without noted progression since 2016. No high-grade stenosis. No significant facet arthropathy. Patent foramina L2-L3: New disc bulge and ligamentum flavum thickening attributed to adjacent segment disease. At least moderate spinal stenosis. Bilateral foraminal narrowing. L3-L4: PLIF.  No residual bony stenosis. L4-L5: PLIF. No significant residual bony stenosis. Residual spurs encroach on the right foramen, but no high-grade stenosis. Right foraminal patency is significantly improved from 2016. L5-S1:Advanced facet arthropathy with bulky facet spurring. No evidence of herniation. Patent spinal canal. Foraminal narrowing that is mild on the right and mild to moderate on the left. IMPRESSION: 1. No acute osseous finding. 2. 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 3. L3-4 and L4-5 PLIF with solid arthrodesis. Significantly improved canal and foraminal patency. No evidence of hardware failure. 4. L5-S1 advanced facet arthropathy with left more than right foraminal narrowing. 5. L1-2 chronic right paracentral disc protrusion. Electronically Signed   By: Monte Fantasia M.D.   On: 02/12/2017 13:20   Mr Lumbar Spine Wo Contrast  Result Date: 02/12/2017 CLINICAL DATA:  Initial evaluation  for acute back pain status post recent fall 3 days ago. EXAM: MRI LUMBAR SPINE WITHOUT CONTRAST TECHNIQUE: Multiplanar, multisequence MR imaging of the lumbar spine was performed. No intravenous contrast was administered. COMPARISON:  Prior CT from earlier the same day as well as previous MRI from 03/01/2015. FINDINGS: Segmentation: Normal segmentation. Lowest well-formed disc labeled the L5-S1 level. Alignment: Mild straightening of the normal lumbar lordosis. Trace retrolisthesis of L2 on L3. Vertebrae: Vertebral body heights maintained. No evidence for acute, subacute, or chronic fracture. Sequelae of posterior decompression with fusion present at L3 through L5.  Susceptibility artifact from transpedicular screws limits evaluation at these levels. Bone marrow signal intensity within normal limits. No discrete or worrisome osseous lesions. Conus medullaris: Extends to the L2 level and appears normal. Paraspinal and other soft tissues: Edema present within the lower paraspinous musculature bilaterally (series 5, image 12, 2). Paraspinous soft tissues otherwise within normal limits. Subcentimeter T2 hyperintense cyst noted within the right kidney. Visualized visceral structures otherwise unremarkable. Disc levels: L1-2: Chronic right paracentral disc extrusion with superior migration, relatively similar in appearance as compared to previous MRI. Unchanged mild spinal stenosis. No significant foraminal encroachment. L2-3: Trace retrolisthesis, new relative to previous MRI from 2016. Progressive degenerative disc bulging with disc desiccation and intervertebral disc space narrowing. Disc bulging a centric to the right with broad right extraforaminal component. Progressive moderate facet and ligamentum flavum hypertrophy. Resultant moderate canal and bilateral subarticular stenosis, worsened from previous. Thecal sac measures 7-8 mm in AP diameter. Moderate bilateral neural foraminal narrowing, also progressed from previous. L3-4: Status post posterior and interbody fusion with posterior decompression. No residual canal or foraminal stenosis. L4-5: Status post posterior and interbody fusion with posterior decompression. No residual canal stenosis. Residual endplate spurring encroaches upon the right neural foramen with persistent moderate right L4 foraminal stenosis, improved relative to 2016. No significant left foraminal narrowing. L5-S1: No significant disc bulge. Advanced bilateral facet arthropathy with bulky overgrowth. No significant canal or subarticular stenosis. Mild to moderate bilateral L5 foraminal narrowing, slightly greater on the left. IMPRESSION: 1. Soft tissue  edema within the bilateral lower paraspinous musculature, which may reflect muscular strain and/or injury. 2. No other acute abnormality within the lumbar spine. No other traumatic injury status post recent fall. 3. 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 4. L3-4 and L4-5 PLIF without residual canal stenosis. Moderate right L4 foraminal stenosis related to residual endplate spurring. 5. Advanced facet arthropathy at L5-S1 with mild to moderate bilateral foraminal stenosis, left greater than right. 6. 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 Electronically Signed   By: Jeannine Boga M.D.   On: 02/12/2017 18:18    Procedures Procedures (including critical care time)  Medications Ordered in ED Medications  0.9 %  sodium chloride infusion ( Intravenous Stopped 02/12/17 1833)  HYDROmorphone (DILAUDID) injection 1 mg (1 mg Intravenous Refused 02/12/17 1700)  ondansetron (ZOFRAN) injection 4 mg (4 mg Intravenous Given 02/12/17 1336)  fentaNYL (SUBLIMAZE) injection 50 mcg (50 mcg Intravenous Given 02/12/17 1336)  HYDROmorphone (DILAUDID) injection 1 mg (1 mg Intravenous Given 02/12/17 1429)  predniSONE (DELTASONE) tablet 60 mg (60 mg Oral Given 02/12/17 1852)     Initial Impression / Assessment and Plan / ED Course  I have reviewed the triage vital signs and the nursing notes.  Pertinent labs & imaging results that were available during my care of the patient were reviewed by me and considered in my medical  decision making (see chart for details).    Patient status post back surgery a year ago by Dr. Cyndy Freeze neurosurgery. This was actually in March 2017. Patient presents today with the complaint of low back pain and pain with his legs with walking. And felt that his extremities have been weak for the past the proximally 10 days. He had a fall 3 days ago which made things worse. Patient without any bowel or  urinary incontinence.  Extensive workup here included CT lumbar area and MRI of lumbar area. There is evidence of nerve irritation L2-L3 area. No evidence of any bony injuries. No evidence of any significant central cord impingement or cauda equina. Patient's pain was treated with medication here with improvement in pain at rest. Patient still had some difficulty walking. This led to doing the MRI after the negative CT.  Patient retreated with pain medication and will be started on steroids. Will be important for patient to follow-up with Dr. Cyndy Freeze. rest is much as possible returning for any new or worse symptoms. Discussed with patient's family they understand.   Patient will take a 5 day course of prednisone. Has hydrocodone to take for pain at home. We'll call neurosurgery for follow-up tomorrow. Will return for any new or worse symptoms.  Final Clinical Impressions(s) / ED Diagnoses   Final diagnoses:  Acute bilateral low back pain, with sciatica presence unspecified    New Prescriptions New Prescriptions   No medications on file     Fredia Sorrow, MD 02/12/17 1954

## 2017-02-12 NOTE — ED Notes (Signed)
Patient transported to MRI 

## 2017-02-12 NOTE — ED Notes (Signed)
Pt informed family is on the way to pick him up. No needs voiced.

## 2017-02-12 NOTE — Discharge Instructions (Signed)
Workup here today included CT scan of the low back area without any acute bony injuries. Since pain persisted did MRI of lumbar area. This does show some areas of some nerve irritation. But shows no evidence of any acute emergency. Will attempt to treat pain with hydrocodone and prednisone to cut down the inflammation. Patient needs to follow back up with Dr. Cyndy Freeze his neurosurgeon. Return for any new or worse symptoms.Keep in mind that the prednisone will increase his blood sugars. This can be problematic for diabetic patients.

## 2017-02-12 NOTE — ED Notes (Signed)
Chase [name] [phone]

## 2017-02-12 NOTE — ED Notes (Signed)
Attempted to ambulate pt.  Pt unable to take more than 3 steps.  Drags left foot and states the pain is unbearable.  Pt denies pain with no movement.  MD made aware and MRI ordered.  Test explained to pt using translator service.  Pt denied need for pain medication at this time as he is not trying to walk.

## 2017-02-12 NOTE — ED Notes (Signed)
Pt updated on waiting for doctor. Pt resting at this time.

## 2017-02-12 NOTE — ED Notes (Signed)
EDP went oven discharge information. Family member translated. Pt had no questions at discharge. Pt left department in wheelchair escorted by staff.

## 2017-02-12 NOTE — ED Triage Notes (Signed)
Pt brought in by EMS for lower extremity weakness for 10 -12 days .[employer] interpreter. Pt reports that he fell 3 days ago and has been unable to stand and has to use furniture to walk around. Denies bowel/urinary incontinence.. Pt states he had prostate sx last year  and yet feels this may have something to do with weakness. Pain in back worse with standing

## 2017-02-12 NOTE — ED Notes (Signed)
Pt returned from radiology at this time.  

## 2017-09-08 DIAGNOSIS — M48062 Spinal stenosis, lumbar region with neurogenic claudication: Secondary | ICD-10-CM | POA: Diagnosis not present

## 2017-09-10 DIAGNOSIS — C61 Malignant neoplasm of prostate: Secondary | ICD-10-CM | POA: Diagnosis not present

## 2017-09-15 DIAGNOSIS — N400 Enlarged prostate without lower urinary tract symptoms: Secondary | ICD-10-CM | POA: Diagnosis not present

## 2017-09-15 DIAGNOSIS — Z8546 Personal history of malignant neoplasm of prostate: Secondary | ICD-10-CM | POA: Diagnosis not present

## 2017-10-08 DIAGNOSIS — Z6825 Body mass index (BMI) 25.0-25.9, adult: Secondary | ICD-10-CM | POA: Diagnosis not present

## 2017-10-08 DIAGNOSIS — M48062 Spinal stenosis, lumbar region with neurogenic claudication: Secondary | ICD-10-CM | POA: Diagnosis not present

## 2017-10-27 DIAGNOSIS — M48062 Spinal stenosis, lumbar region with neurogenic claudication: Secondary | ICD-10-CM | POA: Diagnosis not present

## 2017-12-07 DIAGNOSIS — I10 Essential (primary) hypertension: Secondary | ICD-10-CM | POA: Diagnosis not present

## 2017-12-07 DIAGNOSIS — M4726 Other spondylosis with radiculopathy, lumbar region: Secondary | ICD-10-CM | POA: Diagnosis not present

## 2017-12-07 DIAGNOSIS — Z6825 Body mass index (BMI) 25.0-25.9, adult: Secondary | ICD-10-CM | POA: Diagnosis not present

## 2017-12-07 DIAGNOSIS — M48062 Spinal stenosis, lumbar region with neurogenic claudication: Secondary | ICD-10-CM | POA: Diagnosis not present

## 2017-12-15 DIAGNOSIS — M48062 Spinal stenosis, lumbar region with neurogenic claudication: Secondary | ICD-10-CM | POA: Diagnosis not present

## 2017-12-15 DIAGNOSIS — M545 Low back pain: Secondary | ICD-10-CM | POA: Diagnosis not present

## 2017-12-18 DIAGNOSIS — E119 Type 2 diabetes mellitus without complications: Secondary | ICD-10-CM | POA: Diagnosis not present

## 2017-12-18 DIAGNOSIS — K5909 Other constipation: Secondary | ICD-10-CM | POA: Diagnosis not present

## 2017-12-18 DIAGNOSIS — R311 Benign essential microscopic hematuria: Secondary | ICD-10-CM | POA: Diagnosis not present

## 2017-12-18 DIAGNOSIS — K5900 Constipation, unspecified: Secondary | ICD-10-CM | POA: Diagnosis not present

## 2017-12-18 DIAGNOSIS — K859 Acute pancreatitis without necrosis or infection, unspecified: Secondary | ICD-10-CM | POA: Diagnosis not present

## 2017-12-18 DIAGNOSIS — Z79899 Other long term (current) drug therapy: Secondary | ICD-10-CM | POA: Diagnosis not present

## 2017-12-18 DIAGNOSIS — K760 Fatty (change of) liver, not elsewhere classified: Secondary | ICD-10-CM | POA: Diagnosis not present

## 2017-12-18 DIAGNOSIS — Z7984 Long term (current) use of oral hypoglycemic drugs: Secondary | ICD-10-CM | POA: Diagnosis not present

## 2017-12-18 DIAGNOSIS — F17200 Nicotine dependence, unspecified, uncomplicated: Secondary | ICD-10-CM | POA: Diagnosis not present

## 2017-12-18 DIAGNOSIS — I700 Atherosclerosis of aorta: Secondary | ICD-10-CM | POA: Diagnosis not present

## 2017-12-19 DIAGNOSIS — R069 Unspecified abnormalities of breathing: Secondary | ICD-10-CM | POA: Diagnosis not present

## 2017-12-19 DIAGNOSIS — Z8546 Personal history of malignant neoplasm of prostate: Secondary | ICD-10-CM | POA: Diagnosis not present

## 2017-12-19 DIAGNOSIS — R0600 Dyspnea, unspecified: Secondary | ICD-10-CM | POA: Diagnosis not present

## 2017-12-19 DIAGNOSIS — Z7984 Long term (current) use of oral hypoglycemic drugs: Secondary | ICD-10-CM | POA: Diagnosis not present

## 2017-12-19 DIAGNOSIS — Z720 Tobacco use: Secondary | ICD-10-CM | POA: Diagnosis not present

## 2017-12-19 DIAGNOSIS — R05 Cough: Secondary | ICD-10-CM | POA: Diagnosis not present

## 2017-12-19 DIAGNOSIS — E119 Type 2 diabetes mellitus without complications: Secondary | ICD-10-CM | POA: Diagnosis not present

## 2017-12-19 DIAGNOSIS — F17200 Nicotine dependence, unspecified, uncomplicated: Secondary | ICD-10-CM | POA: Diagnosis not present

## 2017-12-19 DIAGNOSIS — Z79899 Other long term (current) drug therapy: Secondary | ICD-10-CM | POA: Diagnosis not present

## 2017-12-19 DIAGNOSIS — R0602 Shortness of breath: Secondary | ICD-10-CM | POA: Diagnosis not present

## 2017-12-19 DIAGNOSIS — R10813 Right lower quadrant abdominal tenderness: Secondary | ICD-10-CM | POA: Diagnosis not present

## 2017-12-19 DIAGNOSIS — E7800 Pure hypercholesterolemia, unspecified: Secondary | ICD-10-CM | POA: Diagnosis not present

## 2017-12-19 DIAGNOSIS — R0789 Other chest pain: Secondary | ICD-10-CM | POA: Diagnosis not present

## 2017-12-19 DIAGNOSIS — R0682 Tachypnea, not elsewhere classified: Secondary | ICD-10-CM | POA: Diagnosis not present

## 2017-12-22 ENCOUNTER — Encounter: Payer: Self-pay | Admitting: Family Medicine

## 2017-12-22 ENCOUNTER — Emergency Department (HOSPITAL_COMMUNITY): Payer: Medicare Other

## 2017-12-22 ENCOUNTER — Ambulatory Visit: Payer: Self-pay | Admitting: Family Medicine

## 2017-12-22 ENCOUNTER — Encounter (HOSPITAL_COMMUNITY): Payer: Self-pay | Admitting: *Deleted

## 2017-12-22 ENCOUNTER — Emergency Department (HOSPITAL_COMMUNITY)
Admission: EM | Admit: 2017-12-22 | Discharge: 2017-12-22 | Disposition: A | Discharge status: Home / Self Care | Payer: Medicare Other | Attending: Emergency Medicine | Admitting: Emergency Medicine

## 2017-12-22 ENCOUNTER — Other Ambulatory Visit: Payer: Self-pay

## 2017-12-22 ENCOUNTER — Ambulatory Visit (INDEPENDENT_AMBULATORY_CARE_PROVIDER_SITE_OTHER): Payer: Medicare Other | Admitting: Family Medicine

## 2017-12-22 VITALS — BP 115/85 | HR 102 | Temp 98.0°F | Ht 63.0 in | Wt 150.0 lb

## 2017-12-22 DIAGNOSIS — K5900 Constipation, unspecified: Secondary | ICD-10-CM | POA: Diagnosis not present

## 2017-12-22 DIAGNOSIS — E119 Type 2 diabetes mellitus without complications: Secondary | ICD-10-CM | POA: Diagnosis not present

## 2017-12-22 DIAGNOSIS — R0600 Dyspnea, unspecified: Secondary | ICD-10-CM | POA: Diagnosis not present

## 2017-12-22 DIAGNOSIS — Z79899 Other long term (current) drug therapy: Secondary | ICD-10-CM | POA: Diagnosis not present

## 2017-12-22 DIAGNOSIS — R1084 Generalized abdominal pain: Secondary | ICD-10-CM | POA: Diagnosis not present

## 2017-12-22 DIAGNOSIS — Z8546 Personal history of malignant neoplasm of prostate: Secondary | ICD-10-CM | POA: Diagnosis not present

## 2017-12-22 DIAGNOSIS — R079 Chest pain, unspecified: Secondary | ICD-10-CM

## 2017-12-22 DIAGNOSIS — R1013 Epigastric pain: Secondary | ICD-10-CM | POA: Diagnosis not present

## 2017-12-22 DIAGNOSIS — F17210 Nicotine dependence, cigarettes, uncomplicated: Secondary | ICD-10-CM | POA: Diagnosis not present

## 2017-12-22 DIAGNOSIS — I10 Essential (primary) hypertension: Secondary | ICD-10-CM | POA: Insufficient documentation

## 2017-12-22 DIAGNOSIS — R0689 Other abnormalities of breathing: Secondary | ICD-10-CM | POA: Diagnosis not present

## 2017-12-22 LAB — CBC WITH DIFFERENTIAL/PLATELET
Basophils Absolute: 0 10*3/uL (ref 0.0–0.1)
Basophils Relative: 0 %
Eosinophils Absolute: 0.2 10*3/uL (ref 0.0–0.7)
Eosinophils Relative: 2 %
HCT: 47.6 % (ref 39.0–52.0)
Hemoglobin: 16.1 g/dL (ref 13.0–17.0)
Lymphocytes Relative: 15 %
Lymphs Abs: 1.8 10*3/uL (ref 0.7–4.0)
MCH: 31 pg (ref 26.0–34.0)
MCHC: 33.8 g/dL (ref 30.0–36.0)
MCV: 91.7 fL (ref 78.0–100.0)
Monocytes Absolute: 0.7 10*3/uL (ref 0.1–1.0)
Monocytes Relative: 6 %
Neutro Abs: 9.4 10*3/uL — ABNORMAL HIGH (ref 1.7–7.7)
Neutrophils Relative %: 77 %
Platelets: 195 10*3/uL (ref 150–400)
RBC: 5.19 MIL/uL (ref 4.22–5.81)
RDW: 14.6 % (ref 11.5–15.5)
WBC: 12.2 10*3/uL — ABNORMAL HIGH (ref 4.0–10.5)

## 2017-12-22 LAB — URINALYSIS, ROUTINE W REFLEX MICROSCOPIC
Bacteria, UA: NONE SEEN
Bilirubin Urine: NEGATIVE
Glucose, UA: NEGATIVE mg/dL
Ketones, ur: NEGATIVE mg/dL
Leukocytes, UA: NEGATIVE
Nitrite: NEGATIVE
Protein, ur: NEGATIVE mg/dL
Specific Gravity, Urine: 1.008 (ref 1.005–1.030)
pH: 8 (ref 5.0–8.0)

## 2017-12-22 LAB — COMPREHENSIVE METABOLIC PANEL
ALT: 39 U/L (ref 0–44)
AST: 23 U/L (ref 15–41)
Albumin: 4.1 g/dL (ref 3.5–5.0)
Alkaline Phosphatase: 53 U/L (ref 38–126)
Anion gap: 7 (ref 5–15)
BUN: 13 mg/dL (ref 8–23)
CO2: 26 mmol/L (ref 22–32)
Calcium: 9 mg/dL (ref 8.9–10.3)
Chloride: 106 mmol/L (ref 98–111)
Creatinine, Ser: 0.64 mg/dL (ref 0.61–1.24)
GFR calc Af Amer: >60 mL/min (ref >60)
GFR calc non Af Amer: >60 mL/min (ref >60)
Glucose, Bld: 52 mg/dL — ABNORMAL LOW (ref 70–99)
Potassium: 3.9 mmol/L (ref 3.5–5.1)
Sodium: 139 mmol/L (ref 135–145)
Total Bilirubin: 0.6 mg/dL (ref 0.3–1.2)
Total Protein: 6.5 g/dL (ref 6.5–8.1)

## 2017-12-22 LAB — CBG MONITORING, ED
Glucose-Capillary: 32 mg/dL — CL (ref 70–99)
Glucose-Capillary: 212 mg/dL — ABNORMAL HIGH (ref 70–99)

## 2017-12-22 LAB — TROPONIN I: Troponin I: <0.03 ng/mL

## 2017-12-22 LAB — LIPASE, BLOOD: Lipase: 35 U/L (ref 11–51)

## 2017-12-22 MED ORDER — ONDANSETRON HCL 4 MG/2ML IJ SOLN
4.0000 mg | Freq: Once | INTRAMUSCULAR | Status: AC
  Administered 2017-12-22: 4 mg via INTRAVENOUS
  Filled 2017-12-22: qty 2

## 2017-12-22 MED ORDER — DEXTROSE 50 % IV SOLN
INTRAVENOUS | Status: AC
  Administered 2017-12-22: 50 mL
  Filled 2017-12-22: qty 50

## 2017-12-22 MED ORDER — POLYETHYLENE GLYCOL 3350 17 G PO PACK: 17.0000 g | PACK | Freq: Every day | ORAL | 0 refills | Status: DC

## 2017-12-22 MED ORDER — SODIUM CHLORIDE 0.9 % IV SOLN
1000.0000 mL | Freq: Once | INTRAVENOUS | Status: AC
  Administered 2017-12-22: 1000 mL via INTRAVENOUS

## 2017-12-22 MED ORDER — FLEET ENEMA 7-19 GM/118ML RE ENEM
1.0000 | ENEMA | Freq: Once | RECTAL | Status: AC
Start: 2017-12-22 — End: 2017-12-22
  Administered 2017-12-22: 1 via RECTAL

## 2017-12-22 MED ORDER — DOCUSATE SODIUM 100 MG PO CAPS: 100.0000 mg | ORAL_CAPSULE | Freq: Two times a day (BID) | ORAL | 0 refills | Status: DC

## 2017-12-22 NOTE — ED Notes (Addendum)
Have paged [facility] to bring [facility] [employer]

## 2017-12-22 NOTE — Progress Notes (Signed)
Subjective: CC: chest pain PCP: House, Deliah Goody, FNP [id number] Hilary Hertz is a 67 y.o. male presenting to clinic today for:  Spanish translation is provided by his family member, who is present today.  1. Chest pain Patient is an unestablished patient who reports to our office with onset of substernal chest pain that is constant in nature since Thursday.  He brings several a AVS to the office from [facility] emergency department.  It appears that he was perhaps evaluated in the ED for ACS and was told that he had pancreatitis.  However, no antibiotics and AVS.  He was discharged with tramadol.  Patient notes that chest pain is worsened with activity and with food.  He has been eating a bland diet which has helped some.  He is tolerating fluids without difficulty.  He notes difficulty taking a deep breath in.  He reports associated nausea and sweating.  Denies any vomiting, hematochezia, melena.  No fevers, chills.  No hemoptysis.  No lower extremity edema.  No neurologic changes.  He does note some left-sided low back pain.  Past medical history is significant for hypertension, diabetes, hyperlipidemia, prostate cancer.  He also is an every day smoker.  No alcohol or drug use.  Unfortunately, emergency department notes and evaluation are not available.   ROS: Per HPI  No Known Allergies Past Medical History:  Diagnosis Date  . Arthritis   . Cancer [facility])    prostate 2017  . Depression   . Diabetes mellitus without complication ([place])   . Headache   . High cholesterol   . Hypertension   . Inguinal hernia    rt  . Neck pain   . Numbness and tingling in hands   . Urinary frequency     Current Outpatient Medications:  .  atorvastatin (LIPITOR) 40 MG tablet, Take 40 mg by mouth daily., Disp: , Rfl:  .  glipiZIDE (GLUCOTROL) 10 MG tablet, Take 10 mg by mouth daily before breakfast. 1 tablet in morning and 1/2 tablet in the evening, Disp: , Rfl:  .  glyBURIDE-metformin (GLUCOVANCE)  5-500 MG tablet, Take 2 tablets by mouth in the morning and 1 in the evening, Disp: , Rfl: 1 .  HYDROcodone-acetaminophen (NORCO/VICODIN) 5-325 MG tablet, Take 1-2 tablets by mouth every 6 (six) hours as needed., Disp: 20 tablet, Rfl: 0 .  lisinopril (PRINIVIL,ZESTRIL) 5 MG tablet, Take 5 mg by mouth daily., Disp: , Rfl:  .  naproxen (NAPROSYN) 500 MG tablet, Take 500 mg by mouth 2 (two) times daily with a meal., Disp: , Rfl:  .  albuterol (PROVENTIL HFA;VENTOLIN HFA) 108 (90 Base) MCG/ACT inhaler, Inhale 2 puffs into the lungs every 6 (six) hours., Disp: , Rfl: 0 .  FLOVENT HFA 110 MCG/ACT inhaler, Inhale 2 puffs into the lungs 2 (two) times daily., Disp: , Rfl: 0 .  metFORMIN (GLUCOPHAGE) 1000 MG tablet, Take 1,000 mg by mouth 2 (two) times daily with a meal. 1 tablet in morning and 1/2 tablet in evening, Disp: , Rfl:  .  predniSONE (DELTASONE) 10 MG tablet, Take 4 tablets (40 mg total) by mouth daily., Disp: 20 tablet, Rfl: 0   Social History   Socioeconomic History  . Marital status: Married    Spouse name: Not on file  . Number of children: Not on file  . Years of education: Not on file  . Highest education level: Not on file  Occupational History  . Not on file  Social Needs  .  Financial resource strain: Not on file  . Food insecurity:    Worry: Not on file    Inability: Not on file  . Transportation needs:    Medical: Not on file    Non-medical: Not on file  Tobacco Use  . Smoking status: Current Every Day Smoker    Packs/day: 0.25    Years: 40.00    Pack years: 10.00    Types: Cigarettes  . Smokeless tobacco: Never Used  Substance and Sexual Activity  . Alcohol use: Yes    Comment: occassionally  . Drug use: No  . Sexual activity: Not on file  Lifestyle  . Physical activity:    Days per week: Not on file    Minutes per session: Not on file  . Stress: Not on file  Relationships  . Social connections:    Talks on phone: Not on file    Gets together: Not on file     Attends religious service: Not on file    Active member of club or organization: Not on file    Attends meetings of clubs or organizations: Not on file    Relationship status: Not on file  . Intimate partner violence:    Fear of current or ex partner: Not on file    Emotionally abused: Not on file    Physically abused: Not on file    Forced sexual activity: Not on file  Other Topics Concern  . Not on file  Social History Narrative  . Not on file   No family history on file.  Objective: Office vital signs reviewed. BP 115/85   Pulse (!) 102   Ht 5\' 3"  (1.6 m)   Wt 150 lb (68 kg)   SpO2 98%   BMI 26.57 kg/m   Physical Examination:  General: Awake, alert, nontoxic, No acute distress HEENT: Normal    Neck: No masses palpated. No lymphadenopathy; no JVD    Eyes: PERRLA, extraocular membranes intact, sclera white    Throat: moist mucus membranes, no erythema.  Airway is patent Cardio: slightly increased heart rate and rhythm, S1S2 heard, no murmurs appreciated Pulm: clear to auscultation bilaterally, no wheezes, rhonchi or rales; normal work of breathing on room air GI: soft, +epigastric TTP. Negative Murphy's. Negative McBurney's. Non-distended, bowel sounds present x4, no hepatomegaly, no splenomegaly, no masses Extremities: warm, well perfused, No edema, cyanosis or clubbing; +2 pulses bilaterally MSK: gait not assessed.  Normal muscle tone. Skin: dry; intact; no rashes or lesions Neuro: AOx3. Follows commands.  Assessment/ Plan: 67 y.o. male   1. Chest pain, unspecified type Atypical.  He is tachycardic but he has normal blood pressure and oxygenation on room air.  His physical exam is unremarkable except for epigastric tenderness to palpation.  It appears that he may have been evaluated for pancreatitis in the emergency department at [facility].  Difficult to discern whether or not he was evaluated for ACS.  EKG did not demonstrate any ST abnormalities today.  He had  slight depression of T waves in V1.  Given risk factors (HTN, DM2, HLD, Tobacco use), I have recommended that he be evaluated in the [facility] emergency department for ACS.  We will plan to address other chronic conditions after he has been evaluated for more emergent symptoms.  We will work on obtaining records from [facility] emergency department. - EKG 12-Lead   Orders Placed This Encounter  Procedures  . EKG 12-Lead    Janora Norlander, DO [facility] [phone]

## 2017-12-22 NOTE — ED Notes (Signed)
[employer] [place]

## 2017-12-22 NOTE — ED Notes (Signed)
Pt stated he felt his sugar was low, CBG machine said 32, RN notified, pt given orange juice, graham crackers and peanut butter

## 2017-12-22 NOTE — ED Provider Notes (Signed)
[facility] EMERGENCY DEPARTMENT Provider Note   CSN: [id number] Arrival date & time: 12/22/17  1459     History   Chief Complaint Chief Complaint  Patient presents with  . Abdominal Pain    HPI Chase [name] is a 67 y.o. male.  Pt presents to the ED today with abdominal pain.  The pt has had abdominal pain for the past 2 weeks.  He has been to [facility] ED who did a work up including a CT abd/pelvis.  Work up showed constipation as well as probable progression of his prostate cancer.  The pt went to his pcp today c/o cp so he was sent here for eval.  The pt said he's been nauseous and has not wanted to eat.  Due to language barrier, a video interpreter was present during the history-taking and subsequent discussion (and for part of the physical exam) with this patient.     Past Medical History:  Diagnosis Date  . Arthritis   . Cancer [facility])    prostate 2017  . Depression   . Diabetes mellitus without complication ([place])   . Headache   . High cholesterol   . Hypertension   . Inguinal hernia    rt  . Neck pain   . Numbness and tingling in hands   . Urinary frequency     Patient Active Problem List   Diagnosis Date Noted  . Osteoarthritis of spine with radiculopathy, lumbar region 08/01/2015  . HNP (herniated nucleus pulposus) with myelopathy, cervical 04/04/2015    Past Surgical History:  Procedure Laterality Date  . ANTERIOR CERVICAL DECOMP/DISCECTOMY FUSION N/A 04/04/2015   Procedure: ANTERIOR CERVICAL DECOMPRESSION/DISCECTOMY FUSION PLATING BONEGRAFT CERVICAL FOUR-FIVE CERVICAL FIVE-SIX;  Surgeon: Ashok Pall, MD;  Location: [place] [facility];  Service: Neurosurgery;  Laterality: N/A;  . SHOULDER ARTHROSCOPY Right   . UPPER EXTREMITY VENOGRAPHY N/A 07/22/2016   Procedure: Upper Extremity Venography - bilateral arm;  Surgeon: Serafina Mitchell, MD;  Location: [place] CV LAB;  Service: Cardiovascular;  Laterality: N/A;        Home Medications     Prior to Admission medications   Medication Sig Start Date End Date Taking? Authorizing Provider  albuterol (PROVENTIL HFA;VENTOLIN HFA) 108 (90 Base) MCG/ACT inhaler Inhale 2 puffs into the lungs every 6 (six) hours. 12/19/17  Yes [provider]  atorvastatin (LIPITOR) 40 MG tablet Take 40 mg by mouth every evening.    Yes [provider]  FLOVENT HFA 110 MCG/ACT inhaler Inhale 2 puffs into the lungs 2 (two) times daily. 12/19/17  Yes [provider]  glyBURIDE-metformin (GLUCOVANCE) 5-500 MG tablet Take 1-2 tablets by mouth See admin instructions. Take 2 tablets by mouth in the morning and 1 in the evening. 09/18/17  Yes [provider]  lisinopril (PRINIVIL,ZESTRIL) 5 MG tablet Take 5 mg by mouth daily.   Yes [provider]  naproxen (NAPROSYN) 500 MG tablet Take 500 mg by mouth 2 (two) times daily with a meal.   Yes [provider]  tamsulosin (FLOMAX) 0.4 MG CAPS capsule Take 0.4 mg by mouth every evening.   Yes [provider]  traMADol (ULTRAM) 50 MG tablet Take 50-100 mg by mouth every 8 (eight) hours as needed for moderate pain.   Yes [provider]  docusate sodium (COLACE) 100 MG capsule Take 1 capsule (100 mg total) by mouth every 12 (twelve) hours. 12/22/17   Isla Pence, MD  polyethylene glycol [facility]) packet Take 17 g  by mouth daily. 12/22/17   Isla Pence, MD    Family History No family history on file.  Social History Social History   Tobacco Use  . Smoking status: Current Every Day Smoker    Packs/day: 0.25    Years: 40.00    Pack years: 10.00    Types: Cigarettes  . Smokeless tobacco: Never Used  Substance Use Topics  . Alcohol use: Yes    Comment: occassionally  . Drug use: No     Allergies   Patient has no known allergies.   Review of Systems Review of Systems  Cardiovascular: Positive for chest pain.  Gastrointestinal: Positive for abdominal pain and constipation.  All  other systems reviewed and are negative.    Physical Exam Updated Vital Signs BP 119/76   Pulse 76   Temp 97.9 F (36.6 C) (Oral)   Resp 17   Wt 65.9 kg (145 lb 6 oz)   SpO2 100%   BMI 25.75 kg/m   Physical Exam  Constitutional: He is oriented to person, place, and time. He appears well-developed and well-nourished.  HENT:  Head: Normocephalic and atraumatic.  Mouth/Throat: Oropharynx is clear and moist.  Eyes: Pupils are equal, round, and reactive to light. EOM are normal.  Cardiovascular: Normal rate, regular rhythm, normal heart sounds and intact distal pulses.  Pulmonary/Chest: Effort normal and breath sounds normal.  Abdominal: Normal appearance and bowel sounds are normal. There is tenderness in the epigastric area.  Neurological: He is alert and oriented to person, place, and time.  Skin: Skin is warm. Capillary refill takes less than 2 seconds.  Psychiatric: He has a normal mood and affect. His behavior is normal.  Nursing note and vitals reviewed.    ED Treatments / Results  Labs (all labs ordered are listed, but only abnormal results are displayed) Labs Reviewed  CBC WITH DIFFERENTIAL/PLATELET - Abnormal; Notable for the following components:      Result Value   WBC 12.2 (*)    Neutro Abs 9.4 (*)    All other components within normal limits  COMPREHENSIVE METABOLIC PANEL - Abnormal; Notable for the following components:   Glucose, Bld 52 (*)    All other components within normal limits  URINALYSIS, ROUTINE W REFLEX MICROSCOPIC - Abnormal; Notable for the following components:   Hgb urine dipstick SMALL (*)    All other components within normal limits  CBG MONITORING, ED - Abnormal; Notable for the following components:   Glucose-Capillary 32 (*)    All other components within normal limits  CBG MONITORING, ED - Abnormal; Notable for the following components:   Glucose-Capillary 212 (*)    All other components within normal limits  LIPASE, BLOOD    TROPONIN I    EKG EKG Interpretation  Date/Time:  Tuesday December 22 2017 15:04:35 EDT Ventricular Rate:  75 PR Interval:    QRS Duration: 94 QT Interval:  383 QTC Calculation: 428 R Axis:   97 Text Interpretation:  Sinus rhythm Right axis deviation No significant change since last tracing Confirmed by Isla Pence [phone]) on 12/22/2017 3:23:33 PM   Radiology Dg Abd Acute W/chest  Result Date: 12/22/2017 CLINICAL DATA:  Epigastric pain for several days EXAM: DG ABDOMEN ACUTE W/ 1V CHEST COMPARISON:  7/20/9 FINDINGS: Cardiac shadows within normal limits. The lungs are well aerated bilaterally. No focal infiltrate or sizable effusion is seen. Scattered large and small bowel gas is noted. Contrast material is noted within the colon likely related to recent CT  examination from 12/18/2017. No obstructive changes are seen. No free air is noted. Postsurgical changes in the lumbar spine are seen. IMPRESSION: No acute abnormality noted. Electronically Signed   By: Inez Catalina M.D.   On: 12/22/2017 16:08    Procedures Procedures (including critical care time)  Medications Ordered in ED Medications  ondansetron (ZOFRAN) injection 4 mg (4 mg Intravenous Given 12/22/17 1535)  0.9 %  sodium chloride infusion (0 mLs Intravenous Stopped 12/22/17 1824)  sodium phosphate (FLEET) 7-19 GM/118ML enema 1 enema (1 enema Rectal Given 12/22/17 1812)  dextrose 50 % solution (50 mLs  Given 12/22/17 1717)     Initial Impression / Assessment and Plan / ED Course  I have reviewed the triage vital signs and the nursing notes.  Pertinent labs & imaging results that were available during my care of the patient were reviewed by me and considered in my medical decision making (see chart for details).    Pt is feeling much better after an enema.  He has not had any sob while here.  CXR and cardiac work up negative.  Pt encouraged to return if worse and to f/u with pcp.  Questions answered via video  interpreter.  Final Clinical Impressions(s) / ED Diagnoses   Final diagnoses:  Constipation, unspecified constipation type  Dyspnea, unspecified type    ED Discharge Orders        Ordered    polyethylene glycol (MIRALAX) packet  Daily     12/22/17 1850    docusate sodium (COLACE) 100 MG capsule  Every 12 hours     12/22/17 1850       Isla Pence, MD 12/22/17 1857

## 2017-12-22 NOTE — ED Triage Notes (Signed)
Sent from [place] for evaluation of epigastric pain,

## 2017-12-22 NOTE — ED Notes (Signed)
Have called [facility] to bring fleet enema. None in clean supply room

## 2017-12-22 NOTE — ED Notes (Signed)
Pt was able to poop a small bit. States he is feeling better and the pain is better.

## 2018-01-04 DIAGNOSIS — Z6825 Body mass index (BMI) 25.0-25.9, adult: Secondary | ICD-10-CM | POA: Diagnosis not present

## 2018-01-04 DIAGNOSIS — M4316 Spondylolisthesis, lumbar region: Secondary | ICD-10-CM | POA: Diagnosis not present

## 2018-01-04 DIAGNOSIS — M5126 Other intervertebral disc displacement, lumbar region: Secondary | ICD-10-CM | POA: Diagnosis not present

## 2018-01-07 IMAGING — MR MR LUMBAR SPINE W/O CM
3 of 5 series · 9 of 48 positions shown · non-contrast
Comparison: Prior CT from earlier the same day as well as previous
MRI from 03/01/2015.

CLINICAL DATA: Initial evaluation for acute back pain status post
recent fall 3 days ago.

EXAM:
MRI LUMBAR SPINE WITHOUT CONTRAST
TECHNIQUE: Multiplanar, multisequence MR imaging of the lumbar spine was
performed. No intravenous contrast was administered.

[Series 3: T2 · sagittal · 4.0mm · 0.36mm/px · 3 of 13 slices shown (1 of 2)]
[im 1/13]
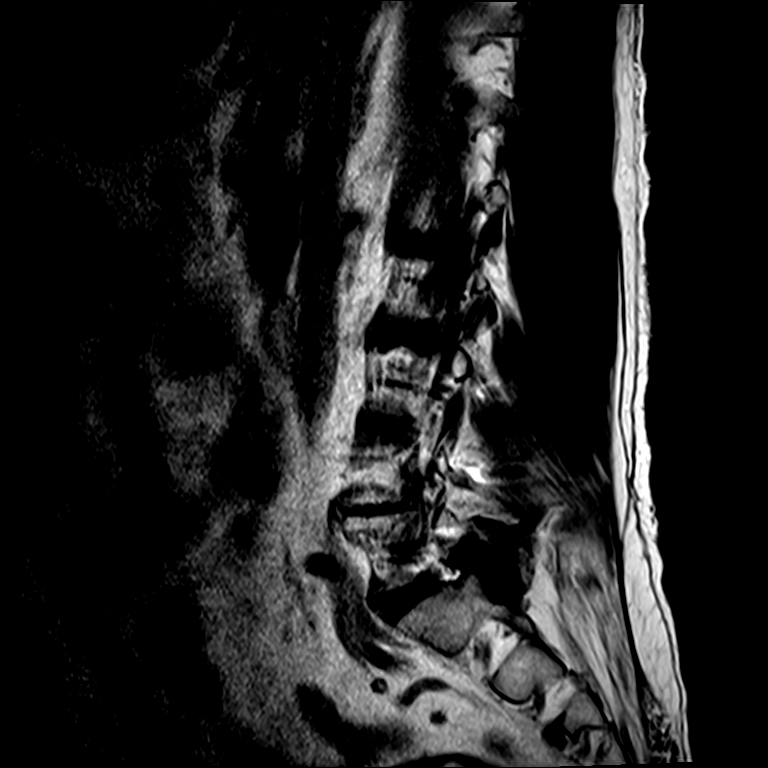
[im 7/13]
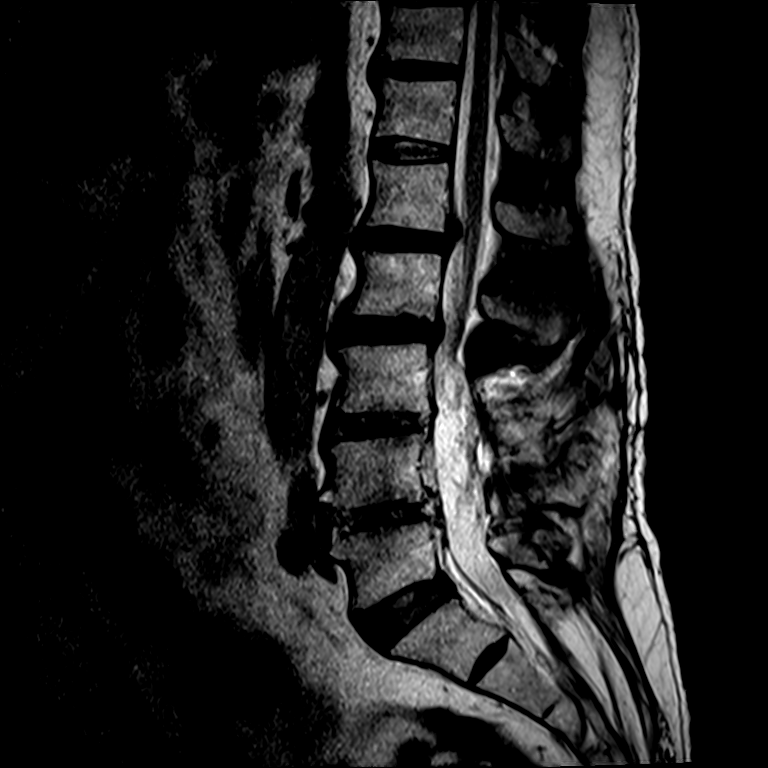
[im 13/13]
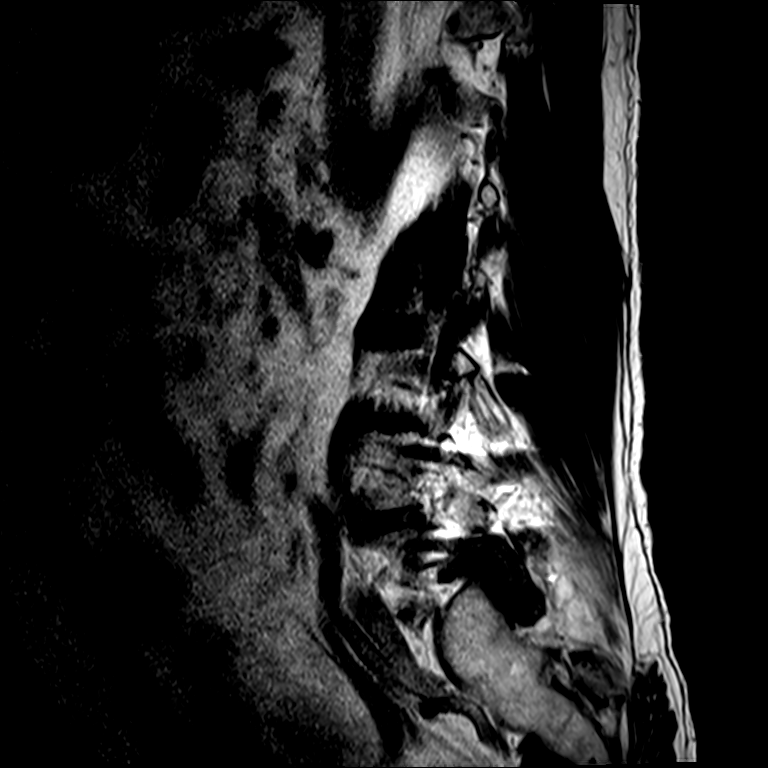

[Series 4: T1 · sagittal · 4.0mm · 0.36mm/px · 3 of 13 slices shown]
[im 1/13]
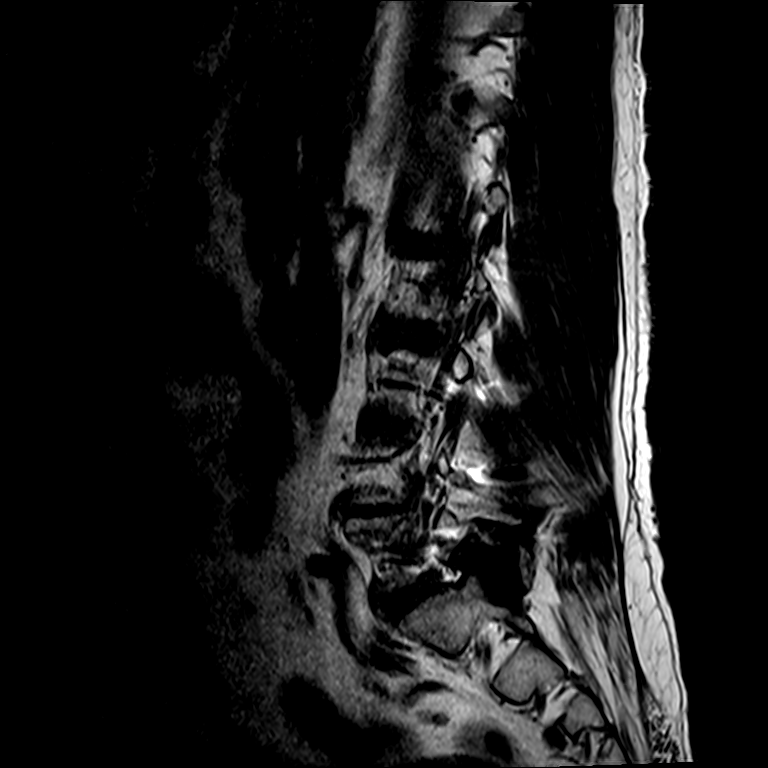
[im 7/13]
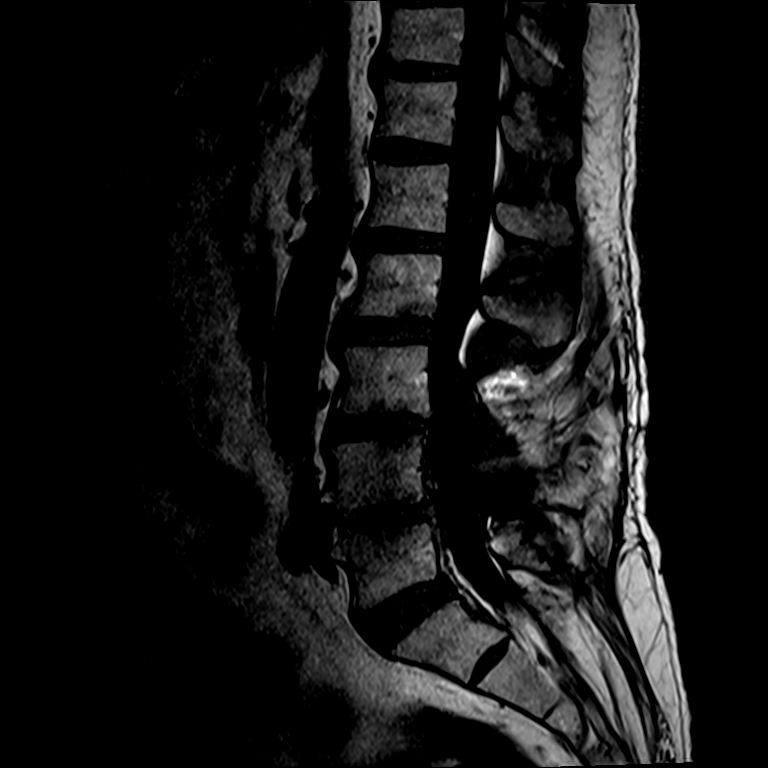
[im 13/13]
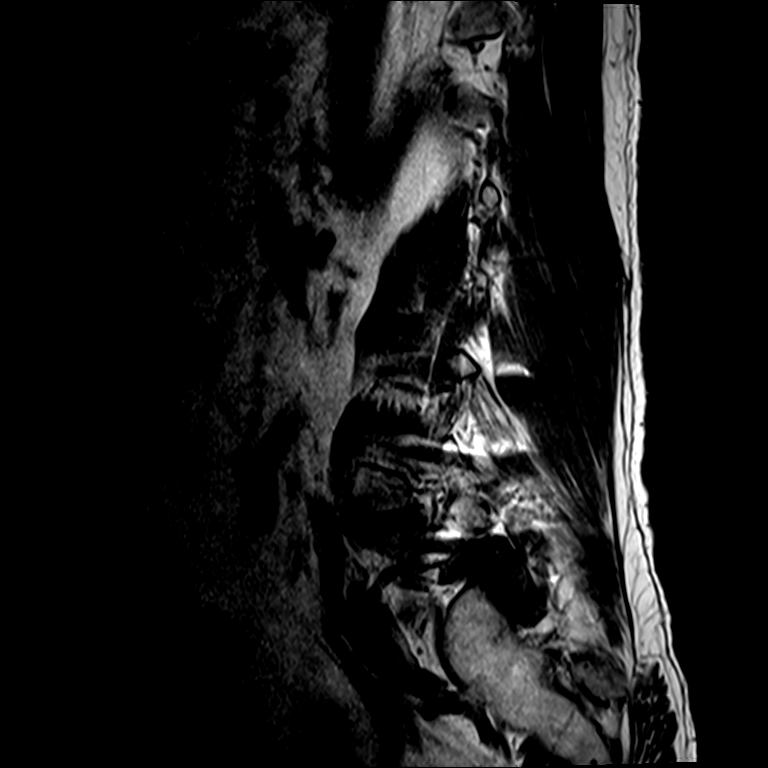

[Series 6: T2 · axial · 4.0mm · 0.23mm/px · z∈[-157,-19]mm · 3 of 40 slices shown (2 of 2)]
[im 6/40]
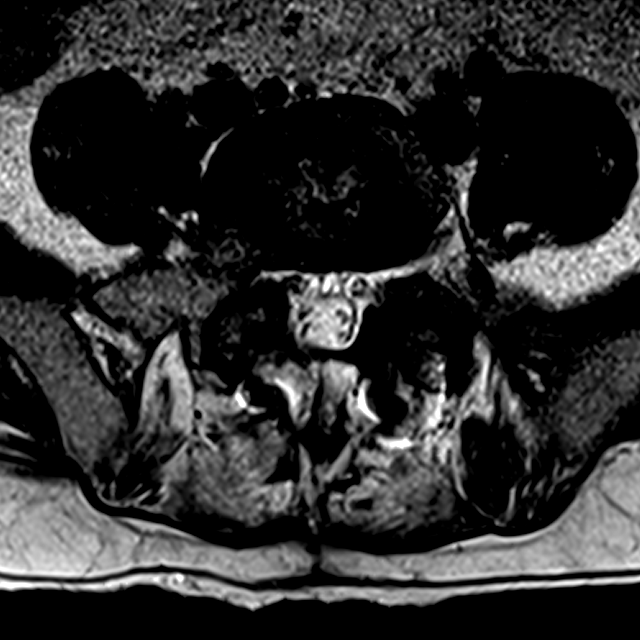
[im 21/40]
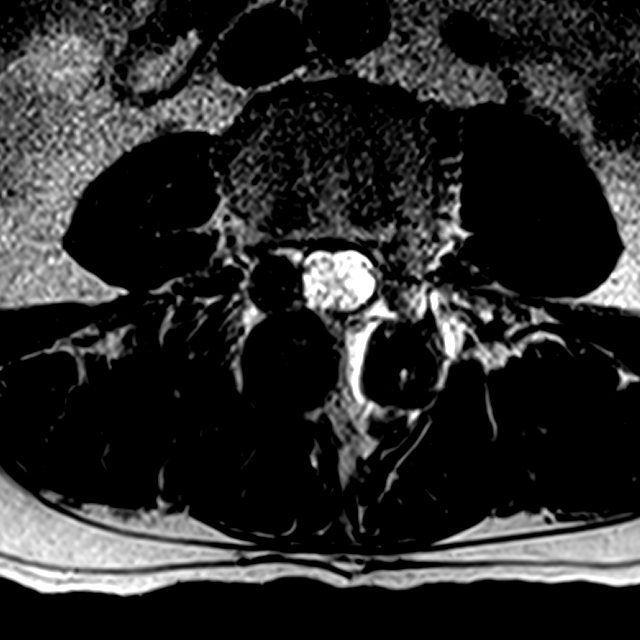
[im 34/40]
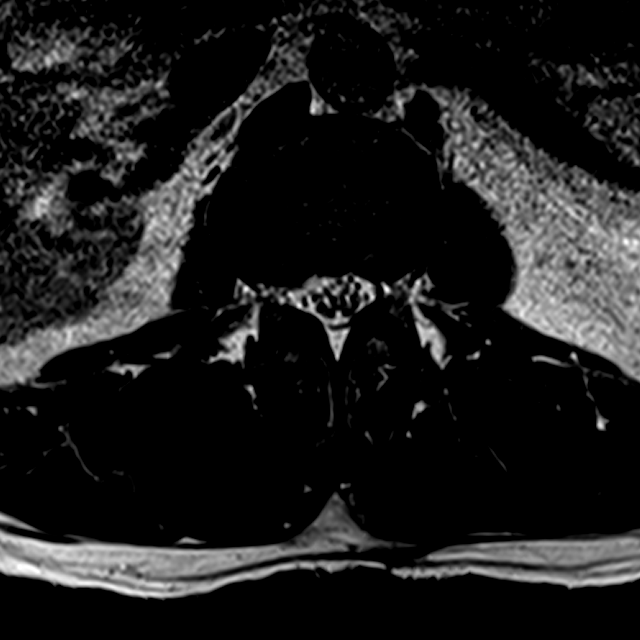

[9 of 48 positions shown; findings below may reference images not displayed]

FINDINGS: Segmentation: Normal segmentation. Lowest well-formed disc labeled
the L5-S1 level.

Alignment: Mild straightening of the normal lumbar lordosis. Trace
retrolisthesis of L2 on L3.

Vertebrae: Vertebral body heights maintained. No evidence for acute,
subacute, or chronic fracture. Sequelae of posterior decompression
with fusion present at L3 through L5. Susceptibility artifact from
transpedicular screws limits evaluation at these levels. Bone marrow
signal intensity within normal limits. No discrete or worrisome
osseous lesions.

Conus medullaris: Extends to the L2 level and appears normal.

Paraspinal and other soft tissues: Edema present within the lower
paraspinous musculature bilaterally (series 5, image 12, 2).
Paraspinous soft tissues otherwise within normal limits.
Subcentimeter T2 hyperintense cyst noted within the right kidney.
Visualized visceral structures otherwise unremarkable.

Disc levels:

L1-2: Chronic right paracentral disc extrusion with superior
migration, relatively similar in appearance as compared to previous
MRI. Unchanged mild spinal stenosis. No significant foraminal
encroachment.

L2-3: Trace retrolisthesis, new relative to previous MRI from 7432.
Progressive degenerative disc bulging with disc desiccation and
intervertebral disc space narrowing. Disc bulging a centric to the
right with broad right extraforaminal component. Progressive
moderate facet and ligamentum flavum hypertrophy. Resultant moderate
canal and bilateral subarticular stenosis, worsened from previous.
Thecal sac measures 7-8 mm in AP diameter. Moderate bilateral neural
foraminal narrowing, also progressed from previous.

L3-4: Status post posterior and interbody fusion with posterior
decompression. No residual canal or foraminal stenosis.

L4-5: Status post posterior and interbody fusion with posterior
decompression. No residual canal stenosis. Residual endplate
spurring encroaches upon the right neural foramen with persistent
moderate right L4 foraminal stenosis, improved relative to 7432. No
significant left foraminal narrowing.

L5-S1: No significant disc bulge. Advanced bilateral facet
arthropathy with bulky overgrowth. No significant canal or
subarticular stenosis. Mild to moderate bilateral L5 foraminal
narrowing, slightly greater on the left.
IMPRESSION: 1. Soft tissue edema within the bilateral lower paraspinous
musculature, which may reflect muscular strain and/or injury.
2. No other acute abnormality within the lumbar spine. No other
traumatic injury status post recent fall.
3. Adjacent segment disease at L2-3 with moderate canal and
bilateral foraminal stenosis, progressed relative to 7432.
4. L3-4 and L4-5 PLIF without residual canal stenosis. Moderate
right L4 foraminal stenosis related to residual endplate spurring.
5. Advanced facet arthropathy at L5-S1 with mild to moderate
bilateral foraminal stenosis, left greater than right.
6. Chronic right paracentral disc extrusion at L1-2, unchanged.

## 2018-01-20 ENCOUNTER — Other Ambulatory Visit: Payer: Self-pay | Admitting: Neurosurgery

## 2018-02-11 NOTE — Pre-Procedure Instructions (Signed)
Chase [name]  02/11/2018      [facility] [address], [place] [address] [place] [address] Phone: [phone] Fax: [phone]    Your procedure is scheduled on Wednesday September 18.  Report to [facility] Admitting at 8:15 A.M.  Call this number if you have problems the morning of surgery:  [phone]   Remember:  Do not eat or drink after midnight.    Take these medicines the morning of surgery with A SIP OF WATER:   Albuterol if needed (please bring inhaler to hospital with you) Flovent inhaler Tramadol (ultram) if needed  DO NOT TAKE glyburide-metformin (Glucovance) the night before surgery, or morning of surgery.   7 days prior to surgery STOP taking any Aspirin(unless otherwise instructed by your surgeon), Aleve, Naproxen, Ibuprofen, Motrin, Advil, Goody's, BC's, all herbal medications, fish oil, and all vitamins     How to Manage Your Diabetes Before and After Surgery  Why is it important to control my blood sugar before and after surgery? . Improving blood sugar levels before and after surgery helps healing and can limit problems. . A way of improving blood sugar control is eating a healthy diet by: o  Eating less sugar and carbohydrates o  Increasing activity/exercise o  Talking with your doctor about reaching your blood sugar goals . High blood sugars (greater than 180 mg/dL) can raise your risk of infections and slow your recovery, so you will need to focus on controlling your diabetes during the weeks before surgery. . Make sure that the doctor who takes care of your diabetes knows about your planned surgery including the date and location.  How do I manage my blood sugar before surgery? . Check your blood sugar at least 4 times a day, starting 2 days before surgery, to make sure that the level is not too high or low. o Check your blood sugar the morning of your surgery when you wake up and every 2  hours until you get to the Short Stay unit. . If your blood sugar is less than 70 mg/dL, you will need to treat for low blood sugar: o Do not take insulin. o Treat a low blood sugar (less than 70 mg/dL) with  cup of clear juice (cranberry or apple), 4 glucose tablets, OR glucose gel. Recheck blood sugar in 15 minutes after treatment (to make sure it is greater than 70 mg/dL). If your blood sugar is not greater than 70 mg/dL on recheck, call [phone] o  for further instructions. . Report your blood sugar to the short stay nurse when you get to Short Stay.  . If you are admitted to the hospital after surgery: o Your blood sugar will be checked by the staff and you will probably be given insulin after surgery (instead of oral diabetes medicines) to make sure you have good blood sugar levels. o The goal for blood sugar control after surgery is 80-180 mg/dL.                Do not wear jewelry, make-up or nail polish.  Do not wear lotions, powders, or perfumes, or deodorant.  Do not shave 48 hours prior to surgery.  Men may shave face and neck.  Do not bring valuables to the hospital.  [facility] is not responsible for any belongings or valuables.  Contacts, dentures or bridgework may not be worn into surgery.  Leave your suitcase in the  car.  After surgery it may be brought to your room.  For patients admitted to the hospital, discharge time will be determined by your treatment team.  Patients discharged the day of surgery will not be allowed to drive home.   Special instructions:    [place]- Preparing For Surgery  Before surgery, you can play an important role. Because skin is not sterile, your skin needs to be as free of germs as possible. You can reduce the number of germs on your skin by washing with CHG (chlorahexidine gluconate) Soap before surgery.  CHG is an antiseptic cleaner which kills germs and bonds with the skin to continue killing germs even after washing.      Oral Hygiene is also important to reduce your risk of infection.  Remember - BRUSH YOUR TEETH THE MORNING OF SURGERY WITH YOUR REGULAR TOOTHPASTE  Please do not use if you have an allergy to CHG or antibacterial soaps. If your skin becomes reddened/irritated stop using the CHG.  Do not shave (including legs and underarms) for at least 48 hours prior to first CHG shower. It is OK to shave your face.  Please follow these instructions carefully.   1. Shower the NIGHT BEFORE SURGERY and the MORNING OF SURGERY with CHG.   2. If you chose to wash your hair, wash your hair first as usual with your normal shampoo.  3. After you shampoo, rinse your hair and body thoroughly to remove the shampoo.  4. Use CHG as you would any other liquid soap. You can apply CHG directly to the skin and wash gently with a scrungie or a clean washcloth.   5. Apply the CHG Soap to your body ONLY FROM THE NECK DOWN.  Do not use on open wounds or open sores. Avoid contact with your eyes, ears, mouth and genitals (private parts). Wash Face and genitals (private parts)  with your normal soap.  6. Wash thoroughly, paying special attention to the area where your surgery will be performed.  7. Thoroughly rinse your body with warm water from the neck down.  8. DO NOT shower/wash with your normal soap after using and rinsing off the CHG Soap.  9. Pat yourself dry with a CLEAN TOWEL.  10. Wear CLEAN PAJAMAS to bed the night before surgery, wear comfortable clothes the morning of surgery  11. Place CLEAN SHEETS on your bed the night of your first shower and DO NOT SLEEP WITH PETS.    Day of Surgery:  Do not apply any deodorants/lotions.  Please wear clean clothes to the hospital/surgery center.   Remember to brush your teeth WITH YOUR REGULAR TOOTHPASTE.    Please read over the following fact sheets that you were given. Coughing and Deep Breathing, MRSA Information and Surgical Site Infection  Prevention

## 2018-02-12 ENCOUNTER — Other Ambulatory Visit: Payer: Self-pay

## 2018-02-12 ENCOUNTER — Encounter (HOSPITAL_COMMUNITY)
Admission: RE | Admit: 2018-02-12 | Discharge: 2018-02-12 | Disposition: A | Discharge status: Home / Self Care | Payer: Medicare Other | Source: Ambulatory Visit | Attending: Neurosurgery | Admitting: Neurosurgery

## 2018-02-12 ENCOUNTER — Telehealth: Payer: Self-pay | Admitting: Family Medicine

## 2018-02-12 ENCOUNTER — Encounter (HOSPITAL_COMMUNITY): Payer: Self-pay

## 2018-02-12 DIAGNOSIS — Z01812 Encounter for preprocedural laboratory examination: Secondary | ICD-10-CM | POA: Insufficient documentation

## 2018-02-12 DIAGNOSIS — E119 Type 2 diabetes mellitus without complications: Secondary | ICD-10-CM | POA: Insufficient documentation

## 2018-02-12 LAB — HEMOGLOBIN A1C
Hgb A1c MFr Bld: 6.9 % — ABNORMAL HIGH (ref 4.8–5.6)
Mean Plasma Glucose: 151.33 mg/dL

## 2018-02-12 LAB — CBC
HCT: 46.9 % (ref 39.0–52.0)
Hemoglobin: 15.3 g/dL (ref 13.0–17.0)
MCH: 30.2 pg (ref 26.0–34.0)
MCHC: 32.6 g/dL (ref 30.0–36.0)
MCV: 92.7 fL (ref 78.0–100.0)
Platelets: 207 10*3/uL (ref 150–400)
RBC: 5.06 MIL/uL (ref 4.22–5.81)
RDW: 13.2 % (ref 11.5–15.5)
WBC: 7.6 10*3/uL (ref 4.0–10.5)

## 2018-02-12 LAB — TYPE AND SCREEN
ABO/RH(D): A POS
Antibody Screen: NEGATIVE

## 2018-02-12 LAB — SURGICAL PCR SCREEN
MRSA, PCR: NEGATIVE
Staphylococcus aureus: NEGATIVE

## 2018-02-12 LAB — BASIC METABOLIC PANEL
Anion gap: 13 (ref 5–15)
BUN: 12 mg/dL (ref 8–23)
CO2: 22 mmol/L (ref 22–32)
Calcium: 9.4 mg/dL (ref 8.9–10.3)
Chloride: 104 mmol/L (ref 98–111)
Creatinine, Ser: 0.65 mg/dL (ref 0.61–1.24)
GFR calc Af Amer: >60 mL/min (ref >60)
GFR calc non Af Amer: >60 mL/min (ref >60)
Glucose, Bld: 188 mg/dL — ABNORMAL HIGH (ref 70–99)
Potassium: 3.7 mmol/L (ref 3.5–5.1)
Sodium: 139 mmol/L (ref 135–145)

## 2018-02-12 LAB — GLUCOSE, CAPILLARY: Glucose-Capillary: 228 mg/dL — ABNORMAL HIGH (ref 70–99)

## 2018-02-12 NOTE — Progress Notes (Signed)
Instrucciones Para Antes de la Ciruga   Su ciruga est programada para-(your procedure is scheduled on) 02/17/18   Entre: [facility] Main Entrance, Admitting office at 5:30 AM  Recuerde: (Remember)   No coma alimentos ni tome lquidos, incluyendo agua, despus de la medianoche (Do not eat food or drink liquids including water after midnight)  [employer] en la manaa de la ciruga con un SORBITO de agua (take these meds the morning of surgery with a SIP of water)_tramadol if needed, albuterol inhaler, flovent inhaler  No tome glyburide-metformin (Glucovance) en la manaa de la ciruga    Puede cepillarse los dientes en la maana de la [place]. (you may brush your teeth the morning of surgery)   No use joyas, maquillaje de ojos, lpiz labial, crema para el cuerpo o esmalte de uas oscuro.  (Do not wear jewelry, eye makeup, lipstick, body lotion, or dark fingernail polish)   No puede usar desodorante. (you may not wear deodorant)   Si va a ser ingresado despus de la ciruga, deje la maleta en el carro hasta que se le haya asignado una habitacin. (If you are to be admitted after surgery, leave suitcase in car until your room has been assigned.)   A los pacientes que se les d de alta el mismo da no se les permitir manejar a casa.  (Patients discharged on the day of surgery will not be allowed to drive home)   Use ropa suelta y cmoda de regreso a casa. (wear loose comfortable clothes for ride home)

## 2018-02-12 NOTE — Telephone Encounter (Signed)
Per med history and med rec - pt should have d/c glipizide 5 mg and 10 mg on 12/22/17 for "change in therapy" He had a empty bottle of this one.

## 2018-02-12 NOTE — Progress Notes (Addendum)
PCP - pt was being seen at a clinic in [facility], possibly the department of public health. Pt was to see a new doctor but was unable to make this appointment and does not know then name  Cardiologist - denies  Chest x-ray - denies EKG - 12/22/17  Pt states he has no way to check blood sugar at home. Pt has 2 diabetic pills in his bag, but is unsure of what he is supposed to be taking and states he was told that I would tell him which medications he should be taking. I spoke with triage nurse at [place] of public health. Glipizide was prescribed for patient at the health department, but due to the patient having Medicare, he can no longer be seen at clinic.  Pt was referred to [place] by clinic. Pt given number for [place] and instructed to call today regarding his medications. Pt instructed to not take his Diabetic pills the day of surgery and not to take his Evening dose of diabetic pills the night before surgery. Pt is to call short stay if any medications are changed prior to surgery.    I left a message with Dr. Lacy Duverney surgery scheduler Lexine Baton regarding pt not having PCP, unable to check CBG and having elevated blood sugar at PAT. Unsure whether patient had needed any type of medical clearance. Pt stated he will call for advice on his medications as well as potentially scheduling an appointment prior to surgery if needed.   Patient denies shortness of breath, fever, cough and chest pain at PAT appointment  Patient verbalized understanding of instructions that were given to them at the PAT appointment. Patient was also instructed that they will need to review over the PAT instructions again at home before surgery. Pt instructed to have his son who speaks English to review the instructions as well.   Interpreter requested for Day of surgery.

## 2018-02-15 NOTE — Progress Notes (Signed)
Pt daughter called back stating that her father called his PCP to see if he needed an appointment prior to surgery as well to clarify diabetic pills. Per MD pt doesn't need to come in for an appointment, but he is to stop taking Glipizide and only take Glucovance. Daughter given instructions to have her father take Glucovance the morning before surgery, but not the night before or morning of surgery.

## 2018-02-17 ENCOUNTER — Encounter (HOSPITAL_COMMUNITY): Payer: Self-pay

## 2018-02-17 ENCOUNTER — Inpatient Hospital Stay (HOSPITAL_COMMUNITY): Payer: Medicare Other | Admitting: Anesthesiology

## 2018-02-17 ENCOUNTER — Inpatient Hospital Stay (HOSPITAL_COMMUNITY)
Admission: RE | Admit: 2018-02-17 | Discharge: 2018-02-19 | DRG: 460 | Disposition: A | Discharge status: Home Health | Payer: Medicare Other | Attending: Neurosurgery | Admitting: Neurosurgery

## 2018-02-17 ENCOUNTER — Inpatient Hospital Stay (HOSPITAL_COMMUNITY): Payer: Medicare Other

## 2018-02-17 ENCOUNTER — Inpatient Hospital Stay (HOSPITAL_COMMUNITY)
Admission: RE | Disposition: A | Discharge status: Home Health | Payer: Self-pay | Source: Home / Self Care | Attending: Neurosurgery

## 2018-02-17 ENCOUNTER — Inpatient Hospital Stay (HOSPITAL_COMMUNITY): Payer: Medicare Other | Admitting: Physician Assistant

## 2018-02-17 DIAGNOSIS — M5126 Other intervertebral disc displacement, lumbar region: Secondary | ICD-10-CM | POA: Diagnosis present

## 2018-02-17 DIAGNOSIS — E7800 Pure hypercholesterolemia, unspecified: Secondary | ICD-10-CM | POA: Diagnosis present

## 2018-02-17 DIAGNOSIS — M4316 Spondylolisthesis, lumbar region: Secondary | ICD-10-CM | POA: Diagnosis present

## 2018-02-17 DIAGNOSIS — F17210 Nicotine dependence, cigarettes, uncomplicated: Secondary | ICD-10-CM | POA: Diagnosis present

## 2018-02-17 DIAGNOSIS — Z7984 Long term (current) use of oral hypoglycemic drugs: Secondary | ICD-10-CM | POA: Diagnosis not present

## 2018-02-17 DIAGNOSIS — M5116 Intervertebral disc disorders with radiculopathy, lumbar region: Secondary | ICD-10-CM | POA: Diagnosis not present

## 2018-02-17 DIAGNOSIS — Z419 Encounter for procedure for purposes other than remedying health state, unspecified: Secondary | ICD-10-CM

## 2018-02-17 DIAGNOSIS — M545 Low back pain: Secondary | ICD-10-CM | POA: Diagnosis not present

## 2018-02-17 DIAGNOSIS — I10 Essential (primary) hypertension: Secondary | ICD-10-CM | POA: Diagnosis present

## 2018-02-17 DIAGNOSIS — M48062 Spinal stenosis, lumbar region with neurogenic claudication: Principal | ICD-10-CM | POA: Diagnosis present

## 2018-02-17 DIAGNOSIS — E119 Type 2 diabetes mellitus without complications: Secondary | ICD-10-CM | POA: Diagnosis present

## 2018-02-17 DIAGNOSIS — M1990 Unspecified osteoarthritis, unspecified site: Secondary | ICD-10-CM | POA: Diagnosis not present

## 2018-02-17 DIAGNOSIS — M4326 Fusion of spine, lumbar region: Secondary | ICD-10-CM | POA: Diagnosis not present

## 2018-02-17 LAB — CREATININE, SERUM
Creatinine, Ser: 0.89 mg/dL (ref 0.61–1.24)
GFR calc Af Amer: >60 mL/min (ref >60)
GFR calc non Af Amer: >60 mL/min (ref >60)

## 2018-02-17 LAB — GLUCOSE, CAPILLARY
Glucose-Capillary: 138 mg/dL — ABNORMAL HIGH (ref 70–99)
Glucose-Capillary: 121 mg/dL — ABNORMAL HIGH (ref 70–99)
Glucose-Capillary: 164 mg/dL — ABNORMAL HIGH (ref 70–99)

## 2018-02-17 LAB — CBC
HCT: 41.5 % (ref 39.0–52.0)
Hemoglobin: 13.4 g/dL (ref 13.0–17.0)
MCH: 30.3 pg (ref 26.0–34.0)
MCHC: 32.3 g/dL (ref 30.0–36.0)
MCV: 93.9 fL (ref 78.0–100.0)
Platelets: 191 10*3/uL (ref 150–400)
RBC: 4.42 MIL/uL (ref 4.22–5.81)
RDW: 13.3 % (ref 11.5–15.5)
WBC: 15.6 10*3/uL — ABNORMAL HIGH (ref 4.0–10.5)

## 2018-02-17 SURGERY — POSTERIOR LUMBAR FUSION 1 LEVEL
Anesthesia: General | Site: Spine Lumbar

## 2018-02-17 MED ORDER — CELECOXIB 200 MG PO CAPS
200.0000 mg | ORAL_CAPSULE | Freq: Two times a day (BID) | ORAL | Status: DC
  Administered 2018-02-17 – 2018-02-19 (×4): 200 mg via ORAL
  Filled 2018-02-17 (×4): qty 1

## 2018-02-17 MED ORDER — THROMBIN (RECOMBINANT) 20000 UNITS EX SOLR
CUTANEOUS | Status: AC
  Filled 2018-02-17: qty 20000

## 2018-02-17 MED ORDER — CEFAZOLIN SODIUM-DEXTROSE 2-4 GM/100ML-% IV SOLN
INTRAVENOUS | Status: AC
  Filled 2018-02-17: qty 100

## 2018-02-17 MED ORDER — ROCURONIUM BROMIDE 50 MG/5ML IV SOSY
PREFILLED_SYRINGE | INTRAVENOUS | Status: AC
  Filled 2018-02-17: qty 5

## 2018-02-17 MED ORDER — TAMSULOSIN HCL 0.4 MG PO CAPS
0.4000 mg | ORAL_CAPSULE | Freq: Every evening | ORAL | Status: DC
  Administered 2018-02-17 – 2018-02-19 (×3): 0.4 mg via ORAL
  Filled 2018-02-17 (×3): qty 1

## 2018-02-17 MED ORDER — ZOLPIDEM TARTRATE 5 MG PO TABS: 5.0000 mg | ORAL_TABLET | Freq: Every evening | ORAL | Status: DC | PRN

## 2018-02-17 MED ORDER — ROCURONIUM BROMIDE 50 MG/5ML IV SOSY
PREFILLED_SYRINGE | INTRAVENOUS | Status: DC | PRN
  Administered 2018-02-17: 50 mg via INTRAVENOUS
  Administered 2018-02-17: 20 mg via INTRAVENOUS

## 2018-02-17 MED ORDER — MORPHINE SULFATE (PF) 2 MG/ML IV SOLN
2.0000 mg | INTRAVENOUS | Status: DC | PRN
  Administered 2018-02-17 (×2): 2 mg via INTRAVENOUS
  Filled 2018-02-17 (×2): qty 1

## 2018-02-17 MED ORDER — HYDROMORPHONE HCL 1 MG/ML IJ SOLN
INTRAMUSCULAR | Status: AC
  Filled 2018-02-17: qty 1

## 2018-02-17 MED ORDER — LIDOCAINE-EPINEPHRINE 0.5 %-1:200000 IJ SOLN
INTRAMUSCULAR | Status: DC | PRN
  Administered 2018-02-17: 30 mL

## 2018-02-17 MED ORDER — HYDROMORPHONE HCL 1 MG/ML IJ SOLN
0.2500 mg | INTRAMUSCULAR | Status: DC | PRN
  Administered 2018-02-17: 0.5 mg via INTRAVENOUS

## 2018-02-17 MED ORDER — HYDROMORPHONE HCL 1 MG/ML IJ SOLN
INTRAMUSCULAR | Status: DC | PRN
  Administered 2018-02-17: 0.5 mg via INTRAVENOUS

## 2018-02-17 MED ORDER — ONDANSETRON HCL 4 MG/2ML IJ SOLN
INTRAMUSCULAR | Status: DC | PRN
  Administered 2018-02-17 (×2): 4 mg via INTRAVENOUS

## 2018-02-17 MED ORDER — BUDESONIDE 0.25 MG/2ML IN SUSP
0.2500 mg | Freq: Two times a day (BID) | RESPIRATORY_TRACT | Status: DC
  Administered 2018-02-17 – 2018-02-19 (×3): 0.25 mg via RESPIRATORY_TRACT
  Filled 2018-02-17 (×4): qty 2

## 2018-02-17 MED ORDER — GLYBURIDE-METFORMIN 5-500 MG PO TABS: 1.0000 | ORAL_TABLET | ORAL | Status: DC

## 2018-02-17 MED ORDER — FENTANYL CITRATE (PF) 250 MCG/5ML IJ SOLN
INTRAMUSCULAR | Status: AC
  Filled 2018-02-17: qty 5

## 2018-02-17 MED ORDER — LIDOCAINE-EPINEPHRINE 0.5 %-1:200000 IJ SOLN
INTRAMUSCULAR | Status: AC
  Filled 2018-02-17: qty 1

## 2018-02-17 MED ORDER — HEPARIN SODIUM (PORCINE) 5000 UNIT/ML IJ SOLN
5000.0000 [IU] | Freq: Three times a day (TID) | INTRAMUSCULAR | Status: DC
  Administered 2018-02-17 – 2018-02-19 (×6): 5000 [IU] via SUBCUTANEOUS
  Filled 2018-02-17 (×6): qty 1

## 2018-02-17 MED ORDER — DIPHENHYDRAMINE HCL 50 MG/ML IJ SOLN
INTRAMUSCULAR | Status: DC | PRN
  Administered 2018-02-17: 25 mg via INTRAVENOUS

## 2018-02-17 MED ORDER — LACTATED RINGERS IV SOLN
INTRAVENOUS | Status: DC
  Administered 2018-02-17 (×2): via INTRAVENOUS

## 2018-02-17 MED ORDER — ACETAMINOPHEN 650 MG RE SUPP: 650.0000 mg | RECTAL | Status: DC | PRN

## 2018-02-17 MED ORDER — MIDAZOLAM HCL 5 MG/5ML IJ SOLN
INTRAMUSCULAR | Status: DC | PRN
  Administered 2018-02-17: 2 mg via INTRAVENOUS

## 2018-02-17 MED ORDER — SODIUM CHLORIDE 0.9 % IV SOLN: 250.0000 mL | INTRAVENOUS | Status: DC

## 2018-02-17 MED ORDER — DIAZEPAM 5 MG PO TABS: 5.0000 mg | ORAL_TABLET | Freq: Four times a day (QID) | ORAL | Status: DC | PRN

## 2018-02-17 MED ORDER — ATORVASTATIN CALCIUM 40 MG PO TABS
40.0000 mg | ORAL_TABLET | Freq: Every evening | ORAL | Status: DC
  Administered 2018-02-17 – 2018-02-19 (×3): 40 mg via ORAL
  Filled 2018-02-17 (×3): qty 1

## 2018-02-17 MED ORDER — ALBUTEROL SULFATE (2.5 MG/3ML) 0.083% IN NEBU: 3.0000 mL | INHALATION_SOLUTION | RESPIRATORY_TRACT | Status: DC | PRN

## 2018-02-17 MED ORDER — PHENOL 1.4 % MT LIQD: 1.0000 | OROMUCOSAL | Status: DC | PRN

## 2018-02-17 MED ORDER — PHENYLEPHRINE HCL 10 MG/ML IJ SOLN
INTRAMUSCULAR | Status: DC | PRN
  Administered 2018-02-17: 80 ug via INTRAVENOUS
  Administered 2018-02-17: 120 ug via INTRAVENOUS
  Administered 2018-02-17: 80 ug via INTRAVENOUS

## 2018-02-17 MED ORDER — POLYETHYLENE GLYCOL 3350 17 G PO PACK
17.0000 g | PACK | Freq: Every day | ORAL | Status: DC
  Administered 2018-02-18 – 2018-02-19 (×2): 17 g via ORAL
  Filled 2018-02-17 (×2): qty 1

## 2018-02-17 MED ORDER — THROMBIN 20000 UNITS EX SOLR
CUTANEOUS | Status: DC | PRN
  Administered 2018-02-17: 14:00:00 via TOPICAL

## 2018-02-17 MED ORDER — CEFAZOLIN SODIUM-DEXTROSE 2-4 GM/100ML-% IV SOLN
2.0000 g | INTRAVENOUS | Status: AC
  Administered 2018-02-17 (×2): 2 g via INTRAVENOUS

## 2018-02-17 MED ORDER — PHENYLEPHRINE 40 MCG/ML (10ML) SYRINGE FOR IV PUSH (FOR BLOOD PRESSURE SUPPORT)
PREFILLED_SYRINGE | INTRAVENOUS | Status: AC
  Filled 2018-02-17: qty 10

## 2018-02-17 MED ORDER — PROPOFOL 10 MG/ML IV BOLUS
INTRAVENOUS | Status: DC | PRN
  Administered 2018-02-17: 100 mg via INTRAVENOUS

## 2018-02-17 MED ORDER — CHLORHEXIDINE GLUCONATE CLOTH 2 % EX PADS: 6.0000 | MEDICATED_PAD | Freq: Once | CUTANEOUS | Status: DC

## 2018-02-17 MED ORDER — SODIUM CHLORIDE 0.9% FLUSH
3.0000 mL | Freq: Two times a day (BID) | INTRAVENOUS | Status: DC
  Administered 2018-02-17 – 2018-02-19 (×4): 3 mL via INTRAVENOUS

## 2018-02-17 MED ORDER — PROPOFOL 10 MG/ML IV BOLUS
INTRAVENOUS | Status: AC
  Filled 2018-02-17: qty 20

## 2018-02-17 MED ORDER — LIDOCAINE 2% (20 MG/ML) 5 ML SYRINGE
INTRAMUSCULAR | Status: DC | PRN
  Administered 2018-02-17: 60 mg via INTRAVENOUS

## 2018-02-17 MED ORDER — SODIUM CHLORIDE 0.9 % IV SOLN
INTRAVENOUS | Status: DC | PRN
  Administered 2018-02-17: 25 ug/min via INTRAVENOUS

## 2018-02-17 MED ORDER — ACETAMINOPHEN 325 MG PO TABS: 650.0000 mg | ORAL_TABLET | ORAL | Status: DC | PRN

## 2018-02-17 MED ORDER — DEXAMETHASONE SODIUM PHOSPHATE 10 MG/ML IJ SOLN
INTRAMUSCULAR | Status: DC | PRN
  Administered 2018-02-17: 10 mg via INTRAVENOUS

## 2018-02-17 MED ORDER — SUGAMMADEX SODIUM 200 MG/2ML IV SOLN
INTRAVENOUS | Status: DC | PRN
  Administered 2018-02-17: 200 mg via INTRAVENOUS

## 2018-02-17 MED ORDER — ALBUTEROL SULFATE (2.5 MG/3ML) 0.083% IN NEBU
3.0000 mL | INHALATION_SOLUTION | Freq: Four times a day (QID) | RESPIRATORY_TRACT | Status: DC
  Administered 2018-02-17: 3 mL via RESPIRATORY_TRACT
  Filled 2018-02-17: qty 3

## 2018-02-17 MED ORDER — BUPIVACAINE HCL (PF) 0.5 % IJ SOLN
INTRAMUSCULAR | Status: AC
  Filled 2018-02-17: qty 30

## 2018-02-17 MED ORDER — POTASSIUM CHLORIDE IN NACL 20-0.9 MEQ/L-% IV SOLN: INTRAVENOUS | Status: DC

## 2018-02-17 MED ORDER — SODIUM CHLORIDE 0.9% FLUSH: 3.0000 mL | INTRAVENOUS | Status: DC | PRN

## 2018-02-17 MED ORDER — ONDANSETRON HCL 4 MG/2ML IJ SOLN: 4.0000 mg | Freq: Four times a day (QID) | INTRAMUSCULAR | Status: DC | PRN

## 2018-02-17 MED ORDER — MIDAZOLAM HCL 2 MG/2ML IJ SOLN
INTRAMUSCULAR | Status: AC
  Filled 2018-02-17: qty 2

## 2018-02-17 MED ORDER — ALBUMIN HUMAN 5 % IV SOLN
INTRAVENOUS | Status: DC | PRN
  Administered 2018-02-17: 15:00:00 via INTRAVENOUS

## 2018-02-17 MED ORDER — FENTANYL CITRATE (PF) 250 MCG/5ML IJ SOLN
INTRAMUSCULAR | Status: DC | PRN
  Administered 2018-02-17: 100 ug via INTRAVENOUS
  Administered 2018-02-17 (×3): 50 ug via INTRAVENOUS

## 2018-02-17 MED ORDER — GABAPENTIN 300 MG PO CAPS
300.0000 mg | ORAL_CAPSULE | Freq: Three times a day (TID) | ORAL | Status: DC
  Administered 2018-02-17 – 2018-02-19 (×6): 300 mg via ORAL
  Filled 2018-02-17 (×6): qty 1

## 2018-02-17 MED ORDER — ALBUTEROL SULFATE (2.5 MG/3ML) 0.083% IN NEBU: 3.0000 mL | INHALATION_SOLUTION | Freq: Four times a day (QID) | RESPIRATORY_TRACT | Status: DC

## 2018-02-17 MED ORDER — LISINOPRIL 5 MG PO TABS
5.0000 mg | ORAL_TABLET | Freq: Every day | ORAL | Status: DC
  Administered 2018-02-18 – 2018-02-19 (×2): 5 mg via ORAL
  Filled 2018-02-17 (×2): qty 1

## 2018-02-17 MED ORDER — LIDOCAINE 2% (20 MG/ML) 5 ML SYRINGE
INTRAMUSCULAR | Status: AC
  Filled 2018-02-17: qty 5

## 2018-02-17 MED ORDER — 0.9 % SODIUM CHLORIDE (POUR BTL) OPTIME
TOPICAL | Status: DC | PRN
  Administered 2018-02-17: 1000 mL

## 2018-02-17 MED ORDER — METOCLOPRAMIDE HCL 5 MG/ML IJ SOLN
INTRAMUSCULAR | Status: DC | PRN
  Administered 2018-02-17: 10 mg via INTRAVENOUS

## 2018-02-17 MED ORDER — OXYCODONE HCL 5 MG PO TABS
10.0000 mg | ORAL_TABLET | ORAL | Status: DC | PRN
  Administered 2018-02-17 – 2018-02-19 (×4): 10 mg via ORAL
  Filled 2018-02-17 (×4): qty 2

## 2018-02-17 MED ORDER — MENTHOL 3 MG MT LOZG: 1.0000 | LOZENGE | OROMUCOSAL | Status: DC | PRN

## 2018-02-17 MED ORDER — DOCUSATE SODIUM 100 MG PO CAPS
100.0000 mg | ORAL_CAPSULE | Freq: Two times a day (BID) | ORAL | Status: DC
  Administered 2018-02-17 – 2018-02-19 (×5): 100 mg via ORAL
  Filled 2018-02-17 (×5): qty 1

## 2018-02-17 MED ORDER — OXYCODONE HCL 5 MG PO TABS: 5.0000 mg | ORAL_TABLET | ORAL | Status: DC | PRN

## 2018-02-17 MED ORDER — HYDROCODONE-ACETAMINOPHEN 7.5-325 MG PO TABS
1.0000 | ORAL_TABLET | Freq: Four times a day (QID) | ORAL | Status: DC
  Administered 2018-02-17 – 2018-02-19 (×4): 1 via ORAL
  Filled 2018-02-17 (×7): qty 1

## 2018-02-17 MED ORDER — ONDANSETRON HCL 4 MG PO TABS: 4.0000 mg | ORAL_TABLET | Freq: Four times a day (QID) | ORAL | Status: DC | PRN

## 2018-02-17 SURGICAL SUPPLY — 63 items
BENZOIN TINCTURE PRP APPL 2/3 (GAUZE/BANDAGES/DRESSINGS) IMPLANT
BLADE CLIPPER SURG (BLADE) IMPLANT
BUR MATCHSTICK NEURO 3.0 LAGG (BURR) ×3 IMPLANT
BUR PRECISION FLUTE 5.0 (BURR) ×3 IMPLANT
CAGE POST IBF 10X8D 26/9 (Cage) ×6 IMPLANT
CANISTER SUCT 3000ML PPV (MISCELLANEOUS) ×3 IMPLANT
CARTRIDGE OIL MAESTRO DRILL (MISCELLANEOUS) ×1 IMPLANT
CLOSURE WOUND 1/2 X4 (GAUZE/BANDAGES/DRESSINGS)
CONT SPEC 4OZ CLIKSEAL STRL BL (MISCELLANEOUS) ×3 IMPLANT
COVER BACK TABLE 60X90IN (DRAPES) ×3 IMPLANT
DECANTER SPIKE VIAL GLASS SM (MISCELLANEOUS) ×3 IMPLANT
DERMABOND ADVANCED (GAUZE/BANDAGES/DRESSINGS) ×2
DERMABOND ADVANCED .7 DNX12 (GAUZE/BANDAGES/DRESSINGS) ×1 IMPLANT
DIFFUSER DRILL AIR PNEUMATIC (MISCELLANEOUS) ×3 IMPLANT
DRAPE C-ARM 42X72 X-RAY (DRAPES) ×6 IMPLANT
DRAPE C-ARMOR (DRAPES) IMPLANT
DRAPE LAPAROTOMY 100X72X124 (DRAPES) ×3 IMPLANT
DRAPE POUCH INSTRU U-SHP 10X18 (DRAPES) ×3 IMPLANT
DRAPE SURG 17X23 STRL (DRAPES) ×3 IMPLANT
DURAPREP 26ML APPLICATOR (WOUND CARE) ×3 IMPLANT
ELECT REM PT RETURN 9FT ADLT (ELECTROSURGICAL) ×3
ELECTRODE REM PT RTRN 9FT ADLT (ELECTROSURGICAL) ×1 IMPLANT
GAUZE 4X4 16PLY RFD (DISPOSABLE) IMPLANT
GAUZE SPONGE 4X4 12PLY STRL (GAUZE/BANDAGES/DRESSINGS) IMPLANT
GLOVE ECLIPSE 6.5 STRL STRAW (GLOVE) ×6 IMPLANT
GLOVE EXAM NITRILE LRG STRL (GLOVE) IMPLANT
GLOVE EXAM NITRILE XL STR (GLOVE) IMPLANT
GLOVE EXAM NITRILE XS STR PU (GLOVE) IMPLANT
GOWN STRL REUS W/ TWL LRG LVL3 (GOWN DISPOSABLE) ×2 IMPLANT
GOWN STRL REUS W/ TWL XL LVL3 (GOWN DISPOSABLE) IMPLANT
GOWN STRL REUS W/TWL 2XL LVL3 (GOWN DISPOSABLE) IMPLANT
GOWN STRL REUS W/TWL LRG LVL3 (GOWN DISPOSABLE) ×4
GOWN STRL REUS W/TWL XL LVL3 (GOWN DISPOSABLE)
KIT BASIN OR (CUSTOM PROCEDURE TRAY) ×3 IMPLANT
KIT POSITION SURG JACKSON T1 (MISCELLANEOUS) ×3 IMPLANT
KIT TURNOVER KIT B (KITS) ×3 IMPLANT
MILL MEDIUM DISP (BLADE) IMPLANT
NEEDLE HYPO 25X1 1.5 SAFETY (NEEDLE) ×3 IMPLANT
NEEDLE SPNL 18GX3.5 QUINCKE PK (NEEDLE) IMPLANT
NS IRRIG 1000ML POUR BTL (IV SOLUTION) ×3 IMPLANT
OIL CARTRIDGE MAESTRO DRILL (MISCELLANEOUS) ×3
PACK LAMINECTOMY NEURO (CUSTOM PROCEDURE TRAY) ×3 IMPLANT
PAD ARMBOARD 7.5X6 YLW CONV (MISCELLANEOUS) ×6 IMPLANT
ROD RELINE 0-0 CON M 5.0/6.0MM (Rod) ×6 IMPLANT
ROD RELINE TI LATERAL MED OFF (Rod) ×6 IMPLANT
SCREW LOCK (Screw) ×4 IMPLANT
SCREW LOCK FXNS SPNE MAS PL (Screw) ×2 IMPLANT
SCREW LOCK RELINE 5.5 TULIP (Screw) ×12 IMPLANT
SCREW SHANK 5.5X30MM (Screw) ×3 IMPLANT
SCREW SHANKS 5.5X35 (Screw) ×3 IMPLANT
SCREW TULIP 5.5 (Screw) ×6 IMPLANT
SPONGE LAP 4X18 RFD (DISPOSABLE) IMPLANT
SPONGE SURGIFOAM ABS GEL 100 (HEMOSTASIS) ×3 IMPLANT
STRIP CLOSURE SKIN 1/2X4 (GAUZE/BANDAGES/DRESSINGS) IMPLANT
SUT PROLENE 6 0 BV (SUTURE) IMPLANT
SUT VIC AB 0 CT1 18XCR BRD8 (SUTURE) ×1 IMPLANT
SUT VIC AB 0 CT1 8-18 (SUTURE) ×2
SUT VIC AB 2-0 CT1 18 (SUTURE) ×3 IMPLANT
SUT VIC AB 3-0 SH 8-18 (SUTURE) ×3 IMPLANT
TOWEL GREEN STERILE (TOWEL DISPOSABLE) ×3 IMPLANT
TOWEL GREEN STERILE FF (TOWEL DISPOSABLE) ×3 IMPLANT
TRAY FOLEY MTR SLVR 16FR STAT (SET/KITS/TRAYS/PACK) ×3 IMPLANT
WATER STERILE IRR 1000ML POUR (IV SOLUTION) ×3 IMPLANT

## 2018-02-17 NOTE — H&P (Signed)
BP 132/78   Pulse 70   Temp 98.3 F (36.8 C) (Oral)   Resp 20   Ht 5' 3.5" (1.613 m)   Wt 70.5 kg   SpO2 96%   BMI 27.11 kg/m     Chase [name] comes in today for evaluation of severe pain.  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  No Known Allergies Past Medical History:  Diagnosis Date  . Arthritis   . Cancer [facility])    prostate 2017  . Depression   . Diabetes mellitus without complication ([place])   . Headache   . High cholesterol   . Hypertension   . Inguinal hernia    rt  . Neck pain   . Numbness and tingling in hands   . Urinary frequency    Past Surgical History:  Procedure Laterality Date  . ANTERIOR CERVICAL DECOMP/DISCECTOMY FUSION N/A 04/04/2015   Procedure: ANTERIOR CERVICAL DECOMPRESSION/DISCECTOMY FUSION PLATING BONEGRAFT CERVICAL FOUR-FIVE CERVICAL FIVE-SIX;  Surgeon: Ashok Pall, MD;  Location: [facility];  Service: Neurosurgery;  Laterality: N/A;  . BACK SURGERY     lower back with Dr. Christella Noa  . SHOULDER ARTHROSCOPY Right   . UPPER EXTREMITY VENOGRAPHY N/A 07/22/2016   Procedure: Upper Extremity Venography - bilateral arm;  Surgeon: Serafina Mitchell, MD;  Location: [place] [facility];  Service: Cardiovascular;  Laterality: N/A;   History reviewed. No pertinent family history. Social History   Socioeconomic History  . Marital status: Married    Spouse name: Not on file  . Number of children: Not on file  . Years of education: Not on file  . Highest education level: Not on file  Occupational History  . Not on file  Social Needs  . Financial resource strain: Not on file  . Food insecurity:    Worry: Not on file    Inability: Not on file  . Transportation needs:    Medical: Not on file    Non-medical: Not on file  Tobacco Use  . Smoking status: Current Every Day Smoker    Packs/day: 0.25    Years: 40.00    Pack years: 10.00    Types: Cigarettes  . Smokeless tobacco:  Never Used  . Tobacco comment: quit 1.5 months ago  Substance and Sexual Activity  . Alcohol use: Not Currently    Comment: occassionally  . Drug use: No  . Sexual activity: Not on file  Lifestyle  . Physical activity:    Days per week: Not on file    Minutes per session: Not on file  . Stress: Not on file  Relationships  . Social connections:    Talks on phone: Not on file    Gets together: Not on file    Attends religious service: Not on file    Active member of club or organization: Not on file    Attends meetings of clubs or organizations: Not on file    Relationship status: Not on file  . Intimate partner violence:    Fear of current or ex partner: Not on file    Emotionally abused: Not on file    Physically abused: Not on file    Forced sexual activity: Not on file  Other Topics Concern  . Not on file  Social History Narrative  . Not on file        Looking at the CT, I know he has a solid  fusion at 4-5.  More than likely, he is solid at 3-4, but from the look of it, I would not take out the screws there.  I do not believe there is anything other than to do an operation there.  He has a right-sided disc at 1-2, but almost all of his pain is on his left lower extremity.  I will try to get him booked for the OR as quickly as possible.      He is 5 feet 4 inches, weighs 149 pounds.  Temperature is 97.5, blood pressure is 113/73, pulse is 81, pain is 9/10.    He is retrolisthesed at 2-3, and clearly being above the level of fusion, I think that is what he needs.  He is in a great deal of pain.  He has had this pain since at least July 16th when he had the workup for the pain.  He is using a walker, in obvious discomfort.  Strength is slightly weak in the hip flexors and slightly weak in the quadriceps bilaterally.  He and his daughter came in today.  They understand having had the operation previously at 3-4 and 4-5.

## 2018-02-17 NOTE — Anesthesia Preprocedure Evaluation (Addendum)
Anesthesia Evaluation  Patient identified by MRN, date of birth, ID band Patient awake    Reviewed: Allergy & Precautions, H&P , NPO status , Patient's Chart, lab work & pertinent test results  Airway Mallampati: IV  TM Distance: >3 FB Neck ROM: Full  Mouth opening: Limited Mouth Opening  Dental no notable dental hx. (+) Teeth Intact, Dental Advisory Given   Pulmonary Current Smoker,    Pulmonary exam normal breath sounds clear to auscultation       Cardiovascular hypertension, Pt. on medications negative cardio ROS   Rhythm:Regular Rate:Normal     Neuro/Psych  Headaches, Depression    GI/Hepatic negative GI ROS, Neg liver ROS,   Endo/Other  diabetes, Type 2, Oral Hypoglycemic Agents  Renal/GU negative Renal ROS  negative genitourinary   Musculoskeletal  (+) Arthritis ,   Abdominal   Peds  Hematology negative hematology ROS (+)   Anesthesia Other Findings   Reproductive/Obstetrics negative OB ROS                            Anesthesia Physical Anesthesia Plan  ASA: II  Anesthesia Plan: General   Post-op Pain Management:    Induction: Intravenous  PONV Risk Score and Plan: 2 and Ondansetron and Midazolam  Airway Management Planned: Oral ETT and Video Laryngoscope Planned  Additional Equipment:   Intra-op Plan:   Post-operative Plan: Extubation in OR  Informed Consent: I have reviewed the patients History and Physical, chart, labs and discussed the procedure including the risks, benefits and alternatives for the proposed anesthesia with the patient or authorized representative who has indicated his/her understanding and acceptance.   Dental advisory given  Plan Discussed with: CRNA  Anesthesia Plan Comments:        Anesthesia Quick Evaluation

## 2018-02-17 NOTE — Transfer of Care (Signed)
Immediate Anesthesia Transfer of Care Note  Patient: Chase [name]  Procedure(s) Performed: Lumbar Two-Three Posterior Lumbar Interbody Fusion (N/A Spine Lumbar)  Patient Location: PACU  Anesthesia Type:General  Level of Consciousness: awake, alert , oriented and patient cooperative  Airway & Oxygen Therapy: Patient Spontanous Breathing and Patient connected to nasal cannula oxygen  Post-op Assessment: Report given to RN and Post -op Vital signs reviewed and stable  Post vital signs: Reviewed and stable  Last Vitals:  Vitals Value Taken Time  BP 112/66 02/17/2018  5:14 PM  Temp    Pulse 76 02/17/2018  5:17 PM  Resp 0 02/17/2018  5:17 PM  SpO2 100 % 02/17/2018  5:17 PM  Vitals shown include unvalidated device data.  Last Pain:  Vitals:   02/17/18 0900  TempSrc:   PainSc: 2          Complications: No apparent anesthesia complications

## 2018-02-18 MED FILL — Thrombin (Recombinant) For Soln 20000 Unit: CUTANEOUS | Qty: 1 | Status: AC

## 2018-02-18 NOTE — Progress Notes (Signed)
Occupational Therapy Evaluation Patient Details Name: Chase [name] MRN: [id number] DOB: [birth year] Today's Date: 02/18/2018    History of Present Illness 67 yo s/p Lumbar Two-Three Posterior Lumbar Interbody Fusion. PMH: back fusion; ACDF; DM;Cancer; HTN;   Clinical Impression   [employer] Interpreter Joellen Jersey [phone]) used during session. PTA, pt ambulating with RW and required occasional assistance with ADL due to BUE weakness. Pt currently requires min A with mobility @ RW level and mod A with ADL. Began education regarding compensatory strategies for ADL. Will follow acutely to facilitate safe DC home with HHOT and S with all mobility and ADL.     Follow Up Recommendations  Home health OT;Supervision/Assistance - S with all mobility and ADL   Equipment Recommendations  None recommended by OT    Recommendations for Other Services       Precautions / Restrictions Precautions Precautions: Back Precaution Booklet Issued: Yes (comment) Precaution Comments: need spainish version Required Braces or Orthoses: Spinal Brace Spinal Brace: (brace on order)      Mobility Bed Mobility Overal bed mobility: Needs Assistance Bed Mobility: Rolling;Sidelying to Sit Rolling: Min assist Sidelying to sit: Min assist          Transfers Overall transfer level: Needs assistance Equipment used: Rolling walker (2 wheeled) Transfers: Sit to/from Stand Sit to Stand: Min assist              Balance Overall balance assessment: Needs assistance   Sitting balance-Leahy Scale: Good       Standing balance-Leahy Scale: Poor Standing balance comment: reliant on external support                           ADL either performed or assessed with clinical judgement   ADL Overall ADL's : Needs assistance/impaired     Grooming: Set up;Supervision/safety;Sitting   Upper Body Bathing: Minimal assistance;Sitting   Lower Body Bathing: Moderate assistance;Sit to/from stand    Upper Body Dressing : Moderate assistance;Sitting   Lower Body Dressing: Moderate assistance;Sit to/from stand   Toilet Transfer: Minimal assistance;RW;Ambulation   Toileting- Clothing Manipulation and Hygiene: Moderate assistance;Sit to/from stand       Functional mobility during ADLs: Minimal assistance;Rolling walker;Cueing for safety;Cueing for sequencing General ADL Comments: Began education on compensatory techniqeus for ADL; pt has difficulty with buttons/zippers; drops cups/utensils due to hand weakness/numbness     Vision Baseline Vision/History: No visual deficits       Perception     Praxis      Pertinent Vitals/Pain Pain Assessment: 0-10 Pain Score: 4  Pain Location: back Pain Descriptors / Indicators: Aching;Discomfort Pain Intervention(s): Limited activity within patient's tolerance     Hand Dominance Right   Extremity/Trunk Assessment Upper Extremity Assessment Upper Extremity Assessment: Generalized weakness;RUE deficits/detail;LUE deficits/detail RUE Deficits / Details: RUE stronger than L; apparent weak intrinsics with decreased in hand manipulation skills and fine motor ability; better functional use RUE RUE Sensation: decreased light touch RUE Coordination: decreased fine motor LUE Sensation: decreased light touch LUE Coordination: decreased fine motor   Lower Extremity Assessment Lower Extremity Assessment: Defer to PT evaluation   Cervical / Trunk Assessment Cervical / Trunk Assessment: Other exceptions;Kyphotic(hx back and neck surgeries)   Communication Communication Communication: Prefers language other than English   Cognition Arousal/Alertness: Awake/alert Behavior During Therapy: WFL for tasks assessed/performed Overall Cognitive Status: Within Functional Limits for tasks assessed  General Comments       Exercises     Shoulder Instructions      Home Living Family/patient  expects to be discharged to:: Private residence Living Arrangements: Spouse/significant other;Children Available Help at Discharge: Available 24 hours/day;Family Type of Home: House Home Access: Stairs to enter [employer] of Steps: 4 Entrance Stairs-Rails: Right Home Layout: One level     Bathroom Shower/Tub: Tub/shower unit;Walk-in shower   Bathroom Toilet: Standard Bathroom Accessibility: Yes How Accessible: Accessible via walker Home Equipment: [place] - 2 wheels;Bedside commode;Cane - single point          Prior Functioning/Environment Level of Independence: Independent with assistive device(s)        Comments: used RW for walking for last 9 months        OT Problem List: Decreased strength;Decreased range of motion;Decreased activity tolerance;Impaired balance (sitting and/or standing);Decreased safety awareness;Decreased knowledge of use of DME or AE;Decreased knowledge of precautions;Pain;Impaired UE functional use      OT Treatment/Interventions: Self-care/ADL training;DME and/or AE instruction;Patient/family education;Therapeutic activities    OT Goals(Current goals can be found in the care plan section) Acute Rehab OT Goals Patient Stated Goal: to get stronger OT Goal Formulation: With patient Time For Goal Achievement: 03/04/18 Potential to Achieve Goals: Good  OT Frequency: Min 3X/week   Barriers to D/C:            Co-evaluation              AM-PAC PT "6 Clicks" Daily Activity     Outcome Measure Help from another person eating meals?: None Help from another person taking care of personal grooming?: A Little Help from another person toileting, which includes using toliet, bedpan, or urinal?: A Little Help from another person bathing (including washing, rinsing, drying)?: A Lot Help from another person to put on and taking off regular upper body clothing?: A Little Help from another person to put on and taking off regular lower body  clothing?: A Lot 6 Click Score: 17   End of Session Equipment Utilized During Treatment: Rolling walker Nurse Communication: Mobility status;Other (comment)(need for brace; nsg called [employer])  Activity Tolerance: Patient tolerated treatment well Patient left: Other (comment)(with PT)  OT Visit Diagnosis: Other abnormalities of gait and mobility (R26.89);Muscle weakness (generalized) (M62.81);Pain Pain - part of body: (back)                Time: 6295-2841 OT Time Calculation (min): 35 min Charges:  OT General Charges $OT Visit: 1 Visit OT Evaluation $OT Eval Moderate Complexity: 1 Mod OT Treatments $Self Care/Home Management : 8-22 mins  Maurie Boettcher, OT/L   Acute OT Clinical Specialist [place] Pager [phone] Office [phone]   [facility] 02/18/2018, 10:43 AM

## 2018-02-18 NOTE — Anesthesia Postprocedure Evaluation (Signed)
Anesthesia Post Note  Patient: Felder Ruiz-Guerrero  Procedure(s) Performed: Lumbar Two-Three Posterior Lumbar Interbody Fusion (N/A Spine Lumbar)     Patient location during evaluation: PACU Anesthesia Type: General Level of consciousness: awake and alert Pain management: pain level controlled Vital Signs Assessment: post-procedure vital signs reviewed and stable Respiratory status: spontaneous breathing, nonlabored ventilation, respiratory function stable and patient connected to nasal cannula oxygen Cardiovascular status: blood pressure returned to baseline and stable Postop Assessment: no apparent nausea or vomiting Anesthetic complications: no    Last Vitals:  Vitals:   02/17/18 2220 02/18/18 0455  BP: 118/68 106/68  Pulse: (!) 101 83  Resp:  19  Temp: 36.8 C 37.4 C  SpO2: 98% 97%    Last Pain:  Vitals:   02/18/18 0539  TempSrc:   PainSc: 1    Pain Goal:                 Reynard Christoffersen

## 2018-02-18 NOTE — Progress Notes (Signed)
Orthopedic Tech Progress Note Patient Details:  Chase [name] 1951/02/03 [id number]  Patient ID: Chase [name], male   DOB: 1950-08-28, 67 y.o.   MRN: [id number]   [name] 02/18/2018, 10:31 AM Called in bio-tech brace order because pt misplaced  The back brace and it needs to be replaced; spoke with Bella Kennedy

## 2018-02-18 NOTE — Evaluation (Addendum)
Physical Therapy Evaluation Patient Details Name: Chase [name] MRN: [id number] DOB: 10/23/1950 Today's Date: 02/18/2018   History of Present Illness  67 yo s/p Lumbar Two-Three Posterior Lumbar Interbody Fusion. PMH: back fusion; ACDF; DM;Cancer; HTN;  Clinical Impression  Pt admitted with/for elective lumbar fusion.  Pt needing min assist overall for basic mobility and gait.  Education initiated and will need some follow u.. [employer] interpretive services used during the session  Katie:  [phone] interpreted. Pt currently limited functionally due to the problems listed below.  (see problems list.)  Pt will benefit from PT to maximize function and safety to be able to get home safely with available assist.     Follow Up Recommendations Home health PT;Supervision/Assistance - 24 hour    Equipment Recommendations  None recommended by PT    Recommendations for Other Services       Precautions / Restrictions Precautions Precautions: Back Precaution Booklet Issued: Yes (comment) Precaution Comments: need spainish version Required Braces or Orthoses: Spinal Brace Spinal Brace: (brace on order)      Mobility  Bed Mobility Overal bed mobility: Needs Assistance Bed Mobility: Rolling;Sidelying to Sit Rolling: Min assist Sidelying to sit: Min assist          Transfers Overall transfer level: Needs assistance Equipment used: Rolling walker (2 wheeled) Transfers: Sit to/from Stand Sit to Stand: Min assist            Ambulation/Gait Ambulation/Gait assistance: Min assist;Min guard Gait Distance (Feet): 120 Feet Assistive device: Rolling walker (2 wheeled) Gait Pattern/deviations: Step-through pattern   Gait velocity interpretation: <1.8 ft/sec, indicate of risk for recurrent falls General Gait Details: mildly unsteady gait with guarded steps further steadied by RW.  Stairs            Wheelchair Mobility    Modified Rankin (Stroke Patients Only)        Balance Overall balance assessment: Needs assistance   Sitting balance-Leahy Scale: Good       Standing balance-Leahy Scale: Poor Standing balance comment: reliant on external support                             Pertinent Vitals/Pain Pain Assessment: 0-10 Pain Score: 4  Pain Location: back Pain Descriptors / Indicators: Aching;Discomfort Pain Intervention(s): Monitored during session    Home Living Family/patient expects to be discharged to:: Private residence Living Arrangements: Spouse/significant other;Children Available Help at Discharge: Available 24 hours/day;Family Type of Home: House Home Access: Stairs to enter Entrance Stairs-Rails: Right Entrance Stairs-Number of Steps: 4 Home Layout: One level Home Equipment: Environmental consultant - 2 wheels;Bedside commode;Cane - single point      Prior Function Level of Independence: Independent with assistive device(s)         Comments: used RW for walking for last 9 months     Hand Dominance   Dominant Hand: Right    Extremity/Trunk Assessment   Upper Extremity Assessment Upper Extremity Assessment: Generalized weakness;RUE deficits/detail;LUE deficits/detail RUE Deficits / Details: RUE stronger than L; apparent weak intrinsics with decreased in hand manipulation skills and fine motor ability; better functional use RUE RUE Sensation: decreased light touch RUE Coordination: decreased fine motor LUE Sensation: decreased light touch LUE Coordination: decreased fine motor    Lower Extremity Assessment Lower Extremity Assessment: Overall WFL for tasks assessed(general weakness proximal musculature bil)    Cervical / Trunk Assessment Cervical / Trunk Assessment: Other exceptions;Kyphotic(hx back and neck surgeries)  Communication   Communication:  Prefers language other than English  Cognition Arousal/Alertness: Awake/alert Behavior During Therapy: WFL for tasks assessed/performed Overall Cognitive Status:  Within Functional Limits for tasks assessed                                        General Comments General comments (skin integrity, edema, etc.): pt/wife educated on back prec/care through the interpreter including bracing issues, lifting precautions, log roll/transition to sit and progression of activity.    Exercises     Assessment/Plan    PT Assessment    PT Problem List         PT Treatment Interventions      PT Goals (Current goals can be found in the Care Plan section)  Acute Rehab PT Goals Patient Stated Goal: to get stronger PT Goal Formulation: With patient Time For Goal Achievement: 02/25/18 Potential to Achieve Goals: Good    Frequency Min 5X/week   Barriers to discharge        Co-evaluation               AM-PAC PT "6 Clicks" Daily Activity  Outcome Measure Difficulty turning over in bed (including adjusting bedclothes, sheets and blankets)?: Unable Difficulty moving from lying on back to sitting on the side of the bed? : Unable Difficulty sitting down on and standing up from a chair with arms (e.g., wheelchair, bedside commode, etc,.)?: Unable Help needed moving to and from a bed to chair (including a wheelchair)?: A Little Help needed walking in hospital room?: A Little Help needed climbing 3-5 steps with a railing? : A Little 6 Click Score: 12    End of Session   Activity Tolerance: Patient tolerated treatment well Patient left: in chair;with call bell/phone within reach Nurse Communication: Mobility status PT Visit Diagnosis: Unsteadiness on feet (R26.81);Other abnormalities of gait and mobility (R26.89);Pain Pain - part of body: (back)    Time: 6578-4696 PT Time Calculation (min) (ACUTE ONLY): 36 min   Charges:   PT Evaluation $PT Eval Low Complexity: 1 Low PT Treatments $Gait Training: 8-22 mins        02/18/2018  Donnella Sham, PT Acute Rehabilitation Services [phone]  (pager) [phone]   (office)  Tessie Fass Janece Laidlaw 02/18/2018, 11:40 AM

## 2018-02-18 NOTE — Progress Notes (Signed)
Patient ID: Chase [name], male   DOB: 1951/03/28, 67 y.o.   MRN: [id number] BP 110/69 (BP Location: Left Arm)   Pulse 85   Temp 98.1 F (36.7 C) (Oral)   Resp 18   Ht 5' 3.5" (1.613 m)   Wt 70.5 kg   SpO2 100%   BMI 27.11 kg/m  Alert and oriented x 4, speech is clear and fluent Moving all extremities Wound is cleaN, dry Doing well, complains of hip pain bilaterally Continue with PT, OT

## 2018-02-18 NOTE — Social Work (Signed)
CSW aware therapy recommendations are for home health services, pt will d/c home with partner for support.   CSW signing off. Please consult if any additional needs arise.  Alexander Mt, [place] Work [phone]

## 2018-02-18 NOTE — Care Management Note (Signed)
Case Management Note  Patient Details  Name: Chase [name] MRN: [id number] Date of Birth: 1950-09-10  Subjective/Objective:   67 yo s/p Lumbar Two-Three Posterior Lumbar Interbody Fusion.  PTA, pt independent with assistive devices; lives with spouse.                  Action/Plan: PT/OT recommending HH follow up.  MD, please leave orders for HHPT/OT; Case Manager will be happy to arrange.  Expected Discharge Date:                  Expected Discharge Plan:  [place]  In-House Referral:     Discharge planning Services  CM Consult  Post Acute Care Choice:    Choice offered to:     DME Arranged:    DME Agency:     HH Arranged:    [place] Agency:     Status of Service:  In process, will continue to follow  If discussed at Long Length of Stay Meetings, dates discussed:    Additional Comments:  Reinaldo Raddle, RN, BSN  Trauma/Neuro ICU Case Manager [phone]

## 2018-02-19 MED ORDER — OXYCODONE HCL 5 MG PO TABS: 5.0000 mg | ORAL_TABLET | Freq: Four times a day (QID) | ORAL | 0 refills | Status: DC | PRN

## 2018-02-19 MED ORDER — TIZANIDINE HCL 4 MG PO TABS: 4.0000 mg | ORAL_TABLET | Freq: Four times a day (QID) | ORAL | 0 refills | Status: DC | PRN

## 2018-02-19 NOTE — Progress Notes (Signed)
Discharge instructions given. Pt verbalized understanding and all questions were answered.  

## 2018-02-19 NOTE — Care Management Important Message (Signed)
Important Message  Patient Details  Name: Chase [name] MRN: [id number] Date of Birth: 13-Apr-1951   Medicare Important Message Given:  Yes    Tiernan Millikin 02/19/2018, 2:06 PM

## 2018-02-19 NOTE — Discharge Summary (Signed)
Physician Discharge Summary  Patient ID: Nishanth Mccaughan MRN: [id number] DOB/AGE: 1950/09/15 67 y.o.  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 male Whom was admitted and taken to the operating room for an uncomplicated lumbar fusion and decompression with Plif cages(synthes) and posterior instrumentation(nuvasive) pedicle screws Post op he is ambulating, voiding, and tolerating a regular diet He will be discharged today.  Treatments: surgery: L2/3 PLIF, non segmental pedicle screw fixation L2-4  Discharge Exam: Blood pressure 105/77, pulse 72, temperature 98 F (36.7 C), resp. rate 16, height 5' 3.5" (1.613 m), weight 70.5 kg, SpO2 99 %. General appearance: alert, cooperative, appears stated age and mild distress Neurologic: Alert and oriented X 3, normal strength and tone. Normal symmetric reflexes. Normal coordination and gait  Disposition:  Disc displacement, Lumbar  Allergies as of 02/19/2018   No Known Allergies     Medication List    STOP taking these medications   naproxen 500 MG tablet Commonly known as:  NAPROSYN     TAKE these medications   albuterol 108 (90 Base) MCG/ACT inhaler Commonly known as:  PROVENTIL HFA;VENTOLIN HFA Inhale 2 puffs into the lungs every 6 (six) hours.   atorvastatin 40 MG tablet Commonly known as:  LIPITOR Take 40 mg by mouth every evening.   docusate sodium 100 MG capsule Commonly known as:  COLACE Take 1 capsule (100 mg total) by mouth every 12 (twelve) hours.   FLOVENT HFA 110 MCG/ACT inhaler Generic drug:  fluticasone Inhale 2 puffs into the lungs 2 (two) times daily.   glyBURIDE-metformin 5-500 MG tablet Commonly known as:  GLUCOVANCE Take 1-2 tablets by  mouth See admin instructions. Take 2 tablets by mouth in the morning and 1 in the evening.   lisinopril 5 MG tablet Commonly known as:  PRINIVIL,ZESTRIL Take 5 mg by mouth daily.   oxyCODONE 5 MG immediate release tablet Commonly known as:  Oxy IR/ROXICODONE Take 1 tablet (5 mg total) by mouth every 6 (six) hours as needed for severe pain.   polyethylene glycol packet Commonly known as:  MIRALAX / GLYCOLAX Take 17 g by mouth daily.   tamsulosin 0.4 MG Caps capsule Commonly known as:  FLOMAX Take 0.4 mg by mouth every evening.   tiZANidine 4 MG tablet Commonly known as:  ZANAFLEX Take 1 tablet (4 mg total) by mouth every 6 (six) hours as needed for muscle spasms.      Follow-up Information    [facility]   Specialty:  Home Health Services Why:  Physical and occupational therapy to follow up with you at home. Contact information: [place] [address] [phone]        Ashok Pall, MD Follow up in 3 week(s).   Specialty:  Neurosurgery Why:  please call the office to make an appointment Contact information: [address] [place] [address] [phone]           Signed: Winfield Cunas 02/19/2018, 4:17 PM

## 2018-02-19 NOTE — Progress Notes (Signed)
Physical Therapy Treatment Patient Details Name: Chase [name] MRN: [id number] DOB: [birth year] Today's Date: 02/19/2018    History of Present Illness 67 yo s/p Lumbar Two-Three Posterior Lumbar Interbody Fusion. PMH: back fusion; ACDF; DM;Cancer; HTN;    PT Comments    Improving well.  Reinforced technique for bed mobility.  Emphasized gait stability and stamina.   Follow Up Recommendations  Home health PT;Supervision/Assistance - 24 hour     Equipment Recommendations  None recommended by PT    Recommendations for Other Services       Precautions / Restrictions Precautions Precautions: Back Required Braces or Orthoses: Spinal Brace Spinal Brace: Lumbar corset    Mobility  Bed Mobility Overal bed mobility: Needs Assistance Bed Mobility: Rolling;Sidelying to Sit;Sit to Sidelying Rolling: Min assist Sidelying to sit: Min assist     Sit to sidelying: Supervision General bed mobility comments: Reinforced safe technique  Transfers Overall transfer level: Needs assistance Equipment used: Rolling walker (2 wheeled) Transfers: Sit to/from Stand Sit to Stand: Supervision            Ambulation/Gait Ambulation/Gait assistance: Min guard Gait Distance (Feet): 240 Feet Assistive device: Rolling walker (2 wheeled) Gait Pattern/deviations: Step-through pattern Gait velocity: slower with more pain as tries to speed up. Gait velocity interpretation: 1.31 - 2.62 ft/sec, indicative of limited community ambulator General Gait Details: generally steady, but guarded.  Cues for posture and better use of the RW   Stairs Stairs: Yes Stairs assistance: Min assist Stair Management: One rail Right;Step to pattern;Sideways;Forwards Number of Stairs: 3 General stair comments: effortful with moderate use of the rail   Wheelchair Mobility    Modified Rankin (Stroke Patients Only)       Balance Overall balance assessment: Needs assistance   Sitting balance-Leahy Scale:  Good       Standing balance-Leahy Scale: Fair Standing balance comment: prefers use of the RW still                            Cognition Arousal/Alertness: Awake/alert Behavior During Therapy: WFL for tasks assessed/performed Overall Cognitive Status: Within Functional Limits for tasks assessed                                        Exercises      General Comments General comments (skin integrity, edema, etc.): reinforced back education with pt.      Pertinent Vitals/Pain Pain Assessment: Faces Faces Pain Scale: Hurts even more Pain Location: back(with mobility) Pain Descriptors / Indicators: Aching;Discomfort Pain Intervention(s): Limited activity within patient's tolerance;Repositioned    Home Living                      Prior Function            PT Goals (current goals can now be found in the care plan section) Acute Rehab PT Goals Patient Stated Goal: to get stronger PT Goal Formulation: With patient Time For Goal Achievement: 02/25/18 Potential to Achieve Goals: Good Progress towards PT goals: Progressing toward goals    Frequency    Min 5X/week      PT Plan Current plan remains appropriate    Co-evaluation              AM-PAC PT "6 Clicks" Daily Activity  Outcome Measure  Difficulty turning over in bed (including adjusting bedclothes,  sheets and blankets)?: A Little Difficulty moving from lying on back to sitting on the side of the bed? : A Lot Difficulty sitting down on and standing up from a chair with arms (e.g., wheelchair, bedside commode, etc,.)?: A Little Help needed moving to and from a bed to chair (including a wheelchair)?: A Little Help needed walking in hospital room?: A Little Help needed climbing 3-5 steps with a railing? : A Little 6 Click Score: 17    End of Session   Activity Tolerance: Patient tolerated treatment well Patient left: in chair;with call bell/phone within reach Nurse  Communication: Mobility status PT Visit Diagnosis: Unsteadiness on feet (R26.81);Other abnormalities of gait and mobility (R26.89);Pain Pain - part of body: (back)     Time: 6808-8110 PT Time Calculation (min) (ACUTE ONLY): 25 min  Charges:  $Gait Training: 8-22 mins $Therapeutic Activity: 8-22 mins                     02/19/2018  Donnella Sham, PT Acute Rehabilitation Services [phone]  (pager) [phone]  (office)   Tessie Fass Chamaine Stankus 02/19/2018, 5:36 PM

## 2018-02-19 NOTE — Progress Notes (Signed)
Occupational Therapy Treatment Patient Details Name: Chase [name] MRN: [id number] DOB: 1951-01-01 Today's Date: 02/19/2018    History of present illness 67 yo s/p Lumbar Two-Three Posterior Lumbar Interbody Fusion. PMH: back fusion; ACDF; DM;Cancer; HTN;   OT comments  [employer] Interpreter Carmen [phone]) used throughout session. Pt making significant progress. Completed education regarding back precautions, use of AE and DME. All further OT to be addressed by [facility].   Follow Up Recommendations  Home health OT;Supervision - Intermittent    Equipment Recommendations  None recommended by OT    Recommendations for Other Services      Precautions / Restrictions Precautions Precautions: Back Required Braces or Orthoses: Spinal Brace Spinal Brace: Lumbar corset       Mobility Bed Mobility Overal bed mobility: Needs Assistance Bed Mobility: Rolling;Sidelying to Sit;Sit to Sidelying Rolling: Min assist Sidelying to sit: Min assist     Sit to sidelying: Supervision General bed mobility comments: Reinforced safe technique  Transfers Overall transfer level: Needs assistance Equipment used: Rolling walker (2 wheeled) Transfers: Sit to/from Stand Sit to Stand: Supervision              Balance Overall balance assessment: Needs assistance           Standing balance-Leahy Scale: Fair                             ADL either performed or assessed with clinical judgement   ADL                                       Functional mobility during ADLs: Supervision/safety;Rolling walker General ADL Comments: Completed education regarding use of AE for LB ADL. PT will be able to be modified independent with use of AE and practice; reviewed correct way technique to donn brace     Vision       Perception     Praxis      Cognition Arousal/Alertness: Awake/alert Behavior During Therapy: WFL for tasks assessed/performed Overall Cognitive  Status: Within Functional Limits for tasks assessed                                          Exercises     Shoulder Instructions       General Comments      Pertinent Vitals/ Pain       Pain Assessment: Faces Faces Pain Scale: Hurts even more Pain Location: back(with mobility) Pain Descriptors / Indicators: Aching;Discomfort Pain Intervention(s): Limited activity within patient's tolerance;Repositioned  Home Living                                          Prior Functioning/Environment              Frequency  Min 3X/week        Progress Toward Goals  OT Goals(current goals can now be found in the care plan section)  Progress towards OT goals: Progressing toward goals  Acute Rehab OT Goals Patient Stated Goal: to get stronger OT Goal Formulation: With patient Time For Goal Achievement: 03/04/18 Potential to Achieve Goals: Good ADL Goals Pt Will Perform Grooming: with modified independence;standing  Pt Will Perform Lower Body Bathing: with modified independence;sit to/from stand;with adaptive equipment Pt Will Perform Lower Body Dressing: with modified independence;sit to/from stand;with adaptive equipment Pt Will Transfer to Toilet: with modified independence;ambulating Pt Will Perform Toileting - Clothing Manipulation and hygiene: with modified independence;sit to/from stand Additional ADL Goal #1: Pt will independently verbalize 3 back precautions  Plan Discharge plan remains appropriate    Co-evaluation                 AM-PAC PT "6 Clicks" Daily Activity     Outcome Measure   Help from another person eating meals?: None Help from another person taking care of personal grooming?: None Help from another person toileting, which includes using toliet, bedpan, or urinal?: A Little Help from another person bathing (including washing, rinsing, drying)?: A Little Help from another person to put on and taking off regular  upper body clothing?: A Little Help from another person to put on and taking off regular lower body clothing?: A Little 6 Click Score: 20    End of Session Equipment Utilized During Treatment: Rolling walker;Back brace  OT Visit Diagnosis: Other abnormalities of gait and mobility (R26.89);Muscle weakness (generalized) (M62.81);Pain Pain - part of body: (back)   Activity Tolerance Patient tolerated treatment well   Patient Left in bed;with call bell/phone within reach   Nurse Communication Mobility status        Time: 9390-3009 OT Time Calculation (min): 30 min  Charges: OT General Charges $OT Visit: 1 Visit OT Treatments $Self Care/Home Management : 23-37 mins  Chase [name], OT/L   Acute OT Clinical Specialist [place] Pager [phone] Office [phone]    [facility] 02/19/2018, 3:54 PM

## 2018-02-19 NOTE — Discharge Instructions (Signed)

## 2018-02-19 NOTE — Care Management Note (Signed)
Case Management Note  Patient Details  Name: Chase [name] MRN: [id number] Date of Birth: February 06, 1951  Subjective/Objective:   67 yo s/p Lumbar Two-Three Posterior Lumbar Interbody Fusion.  PTA, pt independent with assistive devices; lives with spouse.                  Action/Plan: PT/OT recommending HH follow up.  MD, please leave orders for HHPT/OT; Case Manager will be happy to arrange.  Expected Discharge Date:                  Expected Discharge Plan:  [place]  In-House Referral:     Discharge planning Services  CM Consult  Post Acute Care Choice:  Home Health Choice offered to:  Patient  DME Arranged:    DME Agency:     HH Arranged:  PT, OT HH Agency:  [facility]  Status of Service:  Completed, signed off  If discussed at [place] of Stay Meetings, dates discussed:    Additional Comments:  02/19/18 J. Annalee Meyerhoff, RN, BSN Pt for possible dc later today per MD.  Met with pt to discuss discharge arrangements; wife able to provide care at dc.  Pt agreeable to [facility] follow up; referral to [facility], per pt choice.  No DME needs per pt; has RW and 3 in 1 at home.  Start of care for [facility] 24-48h post dc date.  [employer] Video Interpreter used during our visit, though pt speaks and understands English well.    Reinaldo Raddle, RN, BSN  Trauma/Neuro ICU Case Manager [phone]

## 2018-02-19 NOTE — Progress Notes (Signed)
Upon assessment pt did not have a foley catheter present at shift change. This nurse documented the removal. Katherina Right RN

## 2018-02-21 ENCOUNTER — Other Ambulatory Visit: Payer: Self-pay

## 2018-02-21 ENCOUNTER — Emergency Department (HOSPITAL_COMMUNITY): Payer: Medicare Other

## 2018-02-21 ENCOUNTER — Encounter (HOSPITAL_COMMUNITY): Payer: Self-pay | Admitting: Emergency Medicine

## 2018-02-21 ENCOUNTER — Emergency Department (HOSPITAL_COMMUNITY)
Admission: EM | Admit: 2018-02-21 | Discharge: 2018-02-21 | Disposition: A | Discharge status: Home / Self Care | Payer: Medicare Other | Attending: Emergency Medicine | Admitting: Emergency Medicine

## 2018-02-21 DIAGNOSIS — R0602 Shortness of breath: Secondary | ICD-10-CM | POA: Diagnosis not present

## 2018-02-21 DIAGNOSIS — I10 Essential (primary) hypertension: Secondary | ICD-10-CM | POA: Insufficient documentation

## 2018-02-21 DIAGNOSIS — F17210 Nicotine dependence, cigarettes, uncomplicated: Secondary | ICD-10-CM | POA: Insufficient documentation

## 2018-02-21 DIAGNOSIS — Z7984 Long term (current) use of oral hypoglycemic drugs: Secondary | ICD-10-CM | POA: Insufficient documentation

## 2018-02-21 DIAGNOSIS — R140 Abdominal distension (gaseous): Secondary | ICD-10-CM | POA: Diagnosis not present

## 2018-02-21 DIAGNOSIS — R52 Pain, unspecified: Secondary | ICD-10-CM | POA: Diagnosis not present

## 2018-02-21 DIAGNOSIS — G8918 Other acute postprocedural pain: Secondary | ICD-10-CM | POA: Diagnosis not present

## 2018-02-21 DIAGNOSIS — F329 Major depressive disorder, single episode, unspecified: Secondary | ICD-10-CM | POA: Insufficient documentation

## 2018-02-21 DIAGNOSIS — M5489 Other dorsalgia: Secondary | ICD-10-CM | POA: Diagnosis not present

## 2018-02-21 DIAGNOSIS — Z8546 Personal history of malignant neoplasm of prostate: Secondary | ICD-10-CM | POA: Insufficient documentation

## 2018-02-21 DIAGNOSIS — R069 Unspecified abnormalities of breathing: Secondary | ICD-10-CM | POA: Diagnosis not present

## 2018-02-21 DIAGNOSIS — E1165 Type 2 diabetes mellitus with hyperglycemia: Secondary | ICD-10-CM | POA: Diagnosis not present

## 2018-02-21 DIAGNOSIS — N3289 Other specified disorders of bladder: Secondary | ICD-10-CM | POA: Diagnosis not present

## 2018-02-21 DIAGNOSIS — R1031 Right lower quadrant pain: Secondary | ICD-10-CM | POA: Diagnosis present

## 2018-02-21 DIAGNOSIS — Z79899 Other long term (current) drug therapy: Secondary | ICD-10-CM | POA: Diagnosis not present

## 2018-02-21 DIAGNOSIS — M549 Dorsalgia, unspecified: Secondary | ICD-10-CM | POA: Diagnosis not present

## 2018-02-21 DIAGNOSIS — E119 Type 2 diabetes mellitus without complications: Secondary | ICD-10-CM | POA: Insufficient documentation

## 2018-02-21 LAB — CBC WITH DIFFERENTIAL/PLATELET
Basophils Absolute: 0 10*3/uL (ref 0.0–0.1)
Basophils Relative: 1 %
Eosinophils Absolute: 0.3 10*3/uL (ref 0.0–0.7)
Eosinophils Relative: 4 %
HCT: 37.8 % — ABNORMAL LOW (ref 39.0–52.0)
Hemoglobin: 12.6 g/dL — ABNORMAL LOW (ref 13.0–17.0)
Lymphocytes Relative: 21 %
Lymphs Abs: 1.7 10*3/uL (ref 0.7–4.0)
MCH: 30.6 pg (ref 26.0–34.0)
MCHC: 33.3 g/dL (ref 30.0–36.0)
MCV: 91.7 fL (ref 78.0–100.0)
Monocytes Absolute: 0.7 10*3/uL (ref 0.1–1.0)
Monocytes Relative: 8 %
Neutro Abs: 5.3 10*3/uL (ref 1.7–7.7)
Neutrophils Relative %: 66 %
Platelets: 209 10*3/uL (ref 150–400)
RBC: 4.12 MIL/uL — ABNORMAL LOW (ref 4.22–5.81)
RDW: 13.3 % (ref 11.5–15.5)
WBC: 8 10*3/uL (ref 4.0–10.5)

## 2018-02-21 LAB — BASIC METABOLIC PANEL
Anion gap: 12 (ref 5–15)
BUN: 14 mg/dL (ref 8–23)
CO2: 25 mmol/L (ref 22–32)
Calcium: 9.5 mg/dL (ref 8.9–10.3)
Chloride: 97 mmol/L — ABNORMAL LOW (ref 98–111)
Creatinine, Ser: 0.66 mg/dL (ref 0.61–1.24)
GFR calc Af Amer: >60 mL/min (ref >60)
GFR calc non Af Amer: >60 mL/min (ref >60)
Glucose, Bld: 277 mg/dL — ABNORMAL HIGH (ref 70–99)
Potassium: 4.1 mmol/L (ref 3.5–5.1)
Sodium: 134 mmol/L — ABNORMAL LOW (ref 135–145)

## 2018-02-21 LAB — URINALYSIS, ROUTINE W REFLEX MICROSCOPIC
Bacteria, UA: NONE SEEN
Bilirubin Urine: NEGATIVE
Glucose, UA: >=500 mg/dL — AB
Hgb urine dipstick: NEGATIVE
Ketones, ur: NEGATIVE mg/dL
Leukocytes, UA: NEGATIVE
Nitrite: NEGATIVE
Protein, ur: NEGATIVE mg/dL
Specific Gravity, Urine: 1.014 (ref 1.005–1.030)
pH: 7 (ref 5.0–8.0)

## 2018-02-21 MED ORDER — SODIUM CHLORIDE 0.9 % IV BOLUS
1000.0000 mL | Freq: Once | INTRAVENOUS | Status: AC
  Administered 2018-02-21: 1000 mL via INTRAVENOUS

## 2018-02-21 MED ORDER — IOPAMIDOL (ISOVUE-370) INJECTION 76%
100.0000 mL | Freq: Once | INTRAVENOUS | Status: AC | PRN
  Administered 2018-02-21: 100 mL via INTRAVENOUS

## 2018-02-21 MED ORDER — FENTANYL CITRATE (PF) 100 MCG/2ML IJ SOLN
50.0000 ug | Freq: Once | INTRAMUSCULAR | Status: AC
  Administered 2018-02-21: 50 ug via INTRAVENOUS
  Filled 2018-02-21: qty 2

## 2018-02-21 NOTE — ED Notes (Signed)
Pt ambulated and tolerated well.  States his pain increases with transferring from sitting to laying, but overall is better.

## 2018-02-21 NOTE — ED Triage Notes (Signed)
Patient brought in by EMS. Patient had Back surgery on the 18 th . Patient complaining of back pain. EMS reports patient has been hypotensive. Patient was given 2 capsules of oxycodone at 4 am. Patient states no relief. Pro air inhaler around 3 am due to breathing problems. Patient currently 97 percent on room air. Patient states last BM yesterday. Patient does have some abdominal distension. Patient conscious and alert. Patient speaks little [place].

## 2018-02-21 NOTE — ED Notes (Signed)
Patient's family at bedside and able to help translate for patient. Patient speaks little [place].

## 2018-02-21 NOTE — ED Provider Notes (Signed)
[facility] EMERGENCY DEPARTMENT Provider Note   CSN: [id number] Arrival date & time: 02/21/18  5784     History   Chief Complaint Chief Complaint  Patient presents with  . Post-op Problem    HPI Leven Hoel is a 67 y.o. male.  HPI  67 year old male, he has a known history of diabetes, hypertension high cholesterol, he had a lumbar fusion surgery done on 18 September by Dr. Christella Noa, he states that the same day that he was discharged he started having increasing pain in his right flank, this is different from the back pain for which she was admitted to the hospital.  He states that he has chronic pain and numbness in his legs, this was not changed after surgery and is still persistent today.  This is not related to the new pain.  This pain is located right of the midline into the flank and side, it is worse with movement, it is not associated with any hematuria dysuria or frequency.  He has no pain radiating to the abdomen or the groin, there is no pain radiating down the legs beside his chronic pain.  He reports that he has been using his pain medications, he is chronically constipated and has not had a bowel movement in some time.  The patient reports that he has had some increasing shortness of breath and anxiety over the last couple of days stating that sometimes he feels that it is hard to breathe.  He felt that again this morning.  Paramedics noted that the patient was potentially hypotensive or hypoxic however on arrival he is neither.  He denies any coughing or fevers.  He denies any swelling of his legs.  He denies any drainage from the dressing.  Past Medical History:  Diagnosis Date  . Arthritis   . Cancer [facility])    prostate 2017  . Depression   . Diabetes mellitus without complication ([place])   . Headache   . High cholesterol   . Hypertension   . Inguinal hernia    rt  . Neck pain   . Numbness and tingling in hands   . Urinary frequency     Patient Active  Problem List   Diagnosis Date Noted  . Spondylolisthesis of lumbar region 02/17/2018  . Osteoarthritis of spine with radiculopathy, lumbar region 08/01/2015  . HNP (herniated nucleus pulposus) with myelopathy, cervical 04/04/2015    Past Surgical History:  Procedure Laterality Date  . ANTERIOR CERVICAL DECOMP/DISCECTOMY FUSION N/A 04/04/2015   Procedure: ANTERIOR CERVICAL DECOMPRESSION/DISCECTOMY FUSION PLATING BONEGRAFT CERVICAL FOUR-FIVE CERVICAL FIVE-SIX;  Surgeon: Ashok Pall, MD;  Location: [facility];  Service: Neurosurgery;  Laterality: N/A;  . BACK SURGERY     lower back with Dr. Christella Noa  . SHOULDER ARTHROSCOPY Right   . UPPER EXTREMITY VENOGRAPHY N/A 07/22/2016   Procedure: Upper Extremity Venography - bilateral arm;  Surgeon: Serafina Mitchell, MD;  Location: [facility];  Service: Cardiovascular;  Laterality: N/A;        Home Medications    Prior to Admission medications   Medication Sig Start Date End Date Taking? Authorizing Provider  albuterol (PROVENTIL HFA;VENTOLIN HFA) 108 (90 Base) MCG/ACT inhaler Inhale 2 puffs into the lungs every 6 (six) hours. 12/19/17   [provider]  atorvastatin (LIPITOR) 40 MG tablet Take 40 mg by mouth every evening.     [provider]  docusate sodium (COLACE) 100 MG capsule Take 1 capsule (100 mg total) by mouth every 12 (  twelve) hours. 12/22/17   Isla Pence, MD  FLOVENT HFA 110 MCG/ACT inhaler Inhale 2 puffs into the lungs 2 (two) times daily. 12/19/17   [provider]  glyBURIDE-metformin (GLUCOVANCE) 5-500 MG tablet Take 1-2 tablets by mouth See admin instructions. Take 2 tablets by mouth in the morning and 1 in the evening. 09/18/17   [provider]  lisinopril (PRINIVIL,ZESTRIL) 5 MG tablet Take 5 mg by mouth daily.    [provider]  oxyCODONE (ROXICODONE) 5 MG immediate release tablet Take 1 tablet (5 mg total) by mouth every 6 (six) hours as needed for severe pain. 02/19/18    Ashok Pall, MD  polyethylene glycol [facility]) packet Take 17 g by mouth daily. 12/22/17   Isla Pence, MD  tamsulosin (FLOMAX) 0.4 MG CAPS capsule Take 0.4 mg by mouth every evening.    [provider]  tiZANidine (ZANAFLEX) 4 MG tablet Take 1 tablet (4 mg total) by mouth every 6 (six) hours as needed for muscle spasms. 02/19/18   Ashok Pall, MD    Family History History reviewed. No pertinent family history.  Social History Social History   Tobacco Use  . Smoking status: Current Every Day Smoker    Packs/day: 0.25    Years: 40.00    Pack years: 10.00    Types: Cigarettes  . Smokeless tobacco: Never Used  . Tobacco comment: quit 1.5 months ago  Substance Use Topics  . Alcohol use: Not Currently    Comment: occassionally  . Drug use: No     Allergies   Patient has no known allergies.   Review of Systems Review of Systems  All other systems reviewed and are negative.    Physical Exam Updated Vital Signs BP 106/66   Pulse 65   Temp 97.9 F (36.6 C) (Oral)   Resp 20   Ht 1.626 m (5\' 4" )   Wt 68 kg   SpO2 100%   BMI 25.75 kg/m   Physical Exam  Constitutional: He appears well-developed and well-nourished. No distress.  HENT:  Head: Normocephalic and atraumatic.  Mouth/Throat: Oropharynx is clear and moist. No oropharyngeal exudate.  Eyes: Pupils are equal, round, and reactive to light. Conjunctivae and EOM are normal. Right eye exhibits no discharge. Left eye exhibits no discharge. No scleral icterus.  Neck: Normal range of motion. Neck supple. No JVD present. No thyromegaly present.  Cardiovascular: Normal rate, regular rhythm, normal heart sounds and intact distal pulses. Exam reveals no gallop and no friction rub.  No murmur heard. Normal rate, normal pulses, no edema, no JVD  Pulmonary/Chest: Effort normal and breath sounds normal. No respiratory distress. He has no wheezes. He has no rales.  Lung sounds are clear, there is no increased work  of breathing, no rales or wheezing.  Abdominal: Soft. Bowel sounds are normal. He exhibits distension ( diffuse tympanitic sounds to percussion but very soft and NT). He exhibits no mass. There is no tenderness. There is no rebound and no guarding.  Musculoskeletal: Normal range of motion. He exhibits no edema or tenderness.  No edema,  Lymphadenopathy:    He has no cervical adenopathy.  Neurological: He is alert. Coordination normal.  Able to lift both legs without difficulty, normal SLR with strength at hips - normal strength at knees to flexion and extention and at the ankles with dorsiflexion and plantarflexion - has dec sensation bilaterally which he reports is chronic  Skin: Skin is warm and dry. No rash noted. No erythema.  Wound is clean and in tact - dressing with small am't of blood - no purulence or foul smell  Psychiatric: He has a normal mood and affect. His behavior is normal.  Nursing note and vitals reviewed.    ED Treatments / Results  Labs (all labs ordered are listed, but only abnormal results are displayed) Labs Reviewed  CBC WITH DIFFERENTIAL/PLATELET - Abnormal; Notable for the following components:      Result Value   RBC 4.12 (*)    Hemoglobin 12.6 (*)    HCT 37.8 (*)    All other components within normal limits  BASIC METABOLIC PANEL - Abnormal; Notable for the following components:   Sodium 134 (*)    Chloride 97 (*)    Glucose, Bld 277 (*)    All other components within normal limits  URINALYSIS, ROUTINE W REFLEX MICROSCOPIC - Abnormal; Notable for the following components:   Glucose, UA >=500 (*)    All other components within normal limits    EKG None   Radiology Ct Angio Chest Pe W And/or Wo Contrast  Result Date: 02/21/2018 CLINICAL DATA:  Back pain, hypotension of, abdominal distension EXAM: CT ANGIOGRAPHY CHEST CT ABDOMEN AND PELVIS WITH CONTRAST TECHNIQUE: Multidetector CT imaging of the chest was performed using the standard protocol during  bolus administration of intravenous contrast. Multiplanar CT image reconstructions and MIPs were obtained to evaluate the vascular anatomy. Multidetector CT imaging of the abdomen and pelvis was performed using the standard protocol during bolus administration of intravenous contrast. CONTRAST:  163mL ISOVUE-370 IOPAMIDOL (ISOVUE-370) INJECTION 76% COMPARISON:  CT abdomen/pelvis dated 12/18/2017 FINDINGS: CTA CHEST FINDINGS Cardiovascular: Satisfactory opacification of the bilateral pulmonary arteries to the segmental level. No evidence of pulmonary embolism. No evidence of thoracic aortic aneurysm or dissection. Mild atherosclerotic calcifications of the aortic arch. The heart is normal in size.  No pericardial effusion. Mediastinum/Nodes: No suspicious mediastinal lymphadenopathy. Visualized thyroid is unremarkable. Lungs/Pleura: Mild dependent atelectasis in the bilateral lower lobes. No focal consolidation. No suspicious pulmonary nodules. Prominent subpleural fat at the posterior left lung base. No pleural effusion or pneumothorax. Musculoskeletal: Visualized osseous structures are within normal limits. Cervical spine fixation hardware, incompletely visualized. Review of the MIP images confirms the above findings. CT ABDOMEN and PELVIS FINDINGS Hepatobiliary: 15 mm cyst along the posterior aspect of segment 7 (series 12/image 17). Gallbladder is unremarkable. No intrahepatic or extrahepatic ductal dilatation. Pancreas: Within normal limits. Spleen: Within normal limits. Adrenals/Urinary Tract: Adrenal glands are within normal limits. Multiple bilateral renal cysts, measuring up to 2.6 cm in the anterior left lower kidney (series 12/image 41) and 2.6 cm in the lateral right lower kidney (series 12/image 43). No enhancing renal lesions. No hydronephrosis. Bladder is mildly thick-walled. Stomach/Bowel: Stomach is within normal limits. No evidence of bowel obstruction. Normal appendix (series 12/image 69).  Vascular/Lymphatic: No evidence of abdominal aortic aneurysm. Atherosclerotic calcifications of the abdominal aorta and branch vessels. No suspicious abdominopelvic lymphadenopathy. Reproductive: Brachytherapy seeds in the prostate. Other: No abdominopelvic ascites. Fat in the bilateral inguinal canals. Musculoskeletal: Status post PLIF at L2-5. Mild degenerative changes of the lumbar spine. Review of the MIP images confirms the above findings. IMPRESSION: No evidence of pulmonary embolism. No evidence of acute cardiopulmonary disease. Status post PLIF at L2-5.  No evidence of complication. Brachytherapy seeds in the prostate. No evidence of recurrent or metastatic disease. Mildly thick-walled bladder, likely reflecting chronic bladder outlet obstruction. Additional ancillary findings as above. Electronically Signed   By: Henderson Newcomer.D.  On: 02/21/2018 08:06   Ct Abdomen Pelvis W Contrast  Result Date: 02/21/2018 CLINICAL DATA:  Back pain, hypotension of, abdominal distension EXAM: CT ANGIOGRAPHY CHEST CT ABDOMEN AND PELVIS WITH CONTRAST TECHNIQUE: Multidetector CT imaging of the chest was performed using the standard protocol during bolus administration of intravenous contrast. Multiplanar CT image reconstructions and MIPs were obtained to evaluate the vascular anatomy. Multidetector CT imaging of the abdomen and pelvis was performed using the standard protocol during bolus administration of intravenous contrast. CONTRAST:  175mL ISOVUE-370 IOPAMIDOL (ISOVUE-370) INJECTION 76% COMPARISON:  CT abdomen/pelvis dated 12/18/2017 FINDINGS: CTA CHEST FINDINGS Cardiovascular: Satisfactory opacification of the bilateral pulmonary arteries to the segmental level. No evidence of pulmonary embolism. No evidence of thoracic aortic aneurysm or dissection. Mild atherosclerotic calcifications of the aortic arch. The heart is normal in size.  No pericardial effusion. Mediastinum/Nodes: No suspicious mediastinal  lymphadenopathy. Visualized thyroid is unremarkable. Lungs/Pleura: Mild dependent atelectasis in the bilateral lower lobes. No focal consolidation. No suspicious pulmonary nodules. Prominent subpleural fat at the posterior left lung base. No pleural effusion or pneumothorax. Musculoskeletal: Visualized osseous structures are within normal limits. Cervical spine fixation hardware, incompletely visualized. Review of the MIP images confirms the above findings. CT ABDOMEN and PELVIS FINDINGS Hepatobiliary: 15 mm cyst along the posterior aspect of segment 7 (series 12/image 17). Gallbladder is unremarkable. No intrahepatic or extrahepatic ductal dilatation. Pancreas: Within normal limits. Spleen: Within normal limits. Adrenals/Urinary Tract: Adrenal glands are within normal limits. Multiple bilateral renal cysts, measuring up to 2.6 cm in the anterior left lower kidney (series 12/image 41) and 2.6 cm in the lateral right lower kidney (series 12/image 43). No enhancing renal lesions. No hydronephrosis. Bladder is mildly thick-walled. Stomach/Bowel: Stomach is within normal limits. No evidence of bowel obstruction. Normal appendix (series 12/image 69). Vascular/Lymphatic: No evidence of abdominal aortic aneurysm. Atherosclerotic calcifications of the abdominal aorta and branch vessels. No suspicious abdominopelvic lymphadenopathy. Reproductive: Brachytherapy seeds in the prostate. Other: No abdominopelvic ascites. Fat in the bilateral inguinal canals. Musculoskeletal: Status post PLIF at L2-5. Mild degenerative changes of the lumbar spine. Review of the MIP images confirms the above findings. IMPRESSION: No evidence of pulmonary embolism. No evidence of acute cardiopulmonary disease. Status post PLIF at L2-5.  No evidence of complication. Brachytherapy seeds in the prostate. No evidence of recurrent or metastatic disease. Mildly thick-walled bladder, likely reflecting chronic bladder outlet obstruction. Additional  ancillary findings as above. Electronically Signed   By: Julian Hy M.D.   On: 02/21/2018 08:06    Procedures Procedures (including critical care time)  Medications Ordered in ED Medications  fentaNYL (SUBLIMAZE) injection 50 mcg (50 mcg Intravenous Given 02/21/18 0726)  iopamidol (ISOVUE-370) 76 % injection 100 mL (100 mLs Intravenous Contrast Given 02/21/18 0741)  sodium chloride 0.9 % bolus 1,000 mL ( Intravenous Stopped 02/21/18 0844)     Initial Impression / Assessment and Plan / ED Course  I have reviewed the triage vital signs and the nursing notes.  Pertinent labs & imaging results that were available during my care of the patient were reviewed by me and considered in my medical decision making (see chart for details).  Clinical Course as of Feb 22 852  Sun Feb 21, 2018  3220 CT scans show no post operative pain - no complicating renal issues - UA with glucose but no infection - pt informed - he has ambulated and has normal Vs without hypotension.   [BM]    Clinical Course User Index [BM] Noemi Chapel, MD  The patient does not appear anxious, he has a normal respiratory pattern, he states that he has been short of breath intermittently but does not appear visibly short of breath at this time.  He also has chronic neurologic deficits in his legs which are not new.  He has reproducible tenderness over his flank and side and other than having a small amount of drainage from the wound directly into the dressing which is an extremely small amount, there is no other abnormal findings.  I see no swelling, no fluid collections are palpable, his abdomen is distended and tympanitic but is nontender and consistent with a constipated state.  At this time I feel would be important to rule out pulmonary embolism but also to rule out postsurgical complications.  He does not appear to have any spinal cord compression or injury based on his neurologic exam and this seems to be far lateral  compared to the location of the surgery.  CT scans ordered, will evaluate chest for lung pathology, pulmonary embolism and abdominal or back pathology with CT scan.  Labs are unremarkable with no leukocytosis, no renal dysfunction, IV fluids and pain medications will be ordered.  The pt is asking for medicine for his anxiety and his pain at the same time - he was informed that he cannot be given both Benzo's and Opiates at the same time due to risk of respiratory depression.  Labs and imaging reassuring - pt agreeable to d/c Able to ambulate without much pain 3/10 at this time.    Final Clinical Impressions(s) / ED Diagnoses   Final diagnoses:  Post-operative pain      Noemi Chapel, MD 02/21/18 [phone]

## 2018-02-21 NOTE — Discharge Instructions (Signed)
Your testing shows no acute findings Your lungs and heart look normal You do not have a blood clot or a pneumonia Your back surgical site looks good  You do have cysts on your kidney's which are not causing problems - Let your doctor know that you were in the ER for testing and have them obtain your records for review ER for increased pain / numbness or weakness of the legs or fever

## 2018-02-22 ENCOUNTER — Ambulatory Visit: Payer: Medicare Other | Admitting: Family Medicine

## 2018-02-22 DIAGNOSIS — Z7951 Long term (current) use of inhaled steroids: Secondary | ICD-10-CM | POA: Diagnosis not present

## 2018-02-22 DIAGNOSIS — M48062 Spinal stenosis, lumbar region with neurogenic claudication: Secondary | ICD-10-CM | POA: Diagnosis not present

## 2018-02-22 DIAGNOSIS — E119 Type 2 diabetes mellitus without complications: Secondary | ICD-10-CM | POA: Diagnosis not present

## 2018-02-22 DIAGNOSIS — M1990 Unspecified osteoarthritis, unspecified site: Secondary | ICD-10-CM | POA: Diagnosis not present

## 2018-02-22 DIAGNOSIS — M4316 Spondylolisthesis, lumbar region: Secondary | ICD-10-CM | POA: Diagnosis not present

## 2018-02-22 DIAGNOSIS — Z4789 Encounter for other orthopedic aftercare: Secondary | ICD-10-CM | POA: Diagnosis not present

## 2018-02-22 DIAGNOSIS — F329 Major depressive disorder, single episode, unspecified: Secondary | ICD-10-CM | POA: Diagnosis not present

## 2018-02-22 DIAGNOSIS — F17210 Nicotine dependence, cigarettes, uncomplicated: Secondary | ICD-10-CM | POA: Diagnosis not present

## 2018-02-22 DIAGNOSIS — Z8546 Personal history of malignant neoplasm of prostate: Secondary | ICD-10-CM | POA: Diagnosis not present

## 2018-02-22 DIAGNOSIS — I10 Essential (primary) hypertension: Secondary | ICD-10-CM | POA: Diagnosis not present

## 2018-02-22 DIAGNOSIS — Z981 Arthrodesis status: Secondary | ICD-10-CM | POA: Diagnosis not present

## 2018-02-23 DIAGNOSIS — I10 Essential (primary) hypertension: Secondary | ICD-10-CM | POA: Diagnosis not present

## 2018-02-23 DIAGNOSIS — E119 Type 2 diabetes mellitus without complications: Secondary | ICD-10-CM | POA: Diagnosis not present

## 2018-02-23 DIAGNOSIS — Z4789 Encounter for other orthopedic aftercare: Secondary | ICD-10-CM | POA: Diagnosis not present

## 2018-02-23 DIAGNOSIS — F17210 Nicotine dependence, cigarettes, uncomplicated: Secondary | ICD-10-CM | POA: Diagnosis not present

## 2018-02-23 DIAGNOSIS — M48062 Spinal stenosis, lumbar region with neurogenic claudication: Secondary | ICD-10-CM | POA: Diagnosis not present

## 2018-02-23 DIAGNOSIS — M4316 Spondylolisthesis, lumbar region: Secondary | ICD-10-CM | POA: Diagnosis not present

## 2018-02-24 ENCOUNTER — Encounter: Payer: Self-pay | Admitting: Physician Assistant

## 2018-02-24 ENCOUNTER — Ambulatory Visit (INDEPENDENT_AMBULATORY_CARE_PROVIDER_SITE_OTHER): Payer: Medicare Other | Admitting: Physician Assistant

## 2018-02-24 VITALS — BP 107/72 | HR 96 | Temp 98.4°F

## 2018-02-24 DIAGNOSIS — R0602 Shortness of breath: Secondary | ICD-10-CM

## 2018-02-24 DIAGNOSIS — M5000 Cervical disc disorder with myelopathy, unspecified cervical region: Secondary | ICD-10-CM | POA: Diagnosis not present

## 2018-02-24 DIAGNOSIS — M4316 Spondylolisthesis, lumbar region: Secondary | ICD-10-CM

## 2018-02-24 DIAGNOSIS — R079 Chest pain, unspecified: Secondary | ICD-10-CM | POA: Diagnosis not present

## 2018-02-24 MED ORDER — KETOROLAC TROMETHAMINE 60 MG/2ML IM SOLN
60.0000 mg | Freq: Once | INTRAMUSCULAR | Status: AC
  Administered 2018-02-24: 60 mg via INTRAMUSCULAR

## 2018-02-24 MED ORDER — PROMETHAZINE HCL 25 MG/ML IJ SOLN
25.0000 mg | Freq: Once | INTRAMUSCULAR | Status: AC
  Administered 2018-02-24: 25 mg via INTRAMUSCULAR

## 2018-02-24 MED ORDER — METHOCARBAMOL 500 MG PO TABS: 500.0000 mg | ORAL_TABLET | Freq: Four times a day (QID) | ORAL | 0 refills | Status: DC | PRN

## 2018-02-24 MED ORDER — HYDROCODONE-ACETAMINOPHEN 10-325 MG PO TABS: 1.0000 | ORAL_TABLET | ORAL | 0 refills | Status: DC | PRN

## 2018-02-25 DIAGNOSIS — M48062 Spinal stenosis, lumbar region with neurogenic claudication: Secondary | ICD-10-CM | POA: Diagnosis not present

## 2018-02-25 DIAGNOSIS — M4316 Spondylolisthesis, lumbar region: Secondary | ICD-10-CM | POA: Diagnosis not present

## 2018-02-25 DIAGNOSIS — Z4789 Encounter for other orthopedic aftercare: Secondary | ICD-10-CM | POA: Diagnosis not present

## 2018-02-25 DIAGNOSIS — E119 Type 2 diabetes mellitus without complications: Secondary | ICD-10-CM | POA: Diagnosis not present

## 2018-02-25 DIAGNOSIS — F17210 Nicotine dependence, cigarettes, uncomplicated: Secondary | ICD-10-CM | POA: Diagnosis not present

## 2018-02-25 DIAGNOSIS — I10 Essential (primary) hypertension: Secondary | ICD-10-CM | POA: Diagnosis not present

## 2018-02-25 NOTE — Progress Notes (Signed)
BP 107/72   Pulse 96   Temp 98.4 F (36.9 C) (Oral)   SpO2 99%    Subjective:    Patient ID: Chase [name], male    DOB: [birth year], 67 y.o.   MRN: [id number]  HPI: Kamerin Grumbine is a 67 y.o. male presenting on 02/24/2018 for No chief complaint on file.  This patient comes in emergently to our office 6:00 hours by his wife and daughter and granddaughters.  We were open as an urgent care when he came without a call.  He was complaining of pain and shortness of breath.  He is 1 week out from significant lumbar surgery by Dr. Christella Noa.  He has had a significant amount of pain it seems during this week.  His granddaughter served as Engineer, materials.  He did go to the emergency room 3 days ago with chest pain.  He did have CT of chest and abdomen and was ruled out of infection and PE.  He did have a significant amount of discomfort whenever he was moving in our office.  After further investigation it seems that his pain is the primary calls for him to get short of breath and very uncomfortable.  He states he has had some nausea and vomiting.  He cannot decide if it is directly related to the oxycodone.  He does have vomiting.  When he vomits it makes his back hurt more.  So at this time we are going to change his pain medicine and muscle relaxant.  We will let Dr. Lacy Duverney office know tomorrow.  EKG and vitals were all good in our office Minimal labs were ordered but will take 1 day to return, pickup had already occurred. No x-rays available at that time. All hospital records from ER visit on 9/21 have been reviewed  Point of contact is his daughter Ezzard Flax phone number is [phone].  He was supposed to have a follow-up in 3 weeks from the surgery.  That still about 2 weeks out.  I think he should probably be seen a little bit shorter basis or someone can do some phone triage with him.   Past Medical History:  Diagnosis Date  . Arthritis   . Cancer [facility])    prostate 2017    . Depression   . Diabetes mellitus without complication ([place])   . Headache   . High cholesterol   . Hypertension   . Inguinal hernia    rt  . Neck pain   . Numbness and tingling in hands   . Urinary frequency    Relevant past medical, surgical, family and social history reviewed and updated as indicated. Interim medical history since our last visit reviewed. Allergies and medications reviewed and updated. DATA REVIEWED: CHART IN EPIC  Family History reviewed for pertinent findings.  Review of Systems  Constitutional: Positive for diaphoresis. Negative for appetite change, fatigue and fever.  HENT: Negative.   Eyes: Negative.  Negative for pain and visual disturbance.  Respiratory: Positive for shortness of breath. Negative for cough, chest tightness and wheezing.   Cardiovascular: Positive for chest pain. Negative for palpitations and leg swelling.  Gastrointestinal: Positive for nausea and vomiting. Negative for abdominal pain and diarrhea.  Endocrine: Negative.   Genitourinary: Negative.   Musculoskeletal: Positive for arthralgias, back pain and gait problem.  Skin: Negative.  Negative for color change and rash.  Neurological: Negative for weakness, numbness and headaches.  Psychiatric/Behavioral: Negative.     Allergies as of 02/24/2018  No Known Allergies     Medication List        Accurate as of 02/24/18 11:59 PM. Always use your most recent med list.          albuterol 108 (90 Base) MCG/ACT inhaler Commonly known as:  PROVENTIL HFA;VENTOLIN HFA Inhale 2 puffs into the lungs every 6 (six) hours.   atorvastatin 40 MG tablet Commonly known as:  LIPITOR Take 40 mg by mouth every evening.   docusate sodium 100 MG capsule Commonly known as:  COLACE Take 1 capsule (100 mg total) by mouth every 12 (twelve) hours.   FLOVENT HFA 110 MCG/ACT inhaler Generic drug:  fluticasone Inhale 2 puffs into the lungs 2 (two) times daily.   glyBURIDE-metformin 5-500 MG  tablet Commonly known as:  GLUCOVANCE Take 1-2 tablets by mouth See admin instructions. Take 2 tablets by mouth in the morning and 1 in the evening.   HYDROcodone-acetaminophen 10-325 MG tablet Commonly known as:  NORCO Take 1 tablet by mouth every 4 (four) hours as needed. Allergic oxycodone   lisinopril 5 MG tablet Commonly known as:  PRINIVIL,ZESTRIL Take 5 mg by mouth daily.   methocarbamol 500 MG tablet Commonly known as:  ROBAXIN Take 1 tablet (500 mg total) by mouth every 6 (six) hours as needed for muscle spasms.   oxyCODONE 5 MG immediate release tablet Commonly known as:  Oxy IR/ROXICODONE Take 1 tablet (5 mg total) by mouth every 6 (six) hours as needed for severe pain.   polyethylene glycol packet Commonly known as:  MIRALAX / GLYCOLAX Take 17 g by mouth daily.   tamsulosin 0.4 MG Caps capsule Commonly known as:  FLOMAX Take 0.4 mg by mouth every evening.   tiZANidine 4 MG tablet Commonly known as:  ZANAFLEX Take 1 tablet (4 mg total) by mouth every 6 (six) hours as needed for muscle spasms.          Objective:    BP 107/72   Pulse 96   Temp 98.4 F (36.9 C) (Oral)   SpO2 99%   No Known Allergies  Wt Readings from Last 3 Encounters:  02/21/18 150 lb (68 kg)  02/17/18 155 lb 8 oz (70.5 kg)  02/12/18 155 lb 8 oz (70.5 kg)    Physical Exam  Constitutional: He appears well-developed and well-nourished. He appears distressed.  HENT:  Head: Normocephalic and atraumatic.  Eyes: Pupils are equal, round, and reactive to light. Conjunctivae and EOM are normal.  Neck: Normal range of motion. Neck supple.  Cardiovascular: Normal rate, regular rhythm and normal heart sounds.  Pulmonary/Chest: Effort normal and breath sounds normal. No respiratory distress. He has no wheezes.  Abdominal: Soft. Bowel sounds are normal.  Musculoskeletal:       Lumbar back: He exhibits decreased range of motion, tenderness, pain and spasm.       Back:  Skin: Skin is warm. He  is diaphoretic.    Results for orders placed or performed during the hospital encounter of 02/21/18  CBC with Differential  Result Value Ref Range   WBC 8.0 4.0 - 10.5 K/uL   RBC 4.12 (L) 4.22 - 5.81 MIL/uL   Hemoglobin 12.6 (L) 13.0 - 17.0 g/dL   HCT 37.8 (L) 39.0 - 52.0 %   MCV 91.7 78.0 - 100.0 fL   MCH 30.6 26.0 - 34.0 pg   MCHC 33.3 30.0 - 36.0 g/dL   RDW 13.3 11.5 - 15.5 %   Platelets 209 150 - 400 K/uL  Neutrophils Relative % 66 %   Neutro Abs 5.3 1.7 - 7.7 K/uL   Lymphocytes Relative 21 %   Lymphs Abs 1.7 0.7 - 4.0 K/uL   Monocytes Relative 8 %   Monocytes Absolute 0.7 0.1 - 1.0 K/uL   Eosinophils Relative 4 %   Eosinophils Absolute 0.3 0.0 - 0.7 K/uL   Basophils Relative 1 %   Basophils Absolute 0.0 0.0 - 0.1 K/uL  Basic metabolic panel  Result Value Ref Range   Sodium 134 (L) 135 - 145 mmol/L   Potassium 4.1 3.5 - 5.1 mmol/L   Chloride 97 (L) 98 - 111 mmol/L   CO2 25 22 - 32 mmol/L   Glucose, Bld 277 (H) 70 - 99 mg/dL   BUN 14 8 - 23 mg/dL   Creatinine, Ser 0.66 0.61 - 1.24 mg/dL   Calcium 9.5 8.9 - 10.3 mg/dL   GFR calc non Af Amer >60 >60 mL/min   GFR calc Af Amer >60 >60 mL/min   Anion gap 12 5 - 15  Urinalysis, Routine w reflex microscopic  Result Value Ref Range   Color, Urine YELLOW YELLOW   APPearance CLEAR CLEAR   Specific Gravity, Urine 1.014 1.005 - 1.030   pH 7.0 5.0 - 8.0   Glucose, UA >=500 (A) NEGATIVE mg/dL   Hgb urine dipstick NEGATIVE NEGATIVE   Bilirubin Urine NEGATIVE NEGATIVE   Ketones, ur NEGATIVE NEGATIVE mg/dL   Protein, ur NEGATIVE NEGATIVE mg/dL   Nitrite NEGATIVE NEGATIVE   Leukocytes, UA NEGATIVE NEGATIVE   RBC / HPF 0-5 0 - 5 RBC/hpf   WBC, UA 0-5 0 - 5 WBC/hpf   Bacteria, UA NONE SEEN NONE SEEN   Squamous Epithelial / LPF 0-5 0 - 5      Assessment & Plan:   1. Shortness of breath - EKG 12-Lead - CMP14+EGFR - CBC with Differential/Platelet  2. Chest pain, unspecified type - CMP14+EGFR - CBC with  Differential/Platelet  3. HNP (herniated nucleus pulposus) with myelopathy, cervical - ketorolac (TORADOL) injection 60 mg - promethazine (PHENERGAN) injection 25 mg Stop oxycodone and Flexeril Start hydrocodone and Robaxin  4. Spondylolisthesis of lumbar region - ketorolac (TORADOL) injection 60 mg - promethazine (PHENERGAN) injection 25 mg Stop oxycodone and Flexeril Start hydrocodone and Robaxin  Continue all other maintenance medications as listed above.  Follow up plan: Return in about 1 week (around 03/03/2018) for recheck with [facility]. Go to emergency room if sudden change in his well-being.  Educational handout given for [place] PA-C [place] [address]  [place], [place] [address] [phone]   02/25/2018, 10:11 AM

## 2018-02-26 LAB — CBC WITH DIFFERENTIAL/PLATELET
Basophils Absolute: 0.1 10*3/uL (ref 0.0–0.2)
Basos: 1 %
EOS (ABSOLUTE): 0.5 10*3/uL — ABNORMAL HIGH (ref 0.0–0.4)
Eos: 5 %
Hematocrit: 39.4 % (ref 37.5–51.0)
Hemoglobin: 13.4 g/dL (ref 13.0–17.7)
Immature Grans (Abs): 0.1 10*3/uL (ref 0.0–0.1)
Immature Granulocytes: 1 %
Lymphocytes Absolute: 1.7 10*3/uL (ref 0.7–3.1)
Lymphs: 18 %
MCH: 30.5 pg (ref 26.6–33.0)
MCHC: 34 g/dL (ref 31.5–35.7)
MCV: 90 fL (ref 79–97)
Monocytes Absolute: 0.6 10*3/uL (ref 0.1–0.9)
Monocytes: 6 %
Neutrophils Absolute: 6.6 10*3/uL (ref 1.4–7.0)
Neutrophils: 69 %
Platelets: 289 10*3/uL (ref 150–450)
RBC: 4.39 x10E6/uL (ref 4.14–5.80)
RDW: 12.9 % (ref 12.3–15.4)
WBC: 9.6 10*3/uL (ref 3.4–10.8)

## 2018-02-26 LAB — CMP14+EGFR
ALT: 38 IU/L (ref 0–44)
AST: 18 IU/L (ref 0–40)
Albumin/Globulin Ratio: 2 (ref 1.2–2.2)
Albumin: 4.5 g/dL (ref 3.6–4.8)
Alkaline Phosphatase: 76 IU/L (ref 39–117)
BUN/Creatinine Ratio: 17 (ref 10–24)
BUN: 13 mg/dL (ref 8–27)
Bilirubin Total: 0.5 mg/dL (ref 0.0–1.2)
CO2: 17 mmol/L — ABNORMAL LOW (ref 20–29)
Calcium: 9.7 mg/dL (ref 8.6–10.2)
Chloride: 95 mmol/L — ABNORMAL LOW (ref 96–106)
Creatinine, Ser: 0.78 mg/dL (ref 0.76–1.27)
GFR calc Af Amer: 110 mL/min/{1.73_m2} (ref >59)
GFR calc non Af Amer: 95 mL/min/{1.73_m2} (ref >59)
Globulin, Total: 2.2 g/dL (ref 1.5–4.5)
Glucose: 201 mg/dL — ABNORMAL HIGH (ref 65–99)
Potassium: 4.5 mmol/L (ref 3.5–5.2)
Sodium: 136 mmol/L (ref 134–144)
Total Protein: 6.7 g/dL (ref 6.0–8.5)

## 2018-03-01 DIAGNOSIS — M48062 Spinal stenosis, lumbar region with neurogenic claudication: Secondary | ICD-10-CM | POA: Diagnosis not present

## 2018-03-01 DIAGNOSIS — E119 Type 2 diabetes mellitus without complications: Secondary | ICD-10-CM | POA: Diagnosis not present

## 2018-03-01 DIAGNOSIS — I10 Essential (primary) hypertension: Secondary | ICD-10-CM | POA: Diagnosis not present

## 2018-03-01 DIAGNOSIS — M4316 Spondylolisthesis, lumbar region: Secondary | ICD-10-CM | POA: Diagnosis not present

## 2018-03-01 DIAGNOSIS — Z4789 Encounter for other orthopedic aftercare: Secondary | ICD-10-CM | POA: Diagnosis not present

## 2018-03-01 DIAGNOSIS — F17210 Nicotine dependence, cigarettes, uncomplicated: Secondary | ICD-10-CM | POA: Diagnosis not present

## 2018-03-02 ENCOUNTER — Encounter (HOSPITAL_COMMUNITY): Payer: Self-pay | Admitting: Emergency Medicine

## 2018-03-02 ENCOUNTER — Emergency Department (HOSPITAL_COMMUNITY): Payer: Medicare Other

## 2018-03-02 ENCOUNTER — Other Ambulatory Visit: Payer: Self-pay

## 2018-03-02 ENCOUNTER — Emergency Department (HOSPITAL_COMMUNITY)
Admission: EM | Admit: 2018-03-02 | Discharge: 2018-03-03 | Disposition: A | Discharge status: Home / Self Care | Payer: Medicare Other | Attending: Emergency Medicine | Admitting: Emergency Medicine

## 2018-03-02 ENCOUNTER — Ambulatory Visit: Payer: Medicare Other | Admitting: Family Medicine

## 2018-03-02 DIAGNOSIS — Z79899 Other long term (current) drug therapy: Secondary | ICD-10-CM | POA: Insufficient documentation

## 2018-03-02 DIAGNOSIS — R0789 Other chest pain: Secondary | ICD-10-CM | POA: Diagnosis not present

## 2018-03-02 DIAGNOSIS — R112 Nausea with vomiting, unspecified: Secondary | ICD-10-CM | POA: Diagnosis not present

## 2018-03-02 DIAGNOSIS — E119 Type 2 diabetes mellitus without complications: Secondary | ICD-10-CM | POA: Insufficient documentation

## 2018-03-02 DIAGNOSIS — Z7984 Long term (current) use of oral hypoglycemic drugs: Secondary | ICD-10-CM | POA: Diagnosis not present

## 2018-03-02 DIAGNOSIS — I10 Essential (primary) hypertension: Secondary | ICD-10-CM | POA: Diagnosis not present

## 2018-03-02 DIAGNOSIS — F17210 Nicotine dependence, cigarettes, uncomplicated: Secondary | ICD-10-CM | POA: Insufficient documentation

## 2018-03-02 DIAGNOSIS — R0602 Shortness of breath: Secondary | ICD-10-CM | POA: Diagnosis not present

## 2018-03-02 DIAGNOSIS — R109 Unspecified abdominal pain: Secondary | ICD-10-CM | POA: Diagnosis not present

## 2018-03-02 DIAGNOSIS — M48061 Spinal stenosis, lumbar region without neurogenic claudication: Secondary | ICD-10-CM | POA: Diagnosis not present

## 2018-03-02 DIAGNOSIS — M5126 Other intervertebral disc displacement, lumbar region: Secondary | ICD-10-CM | POA: Diagnosis not present

## 2018-03-02 DIAGNOSIS — Z8546 Personal history of malignant neoplasm of prostate: Secondary | ICD-10-CM | POA: Diagnosis not present

## 2018-03-02 LAB — BASIC METABOLIC PANEL
Anion gap: 13 (ref 5–15)
BUN: 9 mg/dL (ref 8–23)
CO2: 21 mmol/L — ABNORMAL LOW (ref 22–32)
Calcium: 9.7 mg/dL (ref 8.9–10.3)
Chloride: 100 mmol/L (ref 98–111)
Creatinine, Ser: 0.73 mg/dL (ref 0.61–1.24)
GFR calc Af Amer: >60 mL/min (ref >60)
GFR calc non Af Amer: >60 mL/min (ref >60)
Glucose, Bld: 201 mg/dL — ABNORMAL HIGH (ref 70–99)
Potassium: 4 mmol/L (ref 3.5–5.1)
Sodium: 134 mmol/L — ABNORMAL LOW (ref 135–145)

## 2018-03-02 LAB — CBC
HCT: 41.6 % (ref 39.0–52.0)
Hemoglobin: 13.6 g/dL (ref 13.0–17.0)
MCH: 29.7 pg (ref 26.0–34.0)
MCHC: 32.7 g/dL (ref 30.0–36.0)
MCV: 90.8 fL (ref 78.0–100.0)
Platelets: 303 10*3/uL (ref 150–400)
RBC: 4.58 MIL/uL (ref 4.22–5.81)
RDW: 12.7 % (ref 11.5–15.5)
WBC: 7.2 10*3/uL (ref 4.0–10.5)

## 2018-03-02 LAB — HEPATIC FUNCTION PANEL
ALT: 32 U/L (ref 0–44)
AST: 25 U/L (ref 15–41)
Albumin: 4.3 g/dL (ref 3.5–5.0)
Alkaline Phosphatase: 114 U/L (ref 38–126)
Bilirubin, Direct: 0.1 mg/dL (ref 0.0–0.2)
Indirect Bilirubin: 0.6 mg/dL (ref 0.3–0.9)
Total Bilirubin: 0.7 mg/dL (ref 0.3–1.2)
Total Protein: 7.3 g/dL (ref 6.5–8.1)

## 2018-03-02 LAB — I-STAT TROPONIN, ED: Troponin i, poc: 0 ng/mL (ref 0.00–0.08)

## 2018-03-02 LAB — LIPASE, BLOOD: Lipase: 33 U/L (ref 11–51)

## 2018-03-02 MED ORDER — ONDANSETRON HCL 4 MG/2ML IJ SOLN
4.0000 mg | Freq: Once | INTRAMUSCULAR | Status: AC
  Administered 2018-03-03: 4 mg via INTRAVENOUS
  Filled 2018-03-02: qty 2

## 2018-03-02 MED ORDER — GADOBUTROL 1 MMOL/ML IV SOLN
7.5000 mL | Freq: Once | INTRAVENOUS | Status: AC | PRN
  Administered 2018-03-02: 6.5 mL via INTRAVENOUS

## 2018-03-02 MED ORDER — FENTANYL CITRATE (PF) 100 MCG/2ML IJ SOLN
100.0000 ug | Freq: Once | INTRAMUSCULAR | Status: AC
  Administered 2018-03-02: 100 ug via INTRAVENOUS
  Filled 2018-03-02: qty 2

## 2018-03-02 NOTE — ED Provider Notes (Signed)
[facility] EMERGENCY DEPARTMENT Provider Note   CSN: [id number] Arrival date & time: 03/02/18  1830     History   Chief Complaint Chief Complaint  Patient presents with  . Shortness of Breath    HPI Chase [name] is a 67 y.o. male.  HPI Patient presents for abdominal and back pain.  Around 2 weeks ago had lumbar surgery by Dr. Christella Noa.  Since then has had some abdominal back pain.  States now the pain is in his left lower back and radiates down to his leg.  Also has had nausea and vomiting worse with eating.  Has no chest pain but has had some shortness of breath at times.  Reviewing records has been seen in the ER and by PCP for similar symptoms.  No swelling of his legs. Past Medical History:  Diagnosis Date  . Arthritis   . Cancer [facility])    prostate 2017  . Depression   . Diabetes mellitus without complication ([place])   . Headache   . High cholesterol   . Hypertension   . Inguinal hernia    rt  . Neck pain   . Numbness and tingling in hands   . Urinary frequency     Patient Active Problem List   Diagnosis Date Noted  . Spondylolisthesis of lumbar region 02/17/2018  . Osteoarthritis of spine with radiculopathy, lumbar region 08/01/2015  . HNP (herniated nucleus pulposus) with myelopathy, cervical 04/04/2015    Past Surgical History:  Procedure Laterality Date  . ANTERIOR CERVICAL DECOMP/DISCECTOMY FUSION N/A 04/04/2015   Procedure: ANTERIOR CERVICAL DECOMPRESSION/DISCECTOMY FUSION PLATING BONEGRAFT CERVICAL FOUR-FIVE CERVICAL FIVE-SIX;  Surgeon: Ashok Pall, MD;  Location: [facility] NEURO ORS;  Service: Neurosurgery;  Laterality: N/A;  . BACK SURGERY     lower back with Dr. Christella Noa  . SHOULDER ARTHROSCOPY Right   . UPPER EXTREMITY VENOGRAPHY N/A 07/22/2016   Procedure: Upper Extremity Venography - bilateral arm;  Surgeon: Serafina Mitchell, MD;  Location: [facility] CV LAB;  Service: Cardiovascular;  Laterality: N/A;        Home Medications     Prior to Admission medications   Medication Sig Start Date End Date Taking? Authorizing Provider  albuterol (PROVENTIL HFA;VENTOLIN HFA) 108 (90 Base) MCG/ACT inhaler Inhale 2 puffs into the lungs every 6 (six) hours. 12/19/17  Yes [provider]  atorvastatin (LIPITOR) 40 MG tablet Take 40 mg by mouth every evening.    Yes [provider]  HYDROcodone-acetaminophen (NORCO) 10-325 MG tablet Take 1 tablet by mouth every 4 (four) hours as needed. Allergic oxycodone 02/24/18  Yes Terald Sleeper, PA-C  lisinopril (PRINIVIL,ZESTRIL) 5 MG tablet Take 5 mg by mouth daily.   Yes [provider]  metFORMIN (GLUCOPHAGE) 500 MG tablet Take 500-1,000 mg by mouth See admin instructions. 1,000mg  in the morning and 500mg  in the evening   Yes [provider]  methocarbamol (ROBAXIN) 500 MG tablet Take 1 tablet (500 mg total) by mouth every 6 (six) hours as needed for muscle spasms. 02/24/18  Yes Terald Sleeper, PA-C  polyethylene glycol [facility]) packet Take 17 g by mouth daily. 12/22/17  Yes Isla Pence, MD  tamsulosin (FLOMAX) 0.4 MG CAPS capsule Take 0.4 mg by mouth every evening.   Yes [provider]  docusate sodium (COLACE) 100 MG capsule Take 1 capsule (100 mg total) by mouth every 12 (twelve) hours. Patient not taking: Reported on 03/02/2018 12/22/17   Isla Pence, MD  ondansetron (ZOFRAN-ODT) 4 MG  disintegrating tablet Take 1 tablet (4 mg total) by mouth every 8 (eight) hours as needed for nausea or vomiting. 03/03/18   Davonna Belling, MD  oxyCODONE (ROXICODONE) 5 MG immediate release tablet Take 1 tablet (5 mg total) by mouth every 6 (six) hours as needed for severe pain. Patient not taking: Reported on 03/02/2018 02/19/18   Ashok Pall, MD  tiZANidine (ZANAFLEX) 4 MG tablet Take 1 tablet (4 mg total) by mouth every 6 (six) hours as needed for muscle spasms. Patient not taking: Reported on 03/02/2018 02/19/18   Ashok Pall, MD    Family History No  family history on file.  Social History Social History   Tobacco Use  . Smoking status: Current Every Day Smoker    Packs/day: 0.25    Years: 40.00    Pack years: 10.00    Types: Cigarettes  . Smokeless tobacco: Never Used  . Tobacco comment: quit 1.5 months ago  Substance Use Topics  . Alcohol use: Not Currently    Comment: occassionally  . Drug use: No     Allergies   Patient has no known allergies.   Review of Systems Review of Systems  Constitutional: Positive for appetite change.  HENT: Negative for congestion.   Respiratory: Positive for shortness of breath.   Cardiovascular: Negative for chest pain.  Gastrointestinal: Positive for abdominal pain, constipation, nausea and vomiting.  Genitourinary: Positive for flank pain.  Musculoskeletal: Positive for back pain.  Skin: Negative for rash.  Neurological: Negative for seizures and weakness.  Psychiatric/Behavioral: Negative for confusion.     Physical Exam Updated Vital Signs BP 140/79   Pulse 73   Resp 18   SpO2 99%   Physical Exam  Constitutional: He appears well-developed.  HENT:  Head: Normocephalic.  Eyes: EOM are normal.  Cardiovascular: Regular rhythm.  Pulmonary/Chest: Effort normal and breath sounds normal.  Abdominal:  Mild left-sided tenderness without rebound or guarding.  Musculoskeletal:       Right lower leg: He exhibits no edema.       Left lower leg: He exhibits no edema.  Healing wound of his lumbar spine.  Neurological: He is alert.  Skin: Capillary refill takes less than 2 seconds.     ED Treatments / Results  Labs (all labs ordered are listed, but only abnormal results are displayed) Labs Reviewed  BASIC METABOLIC PANEL - Abnormal; Notable for the following components:      Result Value   Sodium 134 (*)    CO2 21 (*)    Glucose, Bld 201 (*)    All other components within normal limits  CBC  HEPATIC FUNCTION PANEL  LIPASE, BLOOD  I-STAT TROPONIN, ED    EKG EKG  Interpretation  Date/Time:  Tuesday March 02 2018 19:54:58 EDT Ventricular Rate:  68 PR Interval:    QRS Duration: 98 QT Interval:  397 QTC Calculation: 423 R Axis:   84 Text Interpretation:  Sinus rhythm Borderline right axis deviation Confirmed by Davonna Belling [phone]) on 03/02/2018 8:23:13 PM   Radiology Dg Chest 2 View  Result Date: 03/02/2018 CLINICAL DATA:  Shortness of breath, right chest pain EXAM: CHEST - 2 VIEW COMPARISON:  CT chest dated 02/21/2018 FINDINGS: Lungs are clear.  No pleural effusion or pneumothorax. The heart is normal in size. Cervical spine fixation hardware. IMPRESSION: No evidence of acute cardiopulmonary disease. Electronically Signed   By: Julian Hy M.D.   On: 03/02/2018 19:50   Mr Lumbar Spine W Wo Contrast  Result  Date: 03/02/2018 CLINICAL DATA:  67 y/o M; recent back surgery. Left leg tenderness. EXAM: MRI LUMBAR SPINE WITHOUT AND WITH CONTRAST TECHNIQUE: Multiplanar and multiecho pulse sequences of the lumbar spine were obtained without and with intravenous contrast. CONTRAST:  6.5 cc Gadavist. COMPARISON:  02/21/2018 CT abdomen and pelvis. 12/15/2017 lumbar spine MRI. FINDINGS: Segmentation:  Standard. Alignment:  Physiologic. Vertebrae: L2-L5 posterior and interbody fusion with laminectomy and multilevel partial facetectomy. Mild L2-3 opposing endplate edema is likely related to recent surgical intervention. No bony destructive changes or psoas edema/abscess. Conus medullaris and cauda equina: Conus extends to the L1-2 level. Conus and cauda equina appear normal. Paraspinal and other soft tissues: There is a fluid collection within the subcutaneous fat extending from L1-L4 measuring 12 x 22 x 69 mm (AP x ML x CC series 11, image 12 and series 12, image 9). There is an additional fluid collection within the paraspinal muscles extending into the L2-3 laminectomy bed and surrounding the right-sided facet measuring up to 33 x 13 mm in the axial plane  (series 11, image 16 and series 12, image 8). There is surrounding edema and enhancement within the posterior soft tissues. Bilateral nonenhancing renal cysts. Disc levels: L1-2: Stable right central disc extrusion with superior migration resulting in stenosis of the right lateral recess and mild canal stenosis. No foraminal stenosis. L2-3: Interval posterior fusion, interbody fusion, and laminectomy. Decreased size of central disc extrusion. Improved patency of the spinal canal with moderate residual spinal canal stenosis. Improved mild bilateral foraminal stenosis. L3-4: Stable posterior and interbody fusion. No significant foraminal or canal stenosis. L4-5: Stable posterior and interbody fusion. Right-sided endplate marginal osteophytes and facet hypertrophy results in stable moderate right-sided foraminal stenosis. No significant canal or left-sided foraminal stenosis. L5-S1: No significant disc displacement. Prominent bilateral facet hypertrophy. No significant foraminal or canal stenosis. IMPRESSION: 1. Interval L2-3 posterior posterior interbody fusion as well as laminectomy. Fluid collections are present within the subcutaneous fat, paraspinal muscles, and laminectomy bed with surrounding edema. Findings are probably related to recent postoperative changes. If infection is suspected clinically, consider fluid sampling. 2. Decreased size of L2-3 central disc extrusion with improved patency of the spinal canal. Residual moderate L2-3 spinal canal stenosis. 3. Stable mild L1-2 canal stenosis from right central disc extrusion. 4. Stable moderate right L4-5 foraminal stenosis and multilevel mild foraminal stenosis. Electronically Signed   By: Kristine Garbe M.D.   On: 03/02/2018 23:37   Dg Abd 2 Views  Result Date: 03/02/2018 CLINICAL DATA:  Abdominal pain, decreased appetite, nausea and vomiting EXAM: ABDOMEN - 2 VIEW COMPARISON:  02/21/2018, 12/22/2017 FINDINGS: Clear lung bases. Normal heart  size. No significant free air. Scattered air and stool throughout the bowel. No significant obstruction pattern, ileus or dilatation. No abnormal calcifications. Fixation hardware of the lumbar spine. IMPRESSION: Negative for obstruction or free air. No acute finding by plain radiography Electronically Signed   By: Jerilynn Mages.  Shick M.D.   On: 03/02/2018 22:37    Procedures Procedures (including critical care time)  Medications Ordered in ED Medications  fentaNYL (SUBLIMAZE) injection 100 mcg (100 mcg Intravenous Given 03/02/18 2151)  gadobutrol (GADAVIST) 1 MMOL/ML injection 7.5 mL (6.5 mLs Intravenous Contrast Given 03/02/18 2302)  ondansetron (ZOFRAN) injection 4 mg (4 mg Intravenous Given 03/03/18 0004)     Initial Impression / Assessment and Plan / ED Course  I have reviewed the triage vital signs and the nursing notes.  Pertinent labs & imaging results that were available during my care  of the patient were reviewed by me and considered in my medical decision making (see chart for details).    Patient with nausea vomiting abdominal pain chest pain and some shortness of breath.  Has been worked up for same in the ER with CT scan of his abdomen and CTA of his chest.  That was a few days ago.  I do not think these need to be repeated but x-rays of both sites are reassuring.  However MRI done of the spine due to back pain that does seem to radiate to the left hip area.  Some fluid collections but likely what would be expected postsurgical.  Does not have a fever and has normal white count.  That could be a component of the nausea coming from his medications.  Has had change in his pain medicines but still had similar symptoms.  Notes superior to see that his PCP wanted to see him tomorrow.  I have added Zofran.  Also would probably benefit from discussing with Dr. Christella Noa so we can review the MRI.  Discharge home.  Final Clinical Impressions(s) / ED Diagnoses   Final diagnoses:  Abdominal pain    Abdominal pain, unspecified abdominal location  Nausea and vomiting, intractability of vomiting not specified, unspecified vomiting type    ED Discharge Orders         Ordered    ondansetron (ZOFRAN-ODT) 4 MG disintegrating tablet  Every 8 hours PRN     03/03/18 Cruz Condon, MD 03/03/18 0030

## 2018-03-02 NOTE — ED Notes (Signed)
Patient transported to MRI/CT.

## 2018-03-02 NOTE — ED Notes (Addendum)
Patient ambulated with pulse ox. O2 sats maintained at 98%.   Patient sitting on the side of the bed for comfort.

## 2018-03-02 NOTE — ED Triage Notes (Signed)
Patient c/o shortness of breath onset of 5-6 this am. Pt had recent back surgery. Pt states he's had some left leg tenderness. Pain worse with inspiration.

## 2018-03-02 NOTE — ED Notes (Signed)
Patient sitting on the side of the bed for comfort.

## 2018-03-02 NOTE — ED Notes (Signed)
Patient transported to X-ray 

## 2018-03-03 ENCOUNTER — Telehealth: Payer: Self-pay | Admitting: Family Medicine

## 2018-03-03 DIAGNOSIS — R0602 Shortness of breath: Secondary | ICD-10-CM | POA: Diagnosis not present

## 2018-03-03 MED ORDER — ONDANSETRON 4 MG PO TBDP: 4.0000 mg | ORAL_TABLET | Freq: Three times a day (TID) | ORAL | 0 refills | Status: DC | PRN

## 2018-03-03 NOTE — Telephone Encounter (Signed)
Spoke directly with Dr. Lacy Duverney office and they are to have an appointment with the patient on 03/09/2018 1115 and we will call the patient's family and inform them of that.  We also have a message into them to see if they could possibly see him sooner because of the significant amount of pain that he is been fighting. Caryl Pina, MD [facility] 03/03/2018, 11:41 AM

## 2018-03-03 NOTE — ED Notes (Signed)
Patient verbalizes understanding of medications and discharge instructions. No further questions at this time. VSS and patient ambulatory at discharge.   

## 2018-03-05 DIAGNOSIS — Z4789 Encounter for other orthopedic aftercare: Secondary | ICD-10-CM | POA: Diagnosis not present

## 2018-03-05 DIAGNOSIS — E119 Type 2 diabetes mellitus without complications: Secondary | ICD-10-CM | POA: Diagnosis not present

## 2018-03-05 DIAGNOSIS — I10 Essential (primary) hypertension: Secondary | ICD-10-CM | POA: Diagnosis not present

## 2018-03-05 DIAGNOSIS — M4316 Spondylolisthesis, lumbar region: Secondary | ICD-10-CM | POA: Diagnosis not present

## 2018-03-05 DIAGNOSIS — M48062 Spinal stenosis, lumbar region with neurogenic claudication: Secondary | ICD-10-CM | POA: Diagnosis not present

## 2018-03-05 DIAGNOSIS — F17210 Nicotine dependence, cigarettes, uncomplicated: Secondary | ICD-10-CM | POA: Diagnosis not present

## 2018-03-09 DIAGNOSIS — I10 Essential (primary) hypertension: Secondary | ICD-10-CM | POA: Diagnosis not present

## 2018-03-09 DIAGNOSIS — Z6826 Body mass index (BMI) 26.0-26.9, adult: Secondary | ICD-10-CM | POA: Diagnosis not present

## 2018-03-09 DIAGNOSIS — M4316 Spondylolisthesis, lumbar region: Secondary | ICD-10-CM | POA: Diagnosis not present

## 2018-03-09 NOTE — Op Note (Signed)
02/17/2018  5:52 PM  PATIENT:  Chase [name]  67 y.o. 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 Given his unrelenting pain I have recommended and he has agreed for operative decompression and arthrodesis.  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  BLOOD ADMINISTERED:none  CELL SAVER GIVEN:none  COUNT:per nursing  DRAINS: none   SPECIMEN:  No Specimen  DICTATION: Chase [name] is a 67 y.o. male whom was taken to the operating room intubated, and placed under a general anesthetic without difficulty. A foley catheter was placed under sterile conditions. He was positioned prone on a Jackson stable with all pressure points properly padded.  His lumbar region was prepped and draped in a sterile manner. I infiltrated 10cc's 1/2%lidocaine/1:2000,000 strength epinephrine into the planned incision. I opened the skin with a 10 blade and took the incision down to the thoracolumbar fascia. I exposed the lamina of L1,2,and the hardware at L3 in a subperiosteal fashion bilaterally. I confirmed my location with an intraoperative xray.  I placed self retaining retractors and started the decompression.  I decompressed the spinal canal at L2/3 via a complete laminectomy of L2, and L2 inferior facetectomies bilaterally. I unroofed the foramen and decompressed the nerve in its course through the lateral recess and foramen. The thecal sac was decompressed via the laminectomy. The decompression was well beyond the lateral exposure  needed to perform a PLIF at L2/3. I then started the PLIF. PLIF's were performed at L2/3 in the same fashion. I opened the disc space with a 15 blade then used a variety of instruments to remove the disc and prepare the space for the arthrodesis. I used curettes, rongeurs, punches, shavers for the disc space, and rasps in the discetomy. I measured the disc space and placed two titanium cages(Synthes) into the disc space(s).  I placed pedicle screws at L2, using fluoroscopic guidance. I drilled a pilot hole, then cannulated the pedicle with drill  at each site. I then tapped each pedicle, assessing each site for pedicle violations. No cutouts were appreciated. Screws Harlin Heys) were then placed at each site without difficulty. I attached rods and locking caps with the appropriate tools. The locking caps were secured with torque limited screwdrivers. Final films were performed and the final construct appeared to be in good position. I used connectors to attach the L2 screws to the previous construct from L3 to L5. I closed the wound in a layered fashion. I approximated the thoracolumbar fascia, subcutaneous, and subcuticular planes with vicryl sutures. I used dermabond and an occlusive bandage for a sterile dressing.     PLAN OF CARE: Admit to inpatient   PATIENT DISPOSITION:  PACU - hemodynamically stable.   Delay start of Pharmacological VTE agent (>24hrs) due to surgical blood loss or risk of bleeding:  yes

## 2018-03-11 ENCOUNTER — Ambulatory Visit: Payer: Medicare Other | Admitting: Family Medicine

## 2018-03-11 ENCOUNTER — Encounter: Payer: Self-pay | Admitting: Family Medicine

## 2018-03-11 ENCOUNTER — Ambulatory Visit (INDEPENDENT_AMBULATORY_CARE_PROVIDER_SITE_OTHER): Payer: Medicare Other | Admitting: Family Medicine

## 2018-03-11 VITALS — BP 113/80 | HR 89 | Temp 97.2°F

## 2018-03-11 DIAGNOSIS — F410 Panic disorder [episodic paroxysmal anxiety] without agoraphobia: Secondary | ICD-10-CM | POA: Diagnosis not present

## 2018-03-11 DIAGNOSIS — F411 Generalized anxiety disorder: Secondary | ICD-10-CM

## 2018-03-11 MED ORDER — HYDROXYZINE HCL 10 MG PO TABS: 10.0000 mg | ORAL_TABLET | Freq: Three times a day (TID) | ORAL | 0 refills | Status: DC | PRN

## 2018-03-11 MED ORDER — DULOXETINE HCL 30 MG PO CPEP: 30.0000 mg | ORAL_CAPSULE | Freq: Every day | ORAL | 1 refills | Status: DC

## 2018-03-11 NOTE — Progress Notes (Signed)
BP 113/80   Pulse 89   Temp (!) 97.2 F (36.2 C) (Oral)   SpO2 96%    Subjective:    Patient ID: Chase [name], male    DOB: [birth year], 67 y.o.   MRN: [id number]  HPI: Chase [name] is a 67 y.o. male presenting on 03/11/2018 for Hospitalization Follow-up (10/1 [facility] - Patient states he is no better.); Abdominal Pain; Emesis; Nausea; and Shortness of Breath   HPI Hospital follow-up for abdominal pain nausea and emesis and shortness of breath Patient has been having recurring issues since his back surgery which was on 02/17/2017.  He has been in and out of the emergency department on multiple occasions including on 03/02/2018 most recently.  He has been there with abdominal pain and nausea and anxiousness which comes down rather quickly once he gets there.  He is also been getting pain medication but then the pain medication makes him feel ill from his surgeon Dr. Christella Noa.  Patient does admit that these episodes when he has them are associated with him feeling very anxious because he is home all the time and inactive and cannot do the things that he usually does.  He does feel somewhat depressed at times as well and says that they come down quickly once the anxiousness passes.  Depression screen [facility] 2/9 03/11/2018 11/29/2015  Decreased Interest 0 0  Down, Depressed, Hopeless 1 0  PHQ - 2 Score 1 0    Relevant past medical, surgical, family and social history reviewed and updated as indicated. Interim medical history since our last visit reviewed. Allergies and medications reviewed and updated.  Review of Systems  Constitutional: Negative for chills and fever.  Eyes: Negative for visual disturbance.  Respiratory: Positive for chest tightness and shortness of breath. Negative for cough and wheezing.   Cardiovascular: Negative for chest pain and leg swelling.  Gastrointestinal: Positive for abdominal pain, constipation and nausea.  Musculoskeletal: Negative for back pain and gait  problem.  Skin: Negative for rash.  Neurological: Negative for dizziness, weakness, numbness and headaches.  All other systems reviewed and are negative.   Per HPI unless specifically indicated above   Allergies as of 03/11/2018   No Known Allergies     Medication List        Accurate as of 03/11/18 12:08 PM. Always use your most recent med list.          albuterol 108 (90 Base) MCG/ACT inhaler Commonly known as:  PROVENTIL HFA;VENTOLIN HFA Inhale 2 puffs into the lungs every 6 (six) hours.   atorvastatin 40 MG tablet Commonly known as:  LIPITOR Take 40 mg by mouth every evening.   DULoxetine 30 MG capsule Commonly known as:  CYMBALTA Take 1 capsule (30 mg total) by mouth daily.   HYDROcodone-acetaminophen 10-325 MG tablet Commonly known as:  NORCO Take 1 tablet by mouth every 4 (four) hours as needed. Allergic oxycodone   hydrOXYzine 10 MG tablet Commonly known as:  ATARAX/VISTARIL Take 1 tablet (10 mg total) by mouth 3 (three) times daily as needed.   lisinopril 5 MG tablet Commonly known as:  PRINIVIL,ZESTRIL Take 5 mg by mouth daily.   metFORMIN 500 MG tablet Commonly known as:  GLUCOPHAGE Take 500-1,000 mg by mouth See admin instructions. 1,000mg  in the morning and 500mg  in the evening   methocarbamol 500 MG tablet Commonly known as:  ROBAXIN Take 1 tablet (500 mg total) by mouth every 6 (six) hours as needed for muscle spasms.  polyethylene glycol packet Commonly known as:  MIRALAX / GLYCOLAX Take 17 g by mouth daily.   tamsulosin 0.4 MG Caps capsule Commonly known as:  FLOMAX Take 0.4 mg by mouth every evening.          Objective:    BP 113/80   Pulse 89   Temp (!) 97.2 F (36.2 C) (Oral)   SpO2 96%   Wt Readings from Last 3 Encounters:  02/21/18 150 lb (68 kg)  02/17/18 155 lb 8 oz (70.5 kg)  02/12/18 155 lb 8 oz (70.5 kg)    Physical Exam  Constitutional: He is oriented to person, place, and time. He appears well-developed and  well-nourished. No distress.  Eyes: Pupils are equal, round, and reactive to light. Conjunctivae and EOM are normal. No scleral icterus.  Neck: Neck supple. No thyromegaly present.  Cardiovascular: Normal rate, regular rhythm, normal heart sounds and intact distal pulses.  No murmur heard. Pulmonary/Chest: Effort normal and breath sounds normal. No respiratory distress. He has no wheezes.  Abdominal: Soft. Bowel sounds are normal. He exhibits no distension and no mass. There is no tenderness. There is no guarding. No hernia.  Musculoskeletal: Normal range of motion. He exhibits no edema.  Lymphadenopathy:    He has no cervical adenopathy.  Neurological: He is alert and oriented to person, place, and time. Coordination normal.  Skin: Skin is warm and dry. No rash noted. He is not diaphoretic.  Psychiatric: He has a normal mood and affect. His behavior is normal.  Nursing note and vitals reviewed.       Assessment & Plan:   Problem List Items Addressed This Visit    None    Visit Diagnoses    Generalized anxiety disorder with panic attacks    -  Primary   Relevant Medications   DULoxetine (CYMBALTA) 30 MG capsule   hydrOXYzine (ATARAX/VISTARIL) 10 MG tablet      It sounds like patient symptoms are possibly due to anxiety and possible panic attacks because of his situation and not doing well with his back, will give him Cymbalta and hydroxyzine and see how he does with this. Follow up plan: Return in about 3 weeks (around 04/01/2018), or if symptoms worsen or fail to improve, for anxiety.  Counseling provided for all of the vaccine components No orders of the defined types were placed in this encounter.   Caryl Pina, MD [facility] 03/11/2018, 12:08 PM

## 2018-03-23 DIAGNOSIS — C61 Malignant neoplasm of prostate: Secondary | ICD-10-CM | POA: Diagnosis not present

## 2018-03-29 ENCOUNTER — Ambulatory Visit (INDEPENDENT_AMBULATORY_CARE_PROVIDER_SITE_OTHER): Payer: Medicare Other | Admitting: Family Medicine

## 2018-03-29 ENCOUNTER — Encounter: Payer: Self-pay | Admitting: Family Medicine

## 2018-03-29 VITALS — BP 113/77 | HR 76 | Temp 97.8°F | Ht 64.0 in | Wt 145.8 lb

## 2018-03-29 DIAGNOSIS — Z1211 Encounter for screening for malignant neoplasm of colon: Secondary | ICD-10-CM

## 2018-03-29 DIAGNOSIS — F410 Panic disorder [episodic paroxysmal anxiety] without agoraphobia: Secondary | ICD-10-CM

## 2018-03-29 DIAGNOSIS — Z23 Encounter for immunization: Secondary | ICD-10-CM

## 2018-03-29 DIAGNOSIS — Z0000 Encounter for general adult medical examination without abnormal findings: Secondary | ICD-10-CM | POA: Diagnosis not present

## 2018-03-29 DIAGNOSIS — F411 Generalized anxiety disorder: Secondary | ICD-10-CM

## 2018-03-29 MED ORDER — DULOXETINE HCL 30 MG PO CPEP: 30.0000 mg | ORAL_CAPSULE | Freq: Every day | ORAL | 3 refills | Status: DC

## 2018-03-29 MED ORDER — HYDROXYZINE HCL 10 MG PO TABS: 10.0000 mg | ORAL_TABLET | Freq: Three times a day (TID) | ORAL | 2 refills | Status: DC | PRN

## 2018-03-29 NOTE — Progress Notes (Signed)
Subjective:   Chase [name] is a 67 y.o. male who presents for a Welcome to Medicare exam.   Review of Systems: Review of Systems  Constitutional: Negative for chills and fever.  HENT: Negative for ear pain and tinnitus.   Eyes: Negative for blurred vision and pain.  Respiratory: Negative for cough, shortness of breath and wheezing.   Cardiovascular: Negative for chest pain, palpitations and leg swelling.  Gastrointestinal: Negative for abdominal pain, blood in stool, constipation, diarrhea and melena.  Genitourinary: Negative for dysuria and hematuria.  Musculoskeletal: Negative for back pain, joint pain and myalgias.  Skin: Negative for rash.  Neurological: Negative for dizziness, sensory change, focal weakness, weakness and headaches.  Psychiatric/Behavioral: Negative for depression and suicidal ideas.        Objective:    Today's Vitals   03/29/18 1433  BP: 113/77  Pulse: 76  Temp: 97.8 F (36.6 C)  TempSrc: Oral  Weight: 145 lb 12.8 oz (66.1 kg)  Height: 5\' 4"  (1.626 m)  PainSc: 5    Body mass index is 25.03 kg/m.  Medications Outpatient Encounter Medications as of 03/29/2018  Medication Sig  . albuterol (PROVENTIL HFA;VENTOLIN HFA) 108 (90 Base) MCG/ACT inhaler Inhale 2 puffs into the lungs every 6 (six) hours.  Marland Kitchen atorvastatin (LIPITOR) 40 MG tablet Take 40 mg by mouth every evening.   . DULoxetine (CYMBALTA) 30 MG capsule Take 1 capsule (30 mg total) by mouth daily.  Marland Kitchen HYDROcodone-acetaminophen (NORCO) 10-325 MG tablet Take 1 tablet by mouth every 4 (four) hours as needed. Allergic oxycodone  . hydrOXYzine (ATARAX/VISTARIL) 10 MG tablet Take 1 tablet (10 mg total) by mouth 3 (three) times daily as needed.  Marland Kitchen lisinopril (PRINIVIL,ZESTRIL) 5 MG tablet Take 5 mg by mouth daily.  . metFORMIN (GLUCOPHAGE) 500 MG tablet Take 500-1,000 mg by mouth See admin instructions. 1,000mg  in the morning and 500mg  in the evening  . methocarbamol (ROBAXIN) 500 MG tablet  Take 1 tablet (500 mg total) by mouth every 6 (six) hours as needed for muscle spasms.  . polyethylene glycol (MIRALAX) packet Take 17 g by mouth daily.  . tamsulosin (FLOMAX) 0.4 MG CAPS capsule Take 0.4 mg by mouth every evening.   No facility-administered encounter medications on file as of 03/29/2018.      History: Past Medical History:  Diagnosis Date  . Arthritis   . Cancer [facility])    prostate 2017  . Depression   . Diabetes mellitus without complication ([place])   . Headache   . High cholesterol   . Hypertension   . Inguinal hernia    rt  . Neck pain   . Numbness and tingling in hands   . Urinary frequency    Past Surgical History:  Procedure Laterality Date  . ANTERIOR CERVICAL DECOMP/DISCECTOMY FUSION N/A 04/04/2015   Procedure: ANTERIOR CERVICAL DECOMPRESSION/DISCECTOMY FUSION PLATING BONEGRAFT CERVICAL FOUR-FIVE CERVICAL FIVE-SIX;  Surgeon: Ashok Pall, MD;  Location: [place] [facility];  Service: Neurosurgery;  Laterality: N/A;  . BACK SURGERY     lower back with Dr. Christella Noa  . SHOULDER ARTHROSCOPY Right   . UPPER EXTREMITY VENOGRAPHY N/A 07/22/2016   Procedure: Upper Extremity Venography - bilateral arm;  Surgeon: Serafina Mitchell, MD;  Location: [place] [facility];  Service: Cardiovascular;  Laterality: N/A;    History reviewed. No pertinent family history. Social History   Occupational History  . Not on file  Tobacco Use  . Smoking status: Current Every Day Smoker    Packs/day:  0.25    Years: 40.00    Pack years: 10.00    Types: Cigarettes  . Smokeless tobacco: Never Used  . Tobacco comment: quit 1.5 months ago  Substance and Sexual Activity  . Alcohol use: Not Currently    Comment: occassionally  . Drug use: No  . Sexual activity: Not Currently   Tobacco Counseling Ready to quit: Not Answered Counseling given: Not Answered Comment: quit 1.5 months ago   Immunizations and Health Maintenance  There is no immunization history on file for this  patient. Health Maintenance Due  Topic Date Due  . Hepatitis C Screening  Jun 23, 1950  . HIV Screening  10/01/1967  . TETANUS/TDAP  10/01/1971  . COLONOSCOPY  10/01/2002  . PNA vac Low Risk Adult (1 of 2 - PCV13) 09/30/2017    Activities of Daily Living In your present state of health, do you have any difficulty performing the following activities: 03/29/2018 02/17/2018  Hearing? N N  Vision? N Y  Difficulty concentrating or making decisions? N N  Walking or climbing stairs? N N  Dressing or bathing? N Y  Doing errands, shopping? N N  Some recent data might be hidden    Physical Exam  Physical Exam  Constitutional: He is oriented to person, place, and time. He appears well-developed and well-nourished. No distress.  Eyes: Conjunctivae are normal. No scleral icterus.  Neck: Neck supple. No thyromegaly present.  Cardiovascular: Normal rate, regular rhythm, normal heart sounds and intact distal pulses.  No murmur heard. Pulmonary/Chest: Effort normal and breath sounds normal. No respiratory distress. He has no wheezes.  Musculoskeletal: Normal range of motion. He exhibits no edema.  Lymphadenopathy:    He has no cervical adenopathy.  Neurological: He is alert and oriented to person, place, and time. Coordination normal.  Skin: Skin is warm and dry. No rash noted. He is not diaphoretic.  Psychiatric: He has a normal mood and affect. His behavior is normal.  Nursing note and vitals reviewed.  (optional), or other factors deemed appropriate based on the beneficiary's medical and social history and current clinical standards.  Advanced Directives:      Assessment:    This is a routine wellness  examination for this patient .  Vision/Hearing screen No exam data present  Dietary issues and exercise activities discussed:  Current Exercise Habits: The patient does not participate in regular exercise at present, Exercise limited by: orthopedic condition(s)  Goals   None      Depression Screen PHQ 2/9 Scores 03/29/2018 03/29/2018 03/11/2018 11/29/2015  PHQ - 2 Score 0 0 1 0     Fall Risk Fall Risk  03/29/2018  Falls in the past year? Yes  Number falls in past yr: 2 or more  Injury with Fall? No  Risk for fall due to : History of fall(s)    Cognitive Function MMSE - Mini Mental State Exam 03/29/2018  Orientation to time 5  Orientation to Place 5  Registration 3  Attention/ Calculation 5  Recall 3  Language- name 2 objects 2  Language- repeat 1  Language- follow 3 step command 3  Language- read & follow direction 1  Write a sentence 1  Copy design 1  Total score 30        Patient Care Team: Zeek Rostron, Fransisca Kaufmann, MD as PCP - General (Family Medicine) Ashok Pall, MD as Consulting Physician (Neurosurgery)     Plan:     Problem List Items Addressed This Visit    None  Visit Diagnoses    Encounter for Medicare annual wellness exam    -  Primary   Colon cancer screening       Relevant Orders   Ambulatory referral to Gastroenterology       I have personally reviewed and noted the following in the patient's chart:   . Medical and social history . Use of alcohol, tobacco or illicit drugs  . Current medications and supplements . Functional ability and status . Nutritional status . Physical activity . Advanced directives . List of other physicians . Hospitalizations, surgeries, and ER visits in previous 12 months . Vitals . Screenings to include cognitive, depression, and falls . Referrals and appointments  In addition, I have reviewed and discussed with patient certain preventive protocols, quality metrics, and best practice recommendations. A written personalized care plan for preventive services as well as general preventive health recommendations were provided to patient.    Worthy Rancher, MD 03/29/2018

## 2018-03-29 NOTE — Addendum Note (Signed)
Addended by: Wardell Heath on: 03/29/2018 03:32 PM   Modules accepted: Orders

## 2018-03-30 ENCOUNTER — Encounter: Payer: Self-pay | Admitting: Gastroenterology

## 2018-04-02 ENCOUNTER — Ambulatory Visit: Payer: Medicare Other | Admitting: Family Medicine

## 2018-04-06 ENCOUNTER — Ambulatory Visit (INDEPENDENT_AMBULATORY_CARE_PROVIDER_SITE_OTHER): Payer: Medicare Other | Admitting: Family

## 2018-04-06 ENCOUNTER — Encounter: Payer: Self-pay | Admitting: Family

## 2018-04-06 VITALS — BP 123/70 | HR 81 | Temp 97.5°F | Ht 64.0 in | Wt 148.0 lb

## 2018-04-06 DIAGNOSIS — R369 Urethral discharge, unspecified: Secondary | ICD-10-CM | POA: Diagnosis not present

## 2018-04-06 DIAGNOSIS — B3749 Other urogenital candidiasis: Secondary | ICD-10-CM | POA: Diagnosis not present

## 2018-04-06 DIAGNOSIS — R309 Painful micturition, unspecified: Secondary | ICD-10-CM

## 2018-04-06 LAB — URINALYSIS, COMPLETE
Bilirubin, UA: NEGATIVE
Ketones, UA: NEGATIVE
Leukocytes, UA: NEGATIVE
Nitrite, UA: NEGATIVE
Protein, UA: NEGATIVE
Specific Gravity, UA: 1.02 (ref 1.005–1.030)
Urobilinogen, Ur: 1 mg/dL (ref 0.2–1.0)
pH, UA: 5.5 (ref 5.0–7.5)

## 2018-04-06 LAB — MICROSCOPIC EXAMINATION
Bacteria, UA: NONE SEEN
Epithelial Cells (non renal): NONE SEEN /hpf (ref 0–10)
Renal Epithel, UA: NONE SEEN /hpf
WBC, UA: NONE SEEN /hpf (ref 0–5)

## 2018-04-06 MED ORDER — FLUCONAZOLE 150 MG PO TABS: 150.0000 mg | ORAL_TABLET | ORAL | 0 refills | Status: DC | PRN

## 2018-04-06 MED ORDER — NYSTATIN 100000 UNIT/GM EX POWD: Freq: Four times a day (QID) | CUTANEOUS | 0 refills | Status: DC

## 2018-04-06 NOTE — Patient Instructions (Signed)
Infecciones por hongos en la piel (Skin Yeast Infection) La infeccin por hongos en la piel es un trastorno en el que hay un desarrollo excesivo de unos hongos (Candida) que viven normalmente en la piel. Por lo general, este tipo de infeccin ocurre en reas de la piel que estn constantemente clidas y hmedas, como las axilas o la ingle. CAUSAS La causa de la infeccin es un cambio en el equilibrio normal de los hongos y las bacterias que viven en la piel. FACTORES DE RIESGO Es ms probable que esta afeccin se manifieste en:  [employer]  Las [place].  Las mujeres que toman anticonceptivos.  Las personas con diabetes.  Las personas que toman antibiticos.  Las personas que toman corticoides.  Las Landscape architect.  Las personas que tienen su sistema de defensa (inmunitario) debilitado.  Las [employer] de 57QIO. SNTOMAS Los sntomas de esta afeccin incluyen lo siguiente:  Una zona de la piel roja e hinchada.  Bultos en la piel.  Picazn. DIAGNSTICO Esta afeccin se diagnostica mediante la historia clnica y un examen fsico. El mdico puede raspar ligeramente la piel para tomar Truddie Coco y analizarla con un microscopio a fin de Office manager presencia de hongos. TRATAMIENTO Esta afeccin se trata con medicamentos. Los [employer] pueden ser de venta libre o con receta. Estos medicamentos pueden administrarse de la siguiente manera:  Por boca (va oral).  En forma de crema. [place] o aplquese los medicamentos de venta libre y recetados solamente como se lo haya indicado el mdico.  Consuma ms yogur. Esto puede ayudar a Technical brewer de la candidiasis.  Mantenga un peso saludable. Si necesita ayuda para bajar de peso, hable con el mdico.  Mantenga la piel limpia y [place].  Si tiene diabetes, mantenga bajo control el nivel de Dispensing optician. SOLICITE ATENCIN MDICA SI:  Los  sntomas desaparecen y Teacher, adult education.  Los sntomas no mejoran con Dispensing optician.  Los sntomas empeoran.  La erupcin se extiende.  Tiene fiebre o siente escalofros.  Aparecen nuevos sntomas.  Tiene una nueva zona de enrojecimiento o calor en la piel. Esta informacin no tiene Marine scientist el consejo del mdico. Asegrese de hacerle al mdico cualquier pregunta que tenga. Document Released: 05/08/2011 Document Revised: 09/10/2015 Document Reviewed: 11/20/2014 Elsevier Interactive Patient Education  [employer].

## 2018-04-06 NOTE — Progress Notes (Signed)
   Subjective:    Patient ID: Chase [name], male    DOB: 1950-11-25, 67 y.o.   MRN: [id number]  Chief Complaint  Patient presents with  . pain with urinating   Pt presents to the office today with complaints of dysuria. Pt has hx of prostate cancer and is followed by Urologists every 6 months.  Dysuria   This is a new problem. The current episode started in the past 7 days. The problem occurs every urination. The problem has been gradually worsening. The quality of the pain is described as burning. The pain is mild. There has been no fever. Associated symptoms include frequency and urgency. Pertinent negatives include no discharge, flank pain, hematuria, nausea or vomiting. He has tried nothing for the symptoms. The treatment provided no relief.      Review of Systems  Gastrointestinal: Negative for nausea and vomiting.  Genitourinary: Positive for dysuria, frequency and urgency. Negative for flank pain and hematuria.  All other systems reviewed and are negative.      Objective:   Physical Exam  Constitutional: He is oriented to person, place, and time. He appears well-developed and well-nourished. No distress.  HENT:  Head: Normocephalic.  Eyes: Pupils are equal, round, and reactive to light. Right eye exhibits no discharge. Left eye exhibits no discharge.  Neck: Normal range of motion. Neck supple. No thyromegaly present.  Cardiovascular: Normal rate, regular rhythm, normal heart sounds and intact distal pulses.  No murmur heard. Pulmonary/Chest: Effort normal and breath sounds normal. No respiratory distress. He has no wheezes.  Abdominal: Soft. Bowel sounds are normal. He exhibits no distension. There is no tenderness.  Genitourinary: Penile erythema and penile tenderness (white milky discharge) present.  Musculoskeletal: Normal range of motion. He exhibits no edema or tenderness.  Neurological: He is alert and oriented to person, place, and time. He has normal reflexes. No  cranial nerve deficit.  Skin: Skin is warm and dry. No rash noted. No erythema.  Psychiatric: He has a normal mood and affect. His behavior is normal. Judgment and thought content normal.  Vitals reviewed.    BP 123/70   Pulse 81   Temp (!) 97.5 F (36.4 C) (Oral)   Ht 5\' 4"  (1.626 m)   Wt 148 lb (67.1 kg)   BMI 25.40 kg/m      Assessment & Plan:  Chase [name] comes in today with chief complaint of pain with urinating   Diagnosis and orders addressed:  1. Pain passing urine - Urinalysis, Complete  2. Penile discharge - Chlamydia/Gonococcus/Trichomonas, NAA  3. Yeast dermatitis of penis Keep clean and dry Do not itch Need to have good control of blood sugars  - fluconazole (DIFLUCAN) 150 MG tablet; Take 1 tablet (150 mg total) by mouth every three (3) days as needed.  Dispense: 3 tablet; Refill: 0 - nystatin (MYCOSTATIN/NYSTOP) powder; Apply topically 4 (four) times daily.  Dispense: 15 g; Refill: 0     Evelina Dun, FNP

## 2018-04-08 ENCOUNTER — Encounter: Payer: Self-pay | Admitting: Family Medicine

## 2018-04-08 LAB — CHLAMYDIA/GONOCOCCUS/TRICHOMONAS, NAA
Chlamydia by NAA: NEGATIVE
Gonococcus by NAA: NEGATIVE
Trich vag by NAA: NEGATIVE

## 2018-05-03 ENCOUNTER — Encounter: Payer: Self-pay | Admitting: Family Medicine

## 2018-05-03 ENCOUNTER — Ambulatory Visit (INDEPENDENT_AMBULATORY_CARE_PROVIDER_SITE_OTHER): Payer: Medicare Other | Admitting: Family Medicine

## 2018-05-03 VITALS — BP 137/84 | HR 71 | Temp 97.6°F | Ht 64.0 in | Wt 150.0 lb

## 2018-05-03 DIAGNOSIS — F410 Panic disorder [episodic paroxysmal anxiety] without agoraphobia: Secondary | ICD-10-CM

## 2018-05-03 DIAGNOSIS — F411 Generalized anxiety disorder: Secondary | ICD-10-CM | POA: Diagnosis not present

## 2018-05-03 MED ORDER — METFORMIN HCL 500 MG PO TABS: 500.0000 mg | ORAL_TABLET | ORAL | 3 refills | Status: DC

## 2018-05-03 MED ORDER — ATORVASTATIN CALCIUM 40 MG PO TABS: 40.0000 mg | ORAL_TABLET | Freq: Every evening | ORAL | 3 refills | Status: DC

## 2018-05-03 MED ORDER — LISINOPRIL 5 MG PO TABS: 5.0000 mg | ORAL_TABLET | Freq: Every day | ORAL | 3 refills | Status: DC

## 2018-05-03 NOTE — Progress Notes (Signed)
BP 137/84   Pulse 71   Temp 97.6 F (36.4 C) (Oral)   Ht 5\' 4"  (1.626 m)   Wt 150 lb (68 kg)   BMI 25.75 kg/m    Subjective:    Patient ID: Chase [name], male    DOB: 1951/02/01, 67 y.o.   MRN: [id number]  HPI: Chase [name] is a 67 y.o. male presenting on 05/03/2018 for Anxiety (3 week follow up)   HPI Anxiety Patient is coming in for anxiety recheck.  He says since starting the Cymbalta his pain has been somewhat reduced but not completely but he is not ended up in the hospital like he was before with the chest tightness and pressure and trouble breathing and the panic attacks that he was having before.  He says he is feeling a lot better on the medication feels like it is doing very well for him.  He denies any major side effects or suicidal ideations or thoughts of hurting himself. Depression screen [facility] 2/9 05/03/2018 04/06/2018 03/29/2018 03/29/2018 03/11/2018  Decreased Interest 0 0 0 0 0  Down, Depressed, Hopeless 0 0 0 0 1  PHQ - 2 Score 0 0 0 0 1     Relevant past medical, surgical, family and social history reviewed and updated as indicated. Interim medical history since our last visit reviewed. Allergies and medications reviewed and updated.  Review of Systems  Constitutional: Negative for chills and fever.  Eyes: Negative for visual disturbance.  Respiratory: Negative for shortness of breath and wheezing.   Cardiovascular: Negative for chest pain and leg swelling.  Musculoskeletal: Positive for back pain. Negative for gait problem.  Skin: Negative for rash.  Psychiatric/Behavioral: Positive for decreased concentration and dysphoric mood. Negative for self-injury, sleep disturbance and suicidal ideas. The patient is nervous/anxious.   All other systems reviewed and are negative.   Per HPI unless specifically indicated above   Allergies as of 05/03/2018   No Known Allergies     Medication List        Accurate as of 05/03/18 11:16 AM. Always use your  most recent med list.          albuterol 108 (90 Base) MCG/ACT inhaler Commonly known as:  PROVENTIL HFA;VENTOLIN HFA Inhale 2 puffs into the lungs every 6 (six) hours.   atorvastatin 40 MG tablet Commonly known as:  LIPITOR Take 40 mg by mouth every evening.   DULoxetine 30 MG capsule Commonly known as:  CYMBALTA Take 1 capsule (30 mg total) by mouth daily.   HYDROcodone-acetaminophen 10-325 MG tablet Commonly known as:  NORCO Take 1 tablet by mouth every 4 (four) hours as needed. Allergic oxycodone   hydrOXYzine 10 MG tablet Commonly known as:  ATARAX/VISTARIL Take 1 tablet (10 mg total) by mouth 3 (three) times daily as needed.   lisinopril 5 MG tablet Commonly known as:  PRINIVIL,ZESTRIL Take 5 mg by mouth daily.   metFORMIN 500 MG tablet Commonly known as:  GLUCOPHAGE Take 500-1,000 mg by mouth See admin instructions. 1,000mg  in the morning and 500mg  in the evening   methocarbamol 500 MG tablet Commonly known as:  ROBAXIN Take 1 tablet (500 mg total) by mouth every 6 (six) hours as needed for muscle spasms.   polyethylene glycol packet Commonly known as:  MIRALAX / GLYCOLAX Take 17 g by mouth daily.   tamsulosin 0.4 MG Caps capsule Commonly known as:  FLOMAX Take 0.4 mg by mouth every evening.  Objective:    BP 137/84   Pulse 71   Temp 97.6 F (36.4 C) (Oral)   Ht 5\' 4"  (1.626 m)   Wt 150 lb (68 kg)   BMI 25.75 kg/m   Wt Readings from Last 3 Encounters:  05/03/18 150 lb (68 kg)  04/06/18 148 lb (67.1 kg)  03/29/18 145 lb 12.8 oz (66.1 kg)    Physical Exam  Constitutional: He is oriented to person, place, and time. He appears well-developed and well-nourished. No distress.  Eyes: Conjunctivae are normal. No scleral icterus.  Neck: Neck supple. No thyromegaly present.  Musculoskeletal: Normal range of motion. He exhibits no edema.  Lymphadenopathy:    He has no cervical adenopathy.  Neurological: He is alert and oriented to person,  place, and time. Coordination normal.  Skin: Skin is warm and dry. No rash noted. He is not diaphoretic.  Psychiatric: His behavior is normal. His mood appears anxious. He exhibits a depressed mood. He expresses no suicidal ideation. He expresses no suicidal plans.  Nursing note and vitals reviewed.       Assessment & Plan:   Problem List Items Addressed This Visit    None    Visit Diagnoses    Generalized anxiety disorder with panic attacks    -  Primary    Continue Cymbalta as patient has been doing very well.  Follow up plan: Return in about 2 months (around 07/04/2018), or if symptoms worsen or fail to improve, for Diabetes and anxiety recheck.  Counseling provided for all of the vaccine components No orders of the defined types were placed in this encounter.   Caryl Pina, MD [facility] 05/03/2018, 11:16 AM

## 2018-06-28 ENCOUNTER — Ambulatory Visit: Payer: Medicare Other | Admitting: Nurse Practitioner

## 2018-06-28 NOTE — Progress Notes (Deleted)
Primary Care Physician:  Dettinger, Fransisca Kaufmann, MD Primary Gastroenterologist:  Dr.   Rayne Du chief complaint on file.   HPI:   Chase [name] is a 68 y.o. male who presents on referral from primary care to schedule a colonoscopy.  Nurse/phone triage was deferred to office visit due to medications likely necessitating augmented sedation.  Reviewed information provided with referral including ***.  No history of colonoscopy in our system.  Today he states   Past Medical History:  Diagnosis Date  . Arthritis   . Cancer [facility])    prostate 2017  . Depression   . Diabetes mellitus without complication ([place])   . Headache   . High cholesterol   . Hypertension   . Inguinal hernia    rt  . Neck pain   . Numbness and tingling in hands   . Urinary frequency     Past Surgical History:  Procedure Laterality Date  . ANTERIOR CERVICAL DECOMP/DISCECTOMY FUSION N/A 04/04/2015   Procedure: ANTERIOR CERVICAL DECOMPRESSION/DISCECTOMY FUSION PLATING BONEGRAFT CERVICAL FOUR-FIVE CERVICAL FIVE-SIX;  Surgeon: Ashok Pall, MD;  Location: [facility];  Service: Neurosurgery;  Laterality: N/A;  . BACK SURGERY     lower back with Dr. Christella Noa  . SHOULDER ARTHROSCOPY Right   . UPPER EXTREMITY VENOGRAPHY N/A 07/22/2016   Procedure: Upper Extremity Venography - bilateral arm;  Surgeon: Serafina Mitchell, MD;  Location: [facility];  Service: Cardiovascular;  Laterality: N/A;    Current Outpatient Medications  Medication Sig Dispense Refill  . albuterol (PROVENTIL HFA;VENTOLIN HFA) 108 (90 Base) MCG/ACT inhaler Inhale 2 puffs into the lungs every 6 (six) hours.  0  . atorvastatin (LIPITOR) 40 MG tablet Take 1 tablet (40 mg total) by mouth every evening. 90 tablet 3  . DULoxetine (CYMBALTA) 30 MG capsule Take 1 capsule (30 mg total) by mouth daily. 90 capsule 3  . HYDROcodone-acetaminophen (NORCO) 10-325 MG tablet Take 1 tablet by mouth every 4 (four) hours as needed. Allergic oxycodone 40 tablet  0  . hydrOXYzine (ATARAX/VISTARIL) 10 MG tablet Take 1 tablet (10 mg total) by mouth 3 (three) times daily as needed. 90 tablet 2  . lisinopril (PRINIVIL,ZESTRIL) 5 MG tablet Take 1 tablet (5 mg total) by mouth daily. 90 tablet 3  . metFORMIN (GLUCOPHAGE) 500 MG tablet Take 1-2 tablets (500-1,000 mg total) by mouth See admin instructions. 1,000mg  in the morning and 500mg  in the evening 180 tablet 3  . methocarbamol (ROBAXIN) 500 MG tablet Take 1 tablet (500 mg total) by mouth every 6 (six) hours as needed for muscle spasms. 60 tablet 0  . polyethylene glycol (MIRALAX) packet Take 17 g by mouth daily. 14 each 0  . tamsulosin (FLOMAX) 0.4 MG CAPS capsule Take 0.4 mg by mouth every evening.     No current facility-administered medications for this visit.     Allergies as of 06/28/2018  . (No Known Allergies)    No family history on file.  Social History   Socioeconomic History  . Marital status: Married    Spouse name: Not on file  . Number of children: 11  . Years of education: Not on file  . Highest education level: Not on file  Occupational History  . Not on file  Social Needs  . Financial resource strain: Not hard at all  . Food insecurity:    Worry: Never true    Inability: Never true  . Transportation needs:    Medical: No  Non-medical: No  Tobacco Use  . Smoking status: Former Smoker    Packs/day: 0.25    Years: 40.00    Pack years: 10.00    Types: Cigarettes  . Smokeless tobacco: Never Used  . Tobacco comment: quit 1.5 months ago  Substance and Sexual Activity  . Alcohol use: Not Currently    Comment: occassionally  . Drug use: No  . Sexual activity: Not Currently  Lifestyle  . Physical activity:    Days per week: 0 days    Minutes per session: 0 min  . Stress: Very much  Relationships  . Social connections:    Talks on phone: More than three times a week    Gets together: More than three times a week    Attends religious service: More than 4 times per  year    Active member of club or organization: Yes    Attends meetings of clubs or organizations: More than 4 times per year    Relationship status: Married  . Intimate partner violence:    Fear of current or ex partner: No    Emotionally abused: No    Physically abused: No    Forced sexual activity: No  Other Topics Concern  . Not on file  Social History Narrative  . Not on file    Review of Systems: General: Negative for anorexia, weight loss, fever, chills, fatigue, weakness. Eyes: Negative for vision changes.  ENT: Negative for hoarseness, difficulty swallowing , nasal congestion. CV: Negative for chest pain, angina, palpitations, dyspnea on exertion, peripheral edema.  Respiratory: Negative for dyspnea at rest, dyspnea on exertion, cough, sputum, wheezing.  GI: See history of present illness. GU:  Negative for dysuria, hematuria, urinary incontinence, urinary frequency, nocturnal urination.  MS: Negative for joint pain, low back pain.  Derm: Negative for rash or itching.  Neuro: Negative for weakness, abnormal sensation, seizure, frequent headaches, memory loss, confusion.  Psych: Negative for anxiety, depression, suicidal ideation, hallucinations.  Endo: Negative for unusual weight change.  Heme: Negative for bruising or bleeding. Allergy: Negative for rash or hives.    Physical Exam: There were no vitals taken for this visit. General:   Alert and oriented. Pleasant and cooperative. Well-nourished and well-developed.  Head:  Normocephalic and atraumatic. Eyes:  Without icterus, sclera clear and conjunctiva pink.  Ears:  Normal auditory acuity. Mouth:  No deformity or lesions, oral mucosa pink.  Throat/Neck:  Supple, without mass or thyromegaly. Cardiovascular:  S1, S2 present without murmurs appreciated. Normal pulses noted. Extremities without clubbing or edema. Respiratory:  Clear to auscultation bilaterally. No wheezes, rales, or rhonchi. No distress.    Gastrointestinal:  +BS, soft, non-tender and non-distended. No HSM noted. No guarding or rebound. No masses appreciated.  Rectal:  Deferred  Musculoskalatal:  Symmetrical without gross deformities. Normal posture. Skin:  Intact without significant lesions or rashes. Neurologic:  Alert and oriented x4;  grossly normal neurologically. Psych:  Alert and cooperative. Normal mood and affect. Heme/Lymph/Immune: No significant cervical adenopathy. No excessive bruising noted.    06/28/2018 7:56 AM   Disclaimer: This note was dictated with voice recognition software. Similar sounding words can inadvertently be transcribed and may not be corrected upon review.

## 2018-06-30 ENCOUNTER — Encounter: Payer: Self-pay | Admitting: Nurse Practitioner

## 2018-08-12 ENCOUNTER — Ambulatory Visit (INDEPENDENT_AMBULATORY_CARE_PROVIDER_SITE_OTHER): Payer: Medicare Other | Admitting: Family Medicine

## 2018-08-12 ENCOUNTER — Encounter: Payer: Self-pay | Admitting: Family Medicine

## 2018-08-12 ENCOUNTER — Other Ambulatory Visit: Payer: Self-pay

## 2018-08-12 VITALS — BP 117/72 | HR 74 | Temp 97.0°F | Ht 64.0 in | Wt 154.2 lb

## 2018-08-12 DIAGNOSIS — G44209 Tension-type headache, unspecified, not intractable: Secondary | ICD-10-CM | POA: Diagnosis not present

## 2018-08-12 MED ORDER — METHOCARBAMOL 500 MG PO TABS: 500.0000 mg | ORAL_TABLET | Freq: Four times a day (QID) | ORAL | 1 refills | Status: DC | PRN

## 2018-08-12 NOTE — Progress Notes (Signed)
BP 117/72   Pulse 74   Temp (!) 97 F (36.1 C) (Oral)   Ht 5\' 4"  (1.626 m)   Wt 154 lb 3.2 oz (69.9 kg)   BMI 26.47 kg/m    Subjective:    Patient ID: Chase [name], male    DOB: June 18, 1950, 68 y.o.   MRN: [id number]  HPI: Chase [name] is a 68 y.o. male presenting on 08/12/2018 for Headache (x 2 months - daily. Patient states it is worse at night) and trouble sleeping   HPI Pt is a 68 y/o male presenting with a 2 month history of daily headaches.  Pt states he feels the pain around his whole head. Pt states the pain is worse when he presses on his head, or when he lays down to sleep at night. He states he has difficulty sleeping do to the pain, and only sleeps a few hours a night.  He states, that when he does sleep, the pain improves. Pt also notes some mild photophobia. He tried taking aspirin 3 days ago with moderate relief, but has not tried anything else for pain.  Pt states he did have a similar episode 2 years ago, and pain was relieved by massage at that time.  Pt states he has been stressed lately, and experiences chronic back and shoulder pain.  He also state he stopped taking his Cymbalta about 1 month ago. Pt denies vision and hearing changes. He does experience some chronic dizziness, and occasional nausea, but denies vomiting.  Patient describes the headaches as more achy  Relevant past medical, surgical, family and social history reviewed and updated as indicated. Interim medical history since our last visit reviewed. Allergies and medications reviewed and updated.  Review of Systems  Constitutional: Positive for fatigue. Negative for fever.  HENT: Negative for congestion, ear pain, hearing loss, rhinorrhea, sinus pressure, sore throat and trouble swallowing.   Eyes: Positive for photophobia (mild). Negative for visual disturbance.  Respiratory: Negative for cough and shortness of breath.   Cardiovascular: Negative for chest pain.  Gastrointestinal: Positive for  nausea. Negative for abdominal pain and vomiting.  Musculoskeletal: Positive for back pain and neck pain.  Neurological: Positive for headaches. Negative for dizziness, syncope and numbness.    Per HPI unless specifically indicated above   Allergies as of 08/12/2018   No Known Allergies     Medication List       Accurate as of August 12, 2018 10:59 AM. Always use your most recent med list.        albuterol 108 (90 Base) MCG/ACT inhaler Commonly known as:  PROVENTIL HFA;VENTOLIN HFA Inhale 2 puffs into the lungs every 6 (six) hours.   atorvastatin 40 MG tablet Commonly known as:  LIPITOR Take 1 tablet (40 mg total) by mouth every evening.   DULoxetine 30 MG capsule Commonly known as:  Cymbalta Take 1 capsule (30 mg total) by mouth daily.   HYDROcodone-acetaminophen 10-325 MG tablet Commonly known as:  NORCO Take 1 tablet by mouth every 4 (four) hours as needed. Allergic oxycodone   hydrOXYzine 10 MG tablet Commonly known as:  ATARAX/VISTARIL Take 1 tablet (10 mg total) by mouth 3 (three) times daily as needed.   lisinopril 5 MG tablet Commonly known as:  PRINIVIL,ZESTRIL Take 1 tablet (5 mg total) by mouth daily.   metFORMIN 500 MG tablet Commonly known as:  GLUCOPHAGE Take 1-2 tablets (500-1,000 mg total) by mouth See admin instructions. 1,000mg  in the morning and 500mg  in the  evening   methocarbamol 500 MG tablet Commonly known as:  Robaxin Take 1 tablet (500 mg total) by mouth every 6 (six) hours as needed for muscle spasms.   polyethylene glycol packet Commonly known as:  MiraLax Take 17 g by mouth daily.   tamsulosin 0.4 MG Caps capsule Commonly known as:  FLOMAX Take 0.4 mg by mouth every evening.          Objective:    BP 117/72   Pulse 74   Temp (!) 97 F (36.1 C) (Oral)   Ht 5\' 4"  (1.626 m)   Wt 154 lb 3.2 oz (69.9 kg)   BMI 26.47 kg/m   Wt Readings from Last 3 Encounters:  08/12/18 154 lb 3.2 oz (69.9 kg)  05/03/18 150 lb (68 kg)   04/06/18 148 lb (67.1 kg)    Physical Exam Constitutional:      General: He is not in acute distress. HENT:     Head: Atraumatic.     Comments: Some tenderness to palpation around temples bilaterally Eyes:     Extraocular Movements: Extraocular movements intact.     Right eye: Normal extraocular motion and no nystagmus.     Left eye: Normal extraocular motion and no nystagmus.     Pupils: Pupils are equal, round, and reactive to light.  Neck:     Musculoskeletal: Normal range of motion.  Cardiovascular:     Rate and Rhythm: Normal rate and regular rhythm.     Heart sounds: Normal heart sounds. No murmur.  Pulmonary:     Effort: No respiratory distress.     Breath sounds: Normal breath sounds. No wheezing.  Lymphadenopathy:     Cervical: No cervical adenopathy.  Neurological:     Mental Status: He is alert and oriented to person, place, and time.     Cranial Nerves: No cranial nerve deficit or facial asymmetry.     Deep Tendon Reflexes: Reflexes normal.  Psychiatric:        Speech: Speech normal.        Behavior: Behavior normal.         Assessment & Plan:   Problem List Items Addressed This Visit    None    Visit Diagnoses    Tension headache    -  Primary   Relevant Medications   methocarbamol (ROBAXIN) 500 MG tablet    Tension Headache - Will prescribe Robaxin, and recommended pt return if headache worsens or does not improve   Follow up plan: Return if symptoms worsen or fail to improve.  Counseling provided for all of the vaccine components No orders of the defined types were placed in this encounter.   Johnney Killian PA-S [facility]  I was personally present for all components of the history, physical exam and/or medical decision making.  I agree with the documentation performed by the PA student and agree with assessment and plan above.  PA student was Johnney Killian. Caryl Pina, MD [facility] 08/12/2018, 11:01 AM

## 2018-08-31 ENCOUNTER — Ambulatory Visit: Payer: Medicare Other | Admitting: Nurse Practitioner

## 2018-09-08 ENCOUNTER — Ambulatory Visit: Payer: Medicare Other | Admitting: Nurse Practitioner

## 2018-09-16 DIAGNOSIS — C61 Malignant neoplasm of prostate: Secondary | ICD-10-CM | POA: Diagnosis not present

## 2018-09-21 DIAGNOSIS — R351 Nocturia: Secondary | ICD-10-CM | POA: Diagnosis not present

## 2018-09-21 DIAGNOSIS — N401 Enlarged prostate with lower urinary tract symptoms: Secondary | ICD-10-CM | POA: Diagnosis not present

## 2018-09-21 DIAGNOSIS — R9721 Rising PSA following treatment for malignant neoplasm of prostate: Secondary | ICD-10-CM | POA: Diagnosis not present

## 2018-09-21 DIAGNOSIS — Z8546 Personal history of malignant neoplasm of prostate: Secondary | ICD-10-CM | POA: Diagnosis not present

## 2018-10-13 ENCOUNTER — Other Ambulatory Visit: Payer: Self-pay | Admitting: *Deleted

## 2018-11-16 ENCOUNTER — Ambulatory Visit (INDEPENDENT_AMBULATORY_CARE_PROVIDER_SITE_OTHER): Payer: Medicare Other | Admitting: Family Medicine

## 2018-11-16 ENCOUNTER — Encounter: Payer: Self-pay | Admitting: Family Medicine

## 2018-11-16 ENCOUNTER — Other Ambulatory Visit: Payer: Self-pay

## 2018-11-16 VITALS — BP 125/84 | HR 98 | Temp 97.0°F | Ht 64.0 in | Wt 152.0 lb

## 2018-11-16 DIAGNOSIS — K4030 Unilateral inguinal hernia, with obstruction, without gangrene, not specified as recurrent: Secondary | ICD-10-CM | POA: Diagnosis not present

## 2018-11-16 NOTE — Progress Notes (Signed)
Chief Complaint  Patient presents with  . Right side groin pain    HPI  Patient presents today for  Hernia present 15 years. Started getting painful 3 mos ago. Pain has been crescendo-ing over the las  two weeks. Located at the right inguinal ring. Hurts to bend. Denies fever & vomiting. N Much decreased appetite.  PMH: Smoking status noted ROS: Per HPI  Objective: BP 125/84   Pulse 98   Temp (!) 97 F (36.1 C) (Oral)   Ht 5\' 4"  (1.626 m)   Wt 152 lb (68.9 kg)   BMI 26.09 kg/m  Gen: NAD, alert, cooperative with exam HEENT: NCAT, EOMI, PERRL CV: RRR, good S1/S2, no murmur Resp: CTABL, no wheezes, non-labored Abd: SNTND, BS present, guarding at Right inguinal ring with palpable, nonreducing  protuberant mass. Ext: No edema, warm Neuro: Alert and oriented, No gross deficits  Assessment and plan:  1. Incarcerated right inguinal hernia     No orders of the defined types were placed in this encounter.   Orders Placed This Encounter  Procedures  . Ambulatory referral to General Surgery    Referral Priority:   Urgent    Referral Type:   Surgical    Referral Reason:   Specialty Services Required    Requested Specialty:   General Surgery    Number of Visits Requested:   1    Follow up as needed.  Claretta Fraise, MD

## 2018-12-08 ENCOUNTER — Other Ambulatory Visit: Payer: Self-pay

## 2018-12-08 ENCOUNTER — Ambulatory Visit (INDEPENDENT_AMBULATORY_CARE_PROVIDER_SITE_OTHER): Payer: Medicare Other | Admitting: Nurse Practitioner

## 2018-12-08 ENCOUNTER — Encounter: Payer: Self-pay | Admitting: Nurse Practitioner

## 2018-12-08 DIAGNOSIS — Z0000 Encounter for general adult medical examination without abnormal findings: Secondary | ICD-10-CM | POA: Diagnosis not present

## 2018-12-08 DIAGNOSIS — K4090 Unilateral inguinal hernia, without obstruction or gangrene, not specified as recurrent: Secondary | ICD-10-CM | POA: Insufficient documentation

## 2018-12-08 DIAGNOSIS — Z1211 Encounter for screening for malignant neoplasm of colon: Secondary | ICD-10-CM | POA: Insufficient documentation

## 2018-12-08 NOTE — Assessment & Plan Note (Signed)
The patient is currently due for colonoscopy.  Given his ongoing complaints of inguinal hernias I feel this needs to be addressed before his colonoscopy.  Currently we are booked out about 2 to 3 months per protocol which will work in our favor in this case.  When his hernia has improved we will proceed with colonoscopy.  Proceed with colonoscopy on propofol/MAC with Dr. Oneida Alar in the near future. The risks, benefits, and alternatives have been discussed in detail with the patient. They state understanding and desire to proceed.   The patient used to drink significantly but has not had any alcohol in 4 years.  He is currently on Cymbalta.  No other anticoagulants, anxiolytics, chronic pain medications, or antidepressants.  We will plan for the procedure on propofol/MAC to promote adequate sedation.

## 2018-12-08 NOTE — Progress Notes (Signed)
Primary Care Physician:  Dettinger, Fransisca Kaufmann, MD Primary Gastroenterologist:  Dr. Oneida Alar  Chief Complaint  Patient presents with  . Colonoscopy    never had tcs    HPI:   Chase [name] is a 68 y.o. male who presents on referral from primary care for colon cancer screening.  Nurse/phone triage was deferred office visit due to medications.  Reviewed information provided with referral including primary care office visit dated 03/29/2018 which was an encounter for annual wellness exam.  It was noted at that time he was due for colon cancer screening and referred to our office.  No history of colonoscopy in our system.  Today he is accompanied by a Optometrist. Today he states he's never had a colonoscopy before. He has a right inguinal hernia and has been referred to surgery. He has not heard from surgery yet. His hernia is quite painful to him. He is having significant right inguinal hernia pain that radiates up into his abdomen; feels/hears a "grumbling sound" and has bloating associated with it. He has had the hernia "a while" but pain became a lot worse since a fall 3 months ago. Has intermittent nausea, but no vomiting. Denies hematochezia, melena, fever, chills. He has lost 8 lbs in the past 6-7 months unintentionally (subjective). Has had decreased appetite with all his symptoms. Denies URI or flu-like symptoms. Denies loss of sense of taste or smell. Denies chest pain, dyspnea, dizziness, lightheadedness, syncope, near syncope. Denies any other upper or lower GI symptoms.  Past Medical History:  Diagnosis Date  . Arthritis   . Cancer [facility])    prostate 2017  . Depression   . Diabetes mellitus without complication ([place])   . Headache   . High cholesterol   . Hypertension   . Inguinal hernia    rt  . Neck pain   . Numbness and tingling in hands   . Urinary frequency     Past Surgical History:  Procedure Laterality Date  . ANTERIOR CERVICAL DECOMP/DISCECTOMY FUSION N/A  04/04/2015   Procedure: ANTERIOR CERVICAL DECOMPRESSION/DISCECTOMY FUSION PLATING BONEGRAFT CERVICAL FOUR-FIVE CERVICAL FIVE-SIX;  Surgeon: Ashok Pall, MD;  Location: [place] [facility];  Service: Neurosurgery;  Laterality: N/A;  . BACK SURGERY     lower back with Dr. Christella Noa  . SHOULDER ARTHROSCOPY Right   . UPPER EXTREMITY VENOGRAPHY N/A 07/22/2016   Procedure: Upper Extremity Venography - bilateral arm;  Surgeon: Serafina Mitchell, MD;  Location: [place] [facility];  Service: Cardiovascular;  Laterality: N/A;    Current Outpatient Medications  Medication Sig Dispense Refill  . atorvastatin (LIPITOR) 40 MG tablet Take 1 tablet (40 mg total) by mouth every evening. 90 tablet 3  . DULoxetine (CYMBALTA) 30 MG capsule Take 1 capsule (30 mg total) by mouth daily. 90 capsule 3  . lisinopril (PRINIVIL,ZESTRIL) 5 MG tablet Take 1 tablet (5 mg total) by mouth daily. 90 tablet 3  . metFORMIN (GLUCOPHAGE) 500 MG tablet Take 1-2 tablets (500-1,000 mg total) by mouth See admin instructions. 1,000mg  in the morning and 500mg  in the evening 180 tablet 3  . polyethylene glycol (MIRALAX) packet Take 17 g by mouth daily. (Patient taking differently: Take 17 g by mouth daily as needed. ) 14 each 0   No current facility-administered medications for this visit.     Allergies as of 12/08/2018  . (No Known Allergies)    Family History  Problem Relation Age of Onset  . Colon cancer Neg Hx  Social History   Socioeconomic History  . Marital status: Married    Spouse name: Not on file  . Number of children: 11  . Years of education: Not on file  . Highest education level: Not on file  Occupational History  . Not on file  Social Needs  . Financial resource strain: Not hard at all  . Food insecurity    Worry: Never true    Inability: Never true  . Transportation needs    Medical: No    Non-medical: No  Tobacco Use  . Smoking status: Former Smoker    Packs/day: 0.25    Years: 40.00    Pack years:  10.00    Types: Cigarettes  . Smokeless tobacco: Never Used  . Tobacco comment: quit 11/2017 (as of 12/08/2018)  Substance and Sexual Activity  . Alcohol use: Not Currently    Comment: no ETOH since since 2016 (as of 12/08/2018)  . Drug use: No  . Sexual activity: Not Currently  Lifestyle  . Physical activity    Days per week: 0 days    Minutes per session: 0 min  . Stress: Very much  Relationships  . Social connections    Talks on phone: More than three times a week    Gets together: More than three times a week    Attends religious service: More than 4 times per year    Active member of club or organization: Yes    Attends meetings of clubs or organizations: More than 4 times per year    Relationship status: Married  . Intimate partner violence    Fear of current or ex partner: No    Emotionally abused: No    Physically abused: No    Forced sexual activity: No  Other Topics Concern  . Not on file  Social History Narrative  . Not on file    Review of Systems: Complete ROS negative except as per HPI.    Physical Exam: BP 129/84   Pulse 69   Temp (!) 97 F (36.1 C) (Oral)   Ht 5\' 4"  (1.626 m)   Wt 154 lb 6.4 oz (70 kg)   BMI 26.50 kg/m  General:   Alert and oriented. Pleasant and cooperative. Well-nourished and well-developed.  Head:  Normocephalic and atraumatic. Eyes:  Without icterus, sclera clear and conjunctiva pink.  Ears:  Normal auditory acuity. Cardiovascular:  S1, S2 present without murmurs appreciated. Extremities without clubbing or edema. Respiratory:  Clear to auscultation bilaterally. No wheezes, rales, or rhonchi. No distress.  Gastrointestinal:  +BS, soft, and non-distended. Moderate TTP right inguinal area and up into the RLQ; no mass-like structure or sign of incarceration or strangulation of his hernia. No HSM noted. No guarding or rebound. No masses appreciated.  Rectal:  Deferred  Musculoskalatal:  Symmetrical without gross deformities.  Neurologic:  Alert and oriented x4;  grossly normal neurologically. Psych:  Alert and cooperative. Normal mood and affect. Heme/Lymph/Immune: No excessive bruising noted.    12/08/2018 9:22 AM   Disclaimer: This note was dictated with voice recognition software. Similar sounding words can inadvertently be transcribed and may not be corrected upon review.

## 2018-12-08 NOTE — Patient Instructions (Addendum)
Your health issues we discussed today were:   Need for colonoscopy: 1. We will schedule your colonoscopy for a couple months from now to allow your hernia to be evaluated. 2. Further recommendations will be made after your colonoscopy  Abdominal pain with inguinal hernia: 1. We will notify the surgeon office to call you for an appointment 2. For your records the phone number of the surgeon office is: [phone] 3. If you have severe or worsening symptoms, go to the emergency room  Overall I recommend:  1. Continue your current medications 2. Follow-up based on recommendations made after your colonoscopy 3. Call us if you have any questions or concerns.   Because of recent events of COVID-19 ("Coronavirus"), follow CDC recommendations:  1. Wash your hand frequently 2. Avoid touching your face 3. Stay away from people who are sick 4. If you have symptoms such as fever, cough, shortness of breath then call your healthcare provider for further guidance 5. If you are sick, STAY AT HOME unless otherwise directed by your healthcare provider. 6. Follow directions from state and national officials regarding staying safe    Spanish: Sus problemas de salud que discutimos hoy fueron:  Necesidad de colonoscopia: 1. Programaremos su colonoscopia durante un par de meses a partir de ahora para permitir que se evale su hernia. 2. Se harn ms recomendaciones despus de su colonoscopia.  Dolor abdominal con hernia inguinal: 1. Notificaremos a la oficina del cirujano para que lo llamemos para una cita 2. Para sus registros, el nmero de telfono de la oficina del [place] es: [phone] 9. Si tiene sntomas graves o que [place], vaya a la sala de Multimedia programmer.  En general recomiendo: 1. Contina tus medicamentos actuales 2. Seguimiento basado en las recomendaciones hechas despus de su colonoscopia 3. Llmenos si tiene alguna pregunta o inquietud.   Debido a eventos recientes de  COVID-19 ("Coronavirus"), siga las recomendaciones de los CDC:  1. Lava tu mano frecuentemente 2. Evita tocarte la cara 3. Mantngase alejado de las personas que estn enfermas. 4. Si tiene sntomas como [place], tos, falta de [place], llame a su proveedor de atencin mdica para obtener ms ayuda. 5. Si est enfermo, qudese en casa a menos que su proveedor de atencin [place] indique lo contrario. 61. Siga las instrucciones de los funcionarios estatales y nacionales con respecto a mantenerse a salvo    At [facility] we value your feedback. You may receive a survey about your visit today. Please share your experience as we strive to create trusting relationships with our patients to provide genuine, compassionate, quality care.  We appreciate your understanding and patience as we review any laboratory studies, imaging, and other diagnostic tests that are ordered as we care for you. Our office policy is 5 business days for review of these results, and any emergent or urgent results are addressed in a timely manner for your best interest. If you do not hear from our office in 1 week, please contact us.   We also encourage the use of MyChart, which contains your medical information for your review as well. If you are not enrolled in this feature, an access code is on this after visit summary for your convenience. Thank you for allowing [place] to be involved in your care.  It was great to see you today!  I hope you have a great summer!!

## 2018-12-08 NOTE — Progress Notes (Signed)
CC'ED TO PCP 

## 2018-12-08 NOTE — Assessment & Plan Note (Signed)
The patient was diagnosed with an inguinal hernia on June 16.  He was referred to surgery due to significant symptoms.  He has not had an appointment scheduled as of yet.  No hard, masslike object in the inguinal area.  His inguinal area is quite tender with pain radiating up into his right lower quadrant.  Having associated significant nausea.  Decreased appetite as well.  I provided him with a surgical office phone number to call and follow-up.  I will also have our staff call the surgical office number to request scheduling of an appointment due to his significant symptoms.  ER precautions were given as well.

## 2018-12-09 ENCOUNTER — Telehealth: Payer: Self-pay

## 2018-12-09 NOTE — Telephone Encounter (Signed)
Noted, thanks for the assistance. 

## 2018-12-09 NOTE — Telephone Encounter (Signed)
I contacted surgeons office, left Vm for a return call,  per Eric's request yesterday.   I received a phone call from [place] at the surgeon's office this morning.  She has scheduled the pt for an apt with Dr. Arnoldo Morale on 12/14/2018 at 1:00 pm.  She has been unable to reach the pt's daughter and they have not returned her call.  She ask me to call them and let them know appointment date and time and that she has mailed a packet to them.  I called and spoke to Malachi Bonds, pt's daughter and she is aware.  She has the date, time, address and phone number if needed.   FYI to Walden Field NP.

## 2018-12-14 ENCOUNTER — Ambulatory Visit (INDEPENDENT_AMBULATORY_CARE_PROVIDER_SITE_OTHER): Payer: Medicare Other | Admitting: General Surgery

## 2018-12-14 ENCOUNTER — Encounter: Payer: Self-pay | Admitting: General Surgery

## 2018-12-14 ENCOUNTER — Other Ambulatory Visit: Payer: Self-pay

## 2018-12-14 VITALS — BP 119/78 | HR 61 | Temp 97.7°F | Resp 16 | Ht 63.0 in | Wt 150.0 lb

## 2018-12-14 DIAGNOSIS — K4090 Unilateral inguinal hernia, without obstruction or gangrene, not specified as recurrent: Secondary | ICD-10-CM

## 2018-12-14 NOTE — Progress Notes (Addendum)
Chase [name]; [id number]; 1950-10-04   HPI Patient is a 68 year old Hispanic male who presents for evaluation treatment of a right inguinal hernia.  Patient states is been present for over 15 years, but over the last few months, it has increased in size and is causing him discomfort.  The pain radiates to his right testicle.  He denies any nausea or vomiting.  He currently has a pain level of 5 out of 10.  He has had multiple back surgeries and ambulates with a cane.  History was obtained through an interpreter.  Ambulates with a cane. Past Medical History:  Diagnosis Date  . Arthritis   . Cancer [facility])    prostate 2017  . Depression   . Diabetes mellitus without complication ([place])   . Headache   . High cholesterol   . Hypertension   . Inguinal hernia    rt  . Neck pain   . Numbness and tingling in hands   . Urinary frequency     Past Surgical History:  Procedure Laterality Date  . ANTERIOR CERVICAL DECOMP/DISCECTOMY FUSION N/A 04/04/2015   Procedure: ANTERIOR CERVICAL DECOMPRESSION/DISCECTOMY FUSION PLATING BONEGRAFT CERVICAL FOUR-FIVE CERVICAL FIVE-SIX;  Surgeon: Ashok Pall, MD;  Location: [place] [facility];  Service: Neurosurgery;  Laterality: N/A;  . BACK SURGERY     lower back with Dr. Christella Noa  . SHOULDER ARTHROSCOPY Right   . UPPER EXTREMITY VENOGRAPHY N/A 07/22/2016   Procedure: Upper Extremity Venography - bilateral arm;  Surgeon: Serafina Mitchell, MD;  Location: [place] [facility] LAB;  Service: Cardiovascular;  Laterality: N/A;    Family History  Problem Relation Age of Onset  . Colon cancer Neg Hx     Current Outpatient Medications on File Prior to Visit  Medication Sig Dispense Refill  . atorvastatin (LIPITOR) 40 MG tablet Take 1 tablet (40 mg total) by mouth every evening. 90 tablet 3  . DULoxetine (CYMBALTA) 30 MG capsule Take 1 capsule (30 mg total) by mouth daily. 90 capsule 3  . lisinopril (PRINIVIL,ZESTRIL) 5 MG tablet Take 1 tablet (5 mg total) by mouth daily. 90  tablet 3  . metFORMIN (GLUCOPHAGE) 500 MG tablet Take 1-2 tablets (500-1,000 mg total) by mouth See admin instructions. 1,000mg  in the morning and 500mg  in the evening 180 tablet 3  . polyethylene glycol (MIRALAX) packet Take 17 g by mouth daily. (Patient taking differently: Take 17 g by mouth daily as needed. ) 14 each 0   No current facility-administered medications on file prior to visit.     No Known Allergies  Social History   Substance and Sexual Activity  Alcohol Use Not Currently   Comment: no ETOH since since 2016 (as of 12/08/2018)    Social History   Tobacco Use  Smoking Status Former Smoker  . Packs/day: 0.25  . Years: 40.00  . Pack years: 10.00  . Types: Cigarettes  Smokeless Tobacco Never Used  Tobacco Comment   quit 11/2017 (as of 12/08/2018)    Review of Systems  Constitutional: Negative.   HENT: Negative.   Eyes: Positive for blurred vision.  Respiratory: Negative.   Cardiovascular: Negative.   Gastrointestinal: Positive for abdominal pain and heartburn.  Genitourinary: Positive for dysuria, frequency and urgency.  Musculoskeletal: Positive for back pain, joint pain and neck pain.  Skin: Negative.   Neurological: Positive for headaches.  Endo/Heme/Allergies: Negative.   Psychiatric/Behavioral: Negative.     Objective   Vitals:   12/14/18 1336  BP: 119/78  Pulse: 61  Resp:  16  Temp: 97.7 F (36.5 C)  SpO2: 96%    Physical Exam Vitals signs reviewed.  Constitutional:      Appearance: Normal appearance. He is normal weight. He is not ill-appearing.  HENT:     Head: Normocephalic and atraumatic.  Cardiovascular:     Rate and Rhythm: Normal rate and regular rhythm.     Heart sounds: Normal heart sounds. No murmur. No friction rub. No gallop.   Pulmonary:     Effort: Pulmonary effort is normal. No respiratory distress.     Breath sounds: Normal breath sounds. No stridor. No wheezing, rhonchi or rales.  Abdominal:     General: Abdomen is flat.  There is no distension.     Palpations: Abdomen is soft. There is no mass.     Tenderness: There is no abdominal tenderness. There is no guarding or rebound.     Hernia: A hernia is present.     Comments: Reducible right inguinal hernia.  Genitourinary:    Comments: Genitourinary examination is within normal limits. Skin:    General: Skin is warm and dry.  Neurological:     Mental Status: He is alert and oriented to person, place, and time.   Primary care notes reviewed  Assessment  Right inguinal hernia Plan   Patient is scheduled for right inguinal herniorrhaphy with mesh on 12/22/2018.  The risks and benefits of the procedure including bleeding, infection, mesh use, the possibility of recurrence of the hernia were fully explained to the patient, who gave informed consent.

## 2018-12-14 NOTE — Patient Instructions (Signed)
Hernia inguinal en los adultos Inguinal Hernia, Adult Una hernia inguinal se produce cuando la grasa o los intestinos empujan a travs de una zona dbil de un msculo donde se unen la pierna y el abdomen inferior (ingle). Esto crea un bulto. Este tipo de hernia tambin puede aparecer en:  En el escroto, si es varn.  En los pliegues de la piel alrededor de la vagina, si es mujer. Existen tres tipos de hernias inguinales:  Hernias que se pueden empujar hacia adentro del abdomen (son reducibles). Este tipo de hernias rara vez provoca dolor.  Hernias que no son reducibles (estn encarceladas).  Hernias que no son reducibles y pierden la irrigacin sangunea (estn estranguladas). Este tipo de hernia requiere ciruga de emergencia. Cules son las causas? Esta afeccin se produce cuando tiene una zona dbil en los msculos o en los tejidos de la ingle. Esta zona dbil se desarrolla con el transcurso del tiempo. La hernia puede salirse por la zona dbil cuando ejerce un esfuerzo repentino sobre los msculos abdominales inferiores, por ejemplo, en los siguientes casos:  Levanta un objeto pesado.  Hace esfuerzos para defecar. El estreimiento puede llevar a tener que hacer un gran esfuerzo.  Tos. Qu incrementa el riesgo? Es ms probable que esta afeccin se manifieste en:  Los hombres.  Las mujeres embarazadas.  Personas que: ? Tiene sobrepeso. ? Realizan trabajos que requieren permanecer estar de pie durante largos perodos o levantar cargas pesadas. ? Han tenido una hernia inguinal previamente. ? Fuman o tienen una enfermedad pulmonar. Estos factores pueden causar una tos duradera (crnica). Cules son los signos o los sntomas? Los sntomas pueden depender del tamao de la hernia. Con frecuencia, una pequea hernia inguinal no tiene sntomas. Estos son algunos de los sntomas de una hernia ms grande:  Un bulto en la zona de la ingle. Este es fcil de ver cuando se encuentra de  pie. Podra no ser visible si se encuentra acostado.  Dolor o ardor en la ingle. Esto podra empeorar cuando levanta un objeto, realiza un esfuerzo o tose.  Un dolor sordo o una sensacin de presin en la ingle.  Un bulto inusual en el escroto del hombre. Los sntomas de una hernia inguinal estrangulada pueden incluir lo siguiente:  Un bulto en la ingle que es muy doloroso y sensible al tacto.  Un bulto que se torna de color rojo o prpura.  Fiebre, nuseas y vmitos.  Imposibilidad de defecar o eliminar gases. Cmo se diagnostica? Esta afeccin se diagnostica en funcin de los sntomas, los antecedentes mdicos y un examen fsico. El mdico puede examinar la zona de la ingle y pedirle que tosa. Cmo se trata? El tratamiento depende del tamao de la hernia y de si tiene sntomas o no. Si no tiene sntomas, el mdico podr indicarle que controle cuidadosamente la hernia y que concurra a las visitas de control. Si tiene sntomas o una hernia grande, es posible que necesite ciruga para repararla. Siga estas indicaciones en su casa: Estilo de vida  Evite levantar objetos pesados.  Intente no estar de pie durante mucho tiempo.  No consuma ningn producto que contenga nicotina o tabaco, como cigarrillos y cigarrillos electrnicos. Si necesita ayuda para dejar de fumar, consulte al mdico.  Mantenga un peso saludable. Prevencin del estreimiento  Tome medidas para evitar el estreimiento. El estreimiento obliga a realizar esfuerzos para defecar, los cuales pueden agravar una hernia o causar la rotura de la reparacin. El mdico puede recomendarle que: ? Beba suficiente   lquido para mantener la orina de color amarillo plido. ? Consuma alimentos ricos en fibra, como frutas y verduras frescas, cereales integrales y frijoles. ? Limite el consumo de alimentos ricos en grasa y azcares procesados, como los alimentos fritos o dulces. ? Tome medicamentos recetados o de venta libre para el  estreimiento. Instrucciones generales  Puede intentar colocar la hernia en su lugar ejerciendo sobre esta una presin muy suave mientras est acostado. No intente forzar el bulto hacia adentro nuevamente si este no entra fcilmente.  Observe si la hernia cambia de forma, de color o de tamao. Solicite ayuda de inmediato si observa algn cambio.  Tome los medicamentos de venta libre y los recetados solamente como se lo haya indicado el mdico.  Concurra a todas las visitas de control como se lo haya indicado el mdico. Esto es importante. Comunquese con un mdico si:  Tiene fiebre.  Presenta nuevos sntomas.  Los sntomas empeoran. Solicite ayuda de inmediato si:  Tiene dolor en la ingle que comienza repentinamente y empeora.  Tiene un bulto en la ingle que tiene las siguientes caractersticas: ? Se agranda de repente y no se encoge. ? Se vuelve rojo o prpura y causa dolor al tacto.  Si es hombre y tiene un dolor repentino en el escroto o el tamao de este cambia de repente.  No puede volver a colocar la hernia en su lugar al ejercer sobre esta una presin muy suave mientras est acostado. No intente forzar el bulto hacia adentro nuevamente si este no entra fcilmente.  Tiene nuseas o vmitos que no desaparecen.  La frecuencia cardaca est acelerada.  Tiene dificultad para defecar o eliminar gases. Estos sntomas pueden representar un problema grave que constituye una emergencia. No espere hasta que los sntomas desaparezcan. Solicite atencin mdica de inmediato. Comunquese con el servicio de emergencias de su localidad (911 en los [place]). Resumen  Una hernia inguinal se produce cuando la grasa o los intestinos empujan a travs de una zona dbil de un msculo donde se unen la pierna y el abdomen inferior (ingle).  Esta afeccin ocurre cuando tiene una zona dbil en los msculos o en los tejidos de la ingle.  Los sntomas pueden depender del tamao de la hernia,  y pueden incluir dolor o hinchazn de la ingle. Con frecuencia, una pequea hernia inguinal no tiene sntomas.  Si no tiene sntomas, es posible que no necesite tratamiento. Si tiene sntomas o una hernia grande, es posible que necesite ciruga para reparar la hernia.  Evite levantar objetos pesados. Tambin evite estar de pie durante mucho tiempo. Esta informacin no tiene como fin reemplazar el consejo del mdico. Asegrese de hacerle al mdico cualquier pregunta que tenga. Document Released: 09/13/2012 Document Revised: 07/12/2017 Document Reviewed: 05/06/2017 Elsevier Patient Education  2020 [employer]  

## 2018-12-14 NOTE — H&P (Signed)
Chase [name]; [id number]; 1951-04-01   HPI Patient is a 68 year old Hispanic male who presents for evaluation treatment of a right inguinal hernia.  Patient states is been present for over 15 years, but over the last few months, it has increased in size and is causing him discomfort.  The pain radiates to his right testicle.  He denies any nausea or vomiting.  He currently has a pain level of 5 out of 10.  He has had multiple back surgeries and ambulates with a cane.  History was obtained through an interpreter.  Ambulates with a cane. Past Medical History:  Diagnosis Date  . Arthritis   . Cancer [facility])    prostate 2017  . Depression   . Diabetes mellitus without complication ([place])   . Headache   . High cholesterol   . Hypertension   . Inguinal hernia    rt  . Neck pain   . Numbness and tingling in hands   . Urinary frequency     Past Surgical History:  Procedure Laterality Date  . ANTERIOR CERVICAL DECOMP/DISCECTOMY FUSION N/A 04/04/2015   Procedure: ANTERIOR CERVICAL DECOMPRESSION/DISCECTOMY FUSION PLATING BONEGRAFT CERVICAL FOUR-FIVE CERVICAL FIVE-SIX;  Surgeon: Ashok Pall, MD;  Location: [place] [facility];  Service: Neurosurgery;  Laterality: N/A;  . BACK SURGERY     lower back with Dr. Christella Noa  . SHOULDER ARTHROSCOPY Right   . UPPER EXTREMITY VENOGRAPHY N/A 07/22/2016   Procedure: Upper Extremity Venography - bilateral arm;  Surgeon: Serafina Mitchell, MD;  Location: [place] [facility];  Service: Cardiovascular;  Laterality: N/A;    Family History  Problem Relation Age of Onset  . Colon cancer Neg Hx     Current Outpatient Medications on File Prior to Visit  Medication Sig Dispense Refill  . atorvastatin (LIPITOR) 40 MG tablet Take 1 tablet (40 mg total) by mouth every evening. 90 tablet 3  . DULoxetine (CYMBALTA) 30 MG capsule Take 1 capsule (30 mg total) by mouth daily. 90 capsule 3  . lisinopril (PRINIVIL,ZESTRIL) 5 MG tablet Take 1 tablet (5 mg total) by mouth daily. 90  tablet 3  . metFORMIN (GLUCOPHAGE) 500 MG tablet Take 1-2 tablets (500-1,000 mg total) by mouth See admin instructions. 1,000mg  in the morning and 500mg  in the evening 180 tablet 3  . polyethylene glycol (MIRALAX) packet Take 17 g by mouth daily. (Patient taking differently: Take 17 g by mouth daily as needed. ) 14 each 0   No current facility-administered medications on file prior to visit.     No Known Allergies  Social History   Substance and Sexual Activity  Alcohol Use Not Currently   Comment: no ETOH since since 2016 (as of 12/08/2018)    Social History   Tobacco Use  Smoking Status Former Smoker  . Packs/day: 0.25  . Years: 40.00  . Pack years: 10.00  . Types: Cigarettes  Smokeless Tobacco Never Used  Tobacco Comment   quit 11/2017 (as of 12/08/2018)    Review of Systems  Constitutional: Negative.   HENT: Negative.   Eyes: Positive for blurred vision.  Respiratory: Negative.   Cardiovascular: Negative.   Gastrointestinal: Positive for abdominal pain and heartburn.  Genitourinary: Positive for dysuria, frequency and urgency.  Musculoskeletal: Positive for back pain, joint pain and neck pain.  Skin: Negative.   Neurological: Positive for headaches.  Endo/Heme/Allergies: Negative.   Psychiatric/Behavioral: Negative.     Objective   Vitals:   12/14/18 1336  BP: 119/78  Pulse: 61  Resp:  16  Temp: 97.7 F (36.5 C)  SpO2: 96%    Physical Exam Vitals signs reviewed.  Constitutional:      Appearance: Normal appearance. He is normal weight. He is not ill-appearing.  HENT:     Head: Normocephalic and atraumatic.  Cardiovascular:     Rate and Rhythm: Normal rate and regular rhythm.     Heart sounds: Normal heart sounds. No murmur. No friction rub. No gallop.   Pulmonary:     Effort: Pulmonary effort is normal. No respiratory distress.     Breath sounds: Normal breath sounds. No stridor. No wheezing, rhonchi or rales.  Abdominal:     General: Abdomen is flat.  There is no distension.     Palpations: Abdomen is soft. There is no mass.     Tenderness: There is no abdominal tenderness. There is no guarding or rebound.     Hernia: A hernia is present.     Comments: Reducible right inguinal hernia.  Genitourinary:    Comments: Genitourinary examination is within normal limits. Skin:    General: Skin is warm and dry.  Neurological:     Mental Status: He is alert and oriented to person, place, and time.   Primary care notes reviewed  Assessment  Right inguinal hernia Plan   Patient is scheduled for right inguinal herniorrhaphy with mesh on 12/22/2018.  The risks and benefits of the procedure including bleeding, infection, mesh use, the possibility of recurrence of the hernia were fully explained to the patient, who gave informed consent.

## 2018-12-20 ENCOUNTER — Encounter (HOSPITAL_COMMUNITY): Payer: Self-pay

## 2018-12-20 ENCOUNTER — Encounter (HOSPITAL_COMMUNITY)
Admission: RE | Admit: 2018-12-20 | Discharge: 2018-12-20 | Disposition: A | Discharge status: Home / Self Care | Payer: Medicare Other | Source: Ambulatory Visit | Attending: General Surgery | Admitting: General Surgery

## 2018-12-20 ENCOUNTER — Other Ambulatory Visit (HOSPITAL_COMMUNITY)
Admission: RE | Admit: 2018-12-20 | Discharge: 2018-12-20 | Disposition: A | Discharge status: Home / Self Care | Payer: Medicare Other | Source: Ambulatory Visit | Attending: General Surgery | Admitting: General Surgery

## 2018-12-20 ENCOUNTER — Other Ambulatory Visit: Payer: Self-pay

## 2018-12-20 DIAGNOSIS — D176 Benign lipomatous neoplasm of spermatic cord: Secondary | ICD-10-CM | POA: Diagnosis not present

## 2018-12-20 DIAGNOSIS — Z8546 Personal history of malignant neoplasm of prostate: Secondary | ICD-10-CM | POA: Diagnosis not present

## 2018-12-20 DIAGNOSIS — F329 Major depressive disorder, single episode, unspecified: Secondary | ICD-10-CM | POA: Diagnosis not present

## 2018-12-20 DIAGNOSIS — I10 Essential (primary) hypertension: Secondary | ICD-10-CM | POA: Diagnosis not present

## 2018-12-20 DIAGNOSIS — Z79899 Other long term (current) drug therapy: Secondary | ICD-10-CM | POA: Diagnosis not present

## 2018-12-20 DIAGNOSIS — M1990 Unspecified osteoarthritis, unspecified site: Secondary | ICD-10-CM | POA: Diagnosis not present

## 2018-12-20 DIAGNOSIS — E119 Type 2 diabetes mellitus without complications: Secondary | ICD-10-CM | POA: Diagnosis not present

## 2018-12-20 DIAGNOSIS — Z01812 Encounter for preprocedural laboratory examination: Secondary | ICD-10-CM | POA: Insufficient documentation

## 2018-12-20 DIAGNOSIS — K4090 Unilateral inguinal hernia, without obstruction or gangrene, not specified as recurrent: Secondary | ICD-10-CM | POA: Diagnosis not present

## 2018-12-20 DIAGNOSIS — Z20828 Contact with and (suspected) exposure to other viral communicable diseases: Secondary | ICD-10-CM | POA: Insufficient documentation

## 2018-12-20 DIAGNOSIS — Z1159 Encounter for screening for other viral diseases: Secondary | ICD-10-CM | POA: Diagnosis not present

## 2018-12-20 DIAGNOSIS — Z7984 Long term (current) use of oral hypoglycemic drugs: Secondary | ICD-10-CM | POA: Diagnosis not present

## 2018-12-20 DIAGNOSIS — F17210 Nicotine dependence, cigarettes, uncomplicated: Secondary | ICD-10-CM | POA: Diagnosis not present

## 2018-12-20 DIAGNOSIS — E7800 Pure hypercholesterolemia, unspecified: Secondary | ICD-10-CM | POA: Diagnosis not present

## 2018-12-20 LAB — HEMOGLOBIN A1C
Hgb A1c MFr Bld: 9 % — ABNORMAL HIGH (ref 4.8–5.6)
Mean Plasma Glucose: 211.6 mg/dL

## 2018-12-20 LAB — CBC WITH DIFFERENTIAL/PLATELET
Abs Immature Granulocytes: 0.03 10*3/uL (ref 0.00–0.07)
Basophils Absolute: 0.1 10*3/uL (ref 0.0–0.1)
Basophils Relative: 1 %
Eosinophils Absolute: 0.4 10*3/uL (ref 0.0–0.5)
Eosinophils Relative: 7 %
HCT: 49.9 % (ref 39.0–52.0)
Hemoglobin: 16.3 g/dL (ref 13.0–17.0)
Immature Granulocytes: 1 %
Lymphocytes Relative: 27 %
Lymphs Abs: 1.6 10*3/uL (ref 0.7–4.0)
MCH: 28.3 pg (ref 26.0–34.0)
MCHC: 32.7 g/dL (ref 30.0–36.0)
MCV: 86.8 fL (ref 80.0–100.0)
Monocytes Absolute: 0.4 10*3/uL (ref 0.1–1.0)
Monocytes Relative: 7 %
Neutro Abs: 3.5 10*3/uL (ref 1.7–7.7)
Neutrophils Relative %: 57 %
Platelets: 209 10*3/uL (ref 150–400)
RBC: 5.75 MIL/uL (ref 4.22–5.81)
RDW: 13.8 % (ref 11.5–15.5)
WBC: 6.1 10*3/uL (ref 4.0–10.5)
nRBC: 0 % (ref 0.0–0.2)

## 2018-12-20 LAB — BASIC METABOLIC PANEL
Anion gap: 12 (ref 5–15)
BUN: 14 mg/dL (ref 8–23)
CO2: 23 mmol/L (ref 22–32)
Calcium: 9.8 mg/dL (ref 8.9–10.3)
Chloride: 101 mmol/L (ref 98–111)
Creatinine, Ser: 0.59 mg/dL — ABNORMAL LOW (ref 0.61–1.24)
GFR calc Af Amer: >60 mL/min (ref >60)
GFR calc non Af Amer: >60 mL/min (ref >60)
Glucose, Bld: 226 mg/dL — ABNORMAL HIGH (ref 70–99)
Potassium: 4.3 mmol/L (ref 3.5–5.1)
Sodium: 136 mmol/L (ref 135–145)

## 2018-12-20 LAB — GLUCOSE, CAPILLARY: Glucose-Capillary: 220 mg/dL — ABNORMAL HIGH (ref 70–99)

## 2018-12-20 NOTE — Patient Instructions (Signed)
Instrucciones Para Antes de la Ciruga   Su ciruga est programada para-(your procedure is scheduled on)  12/22/2018    Chase [name]      Por favor llame al [phone] si tiene algn problema en la maana de la ciruga. (please call if you have any problems the morning of surgery.)                  Recuerde: (Remember)   No coma alimentos ni tome lquidos, incluyendo agua, despus de la medianoche del  (Do not eat food or drink liquids including water after midnight on 12/21/2018   [facility] estas medicinas en la maana de la ciruga con un SORBITO de agua (take these meds the morning of surgery with a SIP of water) Cymbalta   Puede cepillarse los dientes en la maana de la ciruga. (you may brush your teeth the morning of surgery)   No use joyas, maquillaje de ojos, lpiz labial, crema para el cuerpo o esmalte de uas oscuro. (Do not wear jewelry, eye makeup, lipstick, body lotion, or dark fingernail polish)   No puede usar desodorante. (you may wear deodorant)   Si va a ser ingresado despues de la ciruga, deje la maleta en el carro hasta que se le haya asignado una habitacin. (If you are to be admitted after surgery, leave suitcase in car until your room has been assigned.)   A los pacientes que se les d de alta el mismo da no se les permitir manejar a casa.  (Patients discharged on the day of surgery will not be allowed to drive home)   Use ropa suelta y cmoda de regreso a casa. (wear loose comfortable clothes for ride home)        Reparacin abierta de una hernia en los adultos, cuidados posteriores Open Hernia Repair, Adult, Care After Estas indicaciones le proporcionan informacin acerca de cmo deber cuidarse despus del procedimiento. El mdico tambin podr darle indicaciones ms especficas. Si tiene problemas o preguntas, llame al mdico. Siga estas indicaciones en su casa: Cuidado del corte  quirrgico (incisin)   Siga las indicaciones del mdico en lo que respecta al cuidado de la zona del corte quirrgico. [place] lo siguiente: ? Lvese las manos con agua y jabn antes de [place]arry manager las vendas (vendaje). Use un desinfectante para manos si no dispone de [place] y [place]. ? Cambie el vendaje como se lo haya indicado el mdico. ? No retire los puntos (suturas), la goma para cerrar la piel o las tiras [place]. Tal vez deban dejarse puestos en la piel durante 2semanas o ms tiempo. Si las tiras [place] se despegan y se enroscan, puede recortar los bordes sueltos. No retire las tiras [facility] por completo a menos que el mdico lo autorice.  Controle el corte quirrgico todos los das para detectar signos de infeccin. Est atento a los siguientes signos: ? Aumento del enrojecimiento, de la hinchazn o del dolor. ? Ms lquido Delorise Shiner. ? Calor. ? Pus o mal olor. Actividad  No conduzca ni use maquinaria pesada mientras toma analgsicos recetados. No conduzca hasta que el mdico lo autorice.  Hasta que su mdico lo autorice: ? No levante ningn objeto que pese  ms de 10libras (4,5kg). ? No practique deportes de contacto.  Retome sus actividades habituales como se lo haya indicado el mdico. Pregntele al mdico qu actividades son seguras para usted. Instrucciones generales  A fin de prevenir o tratar el estreimiento mientras toma analgsicos recetados, el mdico puede recomendarle lo siguiente: ? Beber suficiente lquido para mantener el pis (orina) claro o de color amarillo plido. ? Tomar medicamentos recetados o de [employer]. ? Consumir alimentos ricos en fibra, como frutas y verduras frescas, cereales integrales y frijoles. ? Limitar el consumo de alimentos con alto contenido de grasas y azcares procesados, como alimentos fritos o dulces.  Tome los medicamentos de venta libre y los recetados solamente como se lo haya indicado el mdico.  No tome baos de inmersin, no  practique natacin ni use el jacuzzi hasta que el mdico lo autorice.  Concurra a todas las visitas de control como se lo haya indicado el mdico. Esto es importante. Comunquese con un mdico si:  Le aparece una erupcin cutnea.  Tiene ms enrojecimiento, hinchazn o dolor alrededor del corte quirrgico.  Aumenta la cantidad de lquido o sangre que sale del corte.  El corte est caliente al tacto.  Tiene pus o percibe mal olor que emana del corte quirrgico.  Tiene fiebre o siente escalofros.  Observa sangre en las heces (materia fecal).  No ha defecado en 2 o 3das.  Los medicamentos no Forensic psychologist. Solicite ayuda de inmediato si:  Electronics engineer o le falta el aire.  Tiene sensacin de desvanecimiento.  Se siente dbil y mareado (sensacin de desvanecimiento).  Siente Geophysical data processor.  Vomita y Printmaker. Esta informacin no tiene Marine scientist el consejo del mdico. Asegrese de hacerle al mdico cualquier pregunta que tenga. Document Released: 06/09/2014 Document Revised: 08/22/2016 Document Reviewed: 10/31/2015 Elsevier Patient Education  2020 [place] y beneficios de la anestesia general Risks and Benefits of General Anesthesia La anestesia general es el uso de medicamentos para hacerlo dormir (dejarlo inconsciente) para Optometrist un procedimiento mdico. Los medicamentos causan un sueo profundo en el que no siente dolor y no tiene consciencia de lo que est sucediendo. La anestesia general la administra un mdico con capacitacin en anestesia (especialista en anestesia). Suele recomendarse cuando un procedimiento:  Va a Statistician.  Requiere que se quede quieto o que est en una posicin inusual.  Es mayor y puede provocar que pierda [place].  Es imposible de Optometrist sin anestesia general. Los medicamentos utilizados para la anestesia general se llaman anestsicos generales. Durante la anestesia general, estos  medicamentos se administran junto con medicamentos que:  Teacher, music.  Controlan su presin arterial.  Relajan los msculos. Cules son los beneficios de la anestesia general? Este tipo de anestesia:  Hace que sea posible realizar procedimientos que seran demasiado dolorosos o estresantes de llevar a cabo mientras el paciente est despierto. Este es el principal beneficio.  Le permite al mdico controlarle la respiracin y la presin arterial. Esto puede evitar problemas durante el procedimiento.  Puede administrarse rpidamente en caso de emergencia.  Puede revertirse rpidamente al finalizar el procedimiento.  Puede continuarse durante un largo perodo de [place]. Cules son los riesgos de la anestesia general? Los riesgos de la anestesia general incluyen los siguientes:  Risk analyst al medicamento.  Problemas en el corazn o los pulmones.  Agitacin grave que requiere Psychologist, clinical.  Inhalar alimentos o lquidos del estmago a los pulmones (aspiracin).  Lesin en los nervios.  Lesin [employer].  Accidente cerebrovascular.  Estar consciente durante la [place] y ser incapaz de moverse (poco frecuente). La anestesia general puede causarle efectos secundarios despus de que se despierte. Los efectos secundarios son comunes, pero las reacciones graves son poco frecuentes. Los efectos secundarios comunes incluyen los siguientes:  Confusin.  Nuseas y vmitos.  Sequedad de boca.  Estremecimiento.  Dolor de Investment banker, operational.  Cansancio. Resumen  La anestesia general es el uso de medicamentos para dormir a Arts administrator (dejarla inconsciente) para Optometrist un procedimiento mdico.  La anestesia general hace que sea posible realizar procedimientos mdicos que seran demasiado dolorosos o estresantes de llevar a cabo mientras el paciente est despierto.  Los efectos secundarios son comunes, pero las reacciones graves son poco frecuentes. Esta  informacin no tiene Marine scientist el consejo del mdico. Asegrese de hacerle al mdico cualquier pregunta que tenga. Document Released: 01/27/2017 Document Revised: 11/27/2017 Document Reviewed: 11/27/2017 Elsevier Patient Education  2020 [place] usar clorhexidina para baarse How to Use Chlorhexidine for Bathing El gluconato de clorhexidina (CHG) es una solucin desinfectante (antisptica) que se utiliza para limpiar la piel. Puede eliminar las bacterias que normalmente viven en la piel y mantenerlas alejadas durante aproximadamente 24 horas. Para limpiarse la piel con CHG, es posible que le den lo siguiente:  Una solucin de CHG para usar en la ducha o como parte de un bao de [place].  Un pao preenvasado que contenga CHG. Limpiar la piel con CHG puede ayudar a disminuir el riesgo de infeccin:  Mientras permanece en la unidad de cuidados intensivos del hospital.  Si tiene un acceso vascular, como una va central, para proporcionar acceso a corto o largo plazo a las venas.  Si tiene un catter para que drene orina de la vejiga.  Si tiene Animal nutritionist. El respirador es un aparato que lo ayuda a Ambulance person al hacer que el aire entre y salga de sus pulmones.  Despus de [place]. Cules son los riesgos? Los riesgos de usar de CHG incluyen los siguientes:  Reacciones en la piel.  Prdida de la audicin si el CHG ingresa en los odos.  Lesin ocular si el CHG ingresa en los ojos y no se enjuaga.  El CHG es inflamable. Asegrese de no fumar ni acercarse al fuego luego de aplicarse CHG en la piel. No use CHG:  Si es alrgico a la clorhexidina o anteriormente tuvo una reaccin a la clorhexidina.  En bebs de menos de 2 meses. Cmo usar la solucin de CHG  Use CHG nicamente como se lo indique su mdico y siga las instrucciones de la etiqueta.  Use la cantidad de CHG que le indicaron. Usualmente, la medida es una botella. Durante una ducha Siga estos pasos  al usar la solucin de CHG durante una ducha (a menos que su mdico le brinde instrucciones diferentes): 1. Comience a ducharse. 2. Lvese la cara y el cabello con el jabn y el champ que utiliza habitualmente. [place] de abajo del agua. 4. Vierta el CHG en un pao limpio. No use ningn cepillo o esponja con bordes rugosos. 5. Comience en el cuello y colquese la espuma en todo el cuerpo [employer]. Asegrese de seguir estas instrucciones: ? Si le harn una ciruga, preste especial atencin a la parte del cuerpo Research officer, political party. Frote la zona durante al menos 1 minuto. ? No use el CHG en su cabeza o  cara. Si la solucin entra en los odos o los ojos, enjuague bien con [place]. ? Evite la zona genital. ? Evite las zonas de la piel que estn lastimadas o tengan cortes o raspaduras. ? Frote su espalda y [employer]. Asegrese de lavar los pliegues de la piel. 6. Deje actuar la espuma por 1 o 2 minutos, o por el tiempo que le haya indicado el mdico. 7. Enjuguese todo el cuerpo bajo la ducha. Asegrese de enjuagar bien todos los pliegues y hendiduras de la piel. 8. Seque con una toalla limpia. Despus no aplique ninguna sustancia en el cuerpo, como polvo, locin o perfume, excepto que se lo indique el mdico. Solo use las lociones recomendadas por el fabricante. 9. Pngase pijama o ropa limpia. 10. Si es la noche anterior a la ciruga, duerma en sbanas limpias.  Durante un bao de esponja Siga estos pasos al usar la solucin de CHG durante un bao de [place] (a menos que su mdico le brinde instrucciones diferentes): 1. Lvese la cara y el cabello con el jabn y el champ que utiliza habitualmente. 2. Vierta el CHG en un pao limpio. 3. Comience en el cuello y colquese la espuma en todo el cuerpo [employer]. Asegrese de seguir estas instrucciones: ? Si le harn una ciruga, preste especial atencin a la parte del cuerpo Veterinary surgeon. Frote la zona durante al menos 1 minuto. ? No use el CHG en su cabeza o cara. Si la solucin [employer] odos o los ojos, enjuague bien con [place]. ? Evite la zona genital. ? Evite las zonas de la piel que estn lastimadas o tengan cortes o raspaduras. ? Frote su espalda y [employer]. Asegrese de lavar los pliegues de la piel. 4. Deje actuar la espuma por 1 o 2 minutos, o por el tiempo que le haya indicado el mdico. 5. Con un pao limpio y hmedo diferente, enjuguese bien todo el cuerpo. Asegrese de enjuagar bien todos los pliegues y hendiduras de la piel. 6. Seque con una toalla limpia. Despus no aplique ninguna sustancia en el cuerpo, como polvo, locin o perfume, excepto que se lo indique el mdico. Solo use las lociones recomendadas por el fabricante. 7. Pngase pijama o ropa limpia. 8. Si es la noche anterior a la ciruga, duerma en sbanas limpias. [employer] paos preenvasados de CHG  Use los paos de CHG nicamente como se lo haya indicado el mdico y siga las instrucciones de la etiqueta.  Use los paos de CHG sobre la piel limpia y [place].  No use el pao de CHG en la cabeza o el rostro a menos que el mdico se lo indique.  Cuando lave con el pao de CHG: ? Evite la zona genital. ? Evite las zonas de la piel que estn lastimadas o tengan cortes o raspaduras. Antes de la ciruga Siga estos pasos al usar un pao de CHG para limpiar antes de una ciruga (a menos que su mdico le brinde instrucciones diferentes): 1. Con el pao de CHG, frtese con firmeza la parte del cuerpo donde le realizarn la ciruga. Frtese de un lado al otro durante 3 minutos. El rea del cuerpo debe estar completamente hmeda con CHG cuando termine de frotar. 2. No enjuague. Deseche el pao y deje que el rea se seque al aire. Despus no aplique ninguna sustancia sobre el rea, como polvos, lociones o perfume. 3. Pngase pijama o ropa limpia. 4. Si  es la noche anterior a la ciruga,  duerma en sbanas limpias.  Para baarse en general Siga estos pasos al usar el pao de CHG para baarse en general (a menos que su mdico le brinde instrucciones diferentes). 1. Use un pao de CHG diferente en cada rea del cuerpo. Asegrese de [employer] de la piel y [place] dedos de las manos y de los pies. Lvese el cuerpo en el siguiente orden, usando un pao nuevo despus de cada paso: ? La parte delantera del cuello, los hombros y [place]. ? Ambos brazos, debajo de los brazos y [place]. ? El estmago y la zona de la ingle, evitando los genitales. ? La pierna y el pie derechos. ? La pierna y el pie izquierdos. ? La parte posterior del cuello, la espalda y las nalgas. 2. No enjuague. Deseche el pao y deje que el rea se seque al aire. Despus no aplique ninguna sustancia en el cuerpo, como polvo, locin o perfume, excepto que se lo indique el mdico. Solo use las lociones recomendadas por el fabricante. 3. Pngase pijama o ropa limpia. Comunquese con un mdico si:  La piel se irrita despus de frotarla.  Tiene preguntas sobre cmo usar la solucin o el pao. Solicite ayuda inmediatamente si:  Los ojos se ponen muy rojos o Cytogeneticist.  Siente picazn intensa en los ojos.  Siente picazn intensa en la piel y est roja o hinchada.  Nota cambios en su audicin.  Tiene dificultad para ver.  Tiene hinchazn u hormigueo en la garganta o la boca.  Tiene dificultad para respirar.  Traga un poco de clorhexidina. Resumen  El gluconato de clorhexidina (CHG) es una solucin desinfectante (antisptica) que se utiliza para limpiar la piel. Limpiar la piel con CHG puede ayudar a disminuir el riesgo de infeccin.  Es posible que le indiquen que utilice CHG para baarse. Esta solucin puede venir en botella o en un pao preenvasado para que use sobre su piel. Siga cuidadosamente las instrucciones del mdico y las instrucciones que se encuentran en la etiqueta del  producto.  No use CHG si es alrgico a la clorhexidina.  Pngase en contacto con su mdico si se le irrita la piel luego de frotar. Esta informacin no tiene Marine scientist el consejo del mdico. Asegrese de hacerle al mdico cualquier pregunta que tenga. Document Released: 02/11/2012 Document Revised: 09/07/2018 Document Reviewed: 06/20/2017 Elsevier Patient Education  2020 [employer].

## 2018-12-20 NOTE — Progress Notes (Signed)
   12/20/18 1023  OBSTRUCTIVE SLEEP APNEA  Have you ever been diagnosed with sleep apnea through a sleep study? No  Do you snore loudly (loud enough to be heard through closed doors)?  1  Do you often feel tired, fatigued, or sleepy during the daytime (such as falling asleep during driving or talking to someone)? 0  Has anyone observed you stop breathing during your sleep? 0  Do you have, or are you being treated for high blood pressure? 1  BMI more than 35 kg/m2? 0  Age > 50 (1-yes) 1  Neck circumference greater than:Male 16 inches or larger, Male 17inches or larger? 0  Male Gender (Yes=1) 1  Obstructive Sleep Apnea Score 4  Score 5 or greater  Results sent to PCP

## 2018-12-21 ENCOUNTER — Telehealth: Payer: Self-pay | Admitting: *Deleted

## 2018-12-21 DIAGNOSIS — R9721 Rising PSA following treatment for malignant neoplasm of prostate: Secondary | ICD-10-CM | POA: Diagnosis not present

## 2018-12-21 LAB — SARS CORONAVIRUS 2 (TAT 6-24 HRS): SARS Coronavirus 2: NEGATIVE

## 2018-12-21 NOTE — Telephone Encounter (Signed)
Called pt, NA and VM not set up Patient needs to be scheduled for TCS with MAC with SLF

## 2018-12-22 ENCOUNTER — Other Ambulatory Visit: Payer: Self-pay | Admitting: *Deleted

## 2018-12-22 ENCOUNTER — Ambulatory Visit (HOSPITAL_COMMUNITY): Payer: Medicare Other | Admitting: Anesthesiology

## 2018-12-22 ENCOUNTER — Encounter (HOSPITAL_COMMUNITY): Payer: Self-pay | Admitting: *Deleted

## 2018-12-22 ENCOUNTER — Ambulatory Visit (HOSPITAL_COMMUNITY)
Admission: RE | Admit: 2018-12-22 | Discharge: 2018-12-22 | Disposition: A | Discharge status: Home / Self Care | Payer: Medicare Other | Attending: General Surgery | Admitting: General Surgery

## 2018-12-22 ENCOUNTER — Other Ambulatory Visit: Payer: Self-pay

## 2018-12-22 ENCOUNTER — Encounter (HOSPITAL_COMMUNITY)
Admission: RE | Disposition: A | Discharge status: Home / Self Care | Payer: Self-pay | Source: Home / Self Care | Attending: General Surgery

## 2018-12-22 DIAGNOSIS — I10 Essential (primary) hypertension: Secondary | ICD-10-CM | POA: Diagnosis not present

## 2018-12-22 DIAGNOSIS — F17210 Nicotine dependence, cigarettes, uncomplicated: Secondary | ICD-10-CM | POA: Diagnosis not present

## 2018-12-22 DIAGNOSIS — F329 Major depressive disorder, single episode, unspecified: Secondary | ICD-10-CM | POA: Diagnosis not present

## 2018-12-22 DIAGNOSIS — Z79899 Other long term (current) drug therapy: Secondary | ICD-10-CM | POA: Insufficient documentation

## 2018-12-22 DIAGNOSIS — Z1159 Encounter for screening for other viral diseases: Secondary | ICD-10-CM | POA: Diagnosis not present

## 2018-12-22 DIAGNOSIS — M1990 Unspecified osteoarthritis, unspecified site: Secondary | ICD-10-CM | POA: Diagnosis not present

## 2018-12-22 DIAGNOSIS — Z8546 Personal history of malignant neoplasm of prostate: Secondary | ICD-10-CM | POA: Diagnosis not present

## 2018-12-22 DIAGNOSIS — K4090 Unilateral inguinal hernia, without obstruction or gangrene, not specified as recurrent: Secondary | ICD-10-CM | POA: Insufficient documentation

## 2018-12-22 DIAGNOSIS — E7800 Pure hypercholesterolemia, unspecified: Secondary | ICD-10-CM | POA: Insufficient documentation

## 2018-12-22 DIAGNOSIS — Z139 Encounter for screening, unspecified: Secondary | ICD-10-CM

## 2018-12-22 DIAGNOSIS — D176 Benign lipomatous neoplasm of spermatic cord: Secondary | ICD-10-CM | POA: Diagnosis not present

## 2018-12-22 DIAGNOSIS — Z1211 Encounter for screening for malignant neoplasm of colon: Secondary | ICD-10-CM

## 2018-12-22 DIAGNOSIS — E119 Type 2 diabetes mellitus without complications: Secondary | ICD-10-CM | POA: Diagnosis not present

## 2018-12-22 DIAGNOSIS — Z7984 Long term (current) use of oral hypoglycemic drugs: Secondary | ICD-10-CM | POA: Insufficient documentation

## 2018-12-22 HISTORY — PX: INGUINAL HERNIA REPAIR: SHX194

## 2018-12-22 LAB — GLUCOSE, CAPILLARY
Glucose-Capillary: 241 mg/dL — ABNORMAL HIGH (ref 70–99)
Glucose-Capillary: 248 mg/dL — ABNORMAL HIGH (ref 70–99)

## 2018-12-22 SURGERY — REPAIR, HERNIA, INGUINAL, ADULT
Anesthesia: General | Site: Inguinal | Laterality: Right

## 2018-12-22 MED ORDER — FENTANYL CITRATE (PF) 100 MCG/2ML IJ SOLN
INTRAMUSCULAR | Status: DC | PRN
  Administered 2018-12-22: 50 ug via INTRAVENOUS

## 2018-12-22 MED ORDER — CHLORHEXIDINE GLUCONATE CLOTH 2 % EX PADS: 6.0000 | MEDICATED_PAD | Freq: Once | CUTANEOUS | Status: DC

## 2018-12-22 MED ORDER — KETOROLAC TROMETHAMINE 30 MG/ML IJ SOLN
15.0000 mg | Freq: Once | INTRAMUSCULAR | Status: AC
  Administered 2018-12-22: 15 mg via INTRAVENOUS
  Filled 2018-12-22: qty 1

## 2018-12-22 MED ORDER — ONDANSETRON HCL 4 MG/2ML IJ SOLN
INTRAMUSCULAR | Status: DC | PRN
  Administered 2018-12-22: 4 mg via INTRAVENOUS

## 2018-12-22 MED ORDER — SUCCINYLCHOLINE CHLORIDE 200 MG/10ML IV SOSY
PREFILLED_SYRINGE | INTRAVENOUS | Status: AC
  Filled 2018-12-22: qty 10

## 2018-12-22 MED ORDER — GLYCOPYRROLATE PF 0.2 MG/ML IJ SOSY
PREFILLED_SYRINGE | INTRAMUSCULAR | Status: AC
  Filled 2018-12-22: qty 1

## 2018-12-22 MED ORDER — CEFAZOLIN SODIUM-DEXTROSE 2-4 GM/100ML-% IV SOLN
2.0000 g | INTRAVENOUS | Status: AC
  Administered 2018-12-22: 09:00:00 2 g via INTRAVENOUS
  Filled 2018-12-22: qty 100

## 2018-12-22 MED ORDER — HYDROCODONE-ACETAMINOPHEN 5-325 MG PO TABS: 1.0000 | ORAL_TABLET | ORAL | 0 refills | Status: DC | PRN

## 2018-12-22 MED ORDER — PROPOFOL 10 MG/ML IV BOLUS
INTRAVENOUS | Status: AC
  Filled 2018-12-22: qty 20

## 2018-12-22 MED ORDER — SODIUM CHLORIDE 0.9 % IR SOLN
Status: DC | PRN
  Administered 2018-12-22: 1000 mL

## 2018-12-22 MED ORDER — PROMETHAZINE HCL 25 MG/ML IJ SOLN: 6.2500 mg | INTRAMUSCULAR | Status: DC | PRN

## 2018-12-22 MED ORDER — BUPIVACAINE LIPOSOME 1.3 % IJ SUSP
INTRAMUSCULAR | Status: AC
  Filled 2018-12-22: qty 20

## 2018-12-22 MED ORDER — GLYCOPYRROLATE 0.2 MG/ML IJ SOLN
INTRAMUSCULAR | Status: DC | PRN
  Administered 2018-12-22: 0.2 mg via INTRAVENOUS

## 2018-12-22 MED ORDER — HYDROMORPHONE HCL 1 MG/ML IJ SOLN
0.2500 mg | INTRAMUSCULAR | Status: DC | PRN
  Administered 2018-12-22: 0.5 mg via INTRAVENOUS
  Filled 2018-12-22: qty 0.5

## 2018-12-22 MED ORDER — CLENPIQ 10-3.5-12 MG-GM -GM/160ML PO SOLN: 1.0000 | Freq: Once | ORAL | 0 refills | Status: AC

## 2018-12-22 MED ORDER — EPHEDRINE SULFATE 50 MG/ML IJ SOLN
INTRAMUSCULAR | Status: DC | PRN
  Administered 2018-12-22 (×2): 10 mg via INTRAVENOUS

## 2018-12-22 MED ORDER — PROPOFOL 10 MG/ML IV BOLUS
INTRAVENOUS | Status: DC | PRN
  Administered 2018-12-22: 20 mg via INTRAVENOUS
  Administered 2018-12-22: 180 mg via INTRAVENOUS
  Administered 2018-12-22: 40 mg via INTRAVENOUS

## 2018-12-22 MED ORDER — LACTATED RINGERS IV SOLN
INTRAVENOUS | Status: DC | PRN
  Administered 2018-12-22: 09:00:00 via INTRAVENOUS

## 2018-12-22 MED ORDER — FENTANYL CITRATE (PF) 100 MCG/2ML IJ SOLN
INTRAMUSCULAR | Status: AC
  Filled 2018-12-22: qty 2

## 2018-12-22 MED ORDER — LACTATED RINGERS IV SOLN: INTRAVENOUS | Status: DC

## 2018-12-22 MED ORDER — BUPIVACAINE LIPOSOME 1.3 % IJ SUSP
INTRAMUSCULAR | Status: DC | PRN
  Administered 2018-12-22: 20 mL

## 2018-12-22 MED ORDER — MIDAZOLAM HCL 2 MG/2ML IJ SOLN: 0.5000 mg | Freq: Once | INTRAMUSCULAR | Status: DC | PRN

## 2018-12-22 MED ORDER — LIDOCAINE HCL (CARDIAC) PF 50 MG/5ML IV SOSY
PREFILLED_SYRINGE | INTRAVENOUS | Status: DC | PRN
  Administered 2018-12-22: 40 mg via INTRAVENOUS

## 2018-12-22 MED ORDER — LIDOCAINE 2% (20 MG/ML) 5 ML SYRINGE
INTRAMUSCULAR | Status: AC
  Filled 2018-12-22: qty 10

## 2018-12-22 MED ORDER — DEXAMETHASONE SODIUM PHOSPHATE 4 MG/ML IJ SOLN
INTRAMUSCULAR | Status: DC | PRN
  Administered 2018-12-22: 8 mg via INTRAVENOUS

## 2018-12-22 MED ORDER — HYDROCODONE-ACETAMINOPHEN 7.5-325 MG PO TABS
1.0000 | ORAL_TABLET | Freq: Once | ORAL | Status: AC | PRN
  Administered 2018-12-22: 1 via ORAL
  Filled 2018-12-22: qty 1

## 2018-12-22 SURGICAL SUPPLY — 32 items
CLOTH BEACON ORANGE TIMEOUT ST (SAFETY) ×3 IMPLANT
COVER LIGHT HANDLE STERIS (MISCELLANEOUS) ×6 IMPLANT
DERMABOND ADVANCED (GAUZE/BANDAGES/DRESSINGS) ×2
DERMABOND ADVANCED .7 DNX12 (GAUZE/BANDAGES/DRESSINGS) ×1 IMPLANT
DRAIN PENROSE 18X1/2 LTX STRL (DRAIN) ×3 IMPLANT
ELECT REM PT RETURN 9FT ADLT (ELECTROSURGICAL) ×3
ELECTRODE REM PT RTRN 9FT ADLT (ELECTROSURGICAL) ×1 IMPLANT
GAUZE SPONGE 4X4 12PLY STRL (GAUZE/BANDAGES/DRESSINGS) ×3 IMPLANT
GLOVE BIOGEL PI IND STRL 7.0 (GLOVE) ×2 IMPLANT
GLOVE BIOGEL PI INDICATOR 7.0 (GLOVE) ×6
GLOVE SURG SS PI 7.5 STRL IVOR (GLOVE) ×3 IMPLANT
GOWN STRL REUS W/TWL LRG LVL3 (GOWN DISPOSABLE) ×9 IMPLANT
INST SET MINOR GENERAL (KITS) ×3 IMPLANT
KIT TURNOVER KIT A (KITS) ×3 IMPLANT
MANIFOLD NEPTUNE II (INSTRUMENTS) ×3 IMPLANT
MESH HERNIA 1.6X1.9 PLUG LRG (Mesh General) IMPLANT
MESH HERNIA PLUG LRG (Mesh General) ×2 IMPLANT
NDL HYPO 21X1.5 SAFETY (NEEDLE) ×1 IMPLANT
NEEDLE HYPO 21X1.5 SAFETY (NEEDLE) ×3 IMPLANT
NS IRRIG 1000ML POUR BTL (IV SOLUTION) ×3 IMPLANT
PACK MINOR (CUSTOM PROCEDURE TRAY) ×3 IMPLANT
PAD ARMBOARD 7.5X6 YLW CONV (MISCELLANEOUS) ×3 IMPLANT
SET BASIN LINEN APH (SET/KITS/TRAYS/PACK) ×3 IMPLANT
SOL PREP PROV IODINE SCRUB 4OZ (MISCELLANEOUS) ×3 IMPLANT
SUT MNCRL AB 4-0 PS2 18 (SUTURE) ×3 IMPLANT
SUT NOVA NAB GS-22 2 2-0 T-19 (SUTURE) ×6 IMPLANT
SUT VIC AB 2-0 CT1 27 (SUTURE) ×2
SUT VIC AB 2-0 CT1 TAPERPNT 27 (SUTURE) ×1 IMPLANT
SUT VIC AB 3-0 SH 27 (SUTURE) ×2
SUT VIC AB 3-0 SH 27X BRD (SUTURE) ×1 IMPLANT
SUT VICRYL AB 3 0 TIES (SUTURE) ×2 IMPLANT
SYR 20CC LL (SYRINGE) ×3 IMPLANT

## 2018-12-22 NOTE — Anesthesia Postprocedure Evaluation (Signed)
Anesthesia Post Note  Patient: Chase [name]  Procedure(s) Performed: HERNIA REPAIR INGUINAL ADULT WITH MESH (Right Inguinal)  Patient location during evaluation: PACU Anesthesia Type: General Level of consciousness: awake and alert, oriented and patient cooperative Pain management: pain level controlled Vital Signs Assessment: post-procedure vital signs reviewed and stable Respiratory status: spontaneous breathing Cardiovascular status: stable Postop Assessment: no apparent nausea or vomiting Anesthetic complications: no     Last Vitals:  Vitals:   12/22/18 1000 12/22/18 1015  BP: (!) 144/86 130/81  Pulse: 92 65  Resp: 19 14  Temp:    SpO2: 91% 96%    Last Pain:  Vitals:   12/22/18 1008  TempSrc:   PainSc: 4                  ADAMS, AMY A

## 2018-12-22 NOTE — Anesthesia Preprocedure Evaluation (Signed)
Anesthesia Evaluation  Patient identified by MRN, date of birth, ID band Patient awake    Reviewed: Allergy & Precautions, NPO status , Patient's Chart, lab work & pertinent test results  Airway Mallampati: II  TM Distance: >3 FB Neck ROM: Full    Dental no notable dental hx. (+) Teeth Intact   Pulmonary neg pulmonary ROS, former smoker,    Pulmonary exam normal breath sounds clear to auscultation       Cardiovascular Exercise Tolerance: Poor hypertension, Pt. on medications negative cardio ROS Normal cardiovascular examII Rhythm:Regular Rate:Normal  Pt denies any cardiac issues  ET limited by LBP/s/p back surgeries    Neuro/Psych  Headaches, PSYCHIATRIC DISORDERS Depression  Neuromuscular disease    GI/Hepatic negative GI ROS, Neg liver ROS,   Endo/Other  negative endocrine ROSdiabetes  Renal/GU negative Renal ROS  negative genitourinary   Musculoskeletal  (+) Arthritis , Osteoarthritis,    Abdominal   Peds negative pediatric ROS (+)  Hematology negative hematology ROS (+)   Anesthesia Other Findings H/o prostate Ca -treated   Reproductive/Obstetrics negative OB ROS                             Anesthesia Physical Anesthesia Plan  ASA: II  Anesthesia Plan: General   Post-op Pain Management:    Induction: Intravenous  PONV Risk Score and Plan: 2 and Ondansetron, Dexamethasone and Treatment may vary due to age or medical condition  Airway Management Planned: LMA  Additional Equipment:   Intra-op Plan:   Post-operative Plan: Extubation in OR  Informed Consent: I have reviewed the patients History and Physical, chart, labs and discussed the procedure including the risks, benefits and alternatives for the proposed anesthesia with the patient or authorized representative who has indicated his/her understanding and acceptance.     Dental advisory given  Plan Discussed with:  CRNA  Anesthesia Plan Comments: (Plan Full PPE use  Plan GA (LMA) with GETA as needed  WTP with same after Q&A in spanish - pt denied the need for language line when questioned )        Anesthesia Quick Evaluation

## 2018-12-22 NOTE — Interval H&P Note (Signed)
History and Physical Interval Note:  12/22/2018 8:07 AM  Chase [name]  has presented today for surgery, with the diagnosis of right inguinal hernia.  The various methods of treatment have been discussed with the patient and family. After consideration of risks, benefits and other options for treatment, the patient has consented to  Procedure(s): HERNIA REPAIR INGUINAL ADULT WITH MESH (Right) as a surgical intervention.  The patient's history has been reviewed, patient examined, no change in status, stable for surgery.  I have reviewed the patient's chart and labs.  Questions were answered to the patient's satisfaction.     Aviva Signs

## 2018-12-22 NOTE — Transfer of Care (Signed)
Immediate Anesthesia Transfer of Care Note  Patient: Chase [name]  Procedure(s) Performed: HERNIA REPAIR INGUINAL ADULT WITH MESH (Right Inguinal)  Patient Location: PACU  Anesthesia Type:General  Level of Consciousness: awake, alert , oriented and patient cooperative  Airway & Oxygen Therapy: Patient Spontanous Breathing  Post-op Assessment: Report given to RN and Post -op Vital signs reviewed and stable  Post vital signs: Reviewed and stable  Last Vitals:  Vitals Value Taken Time  BP 123/86 12/22/18 0940  Temp    Pulse 76 12/22/18 0942  Resp 9 12/22/18 0942  SpO2 98 % 12/22/18 0942  Vitals shown include unvalidated device data.  Last Pain:  Vitals:   12/22/18 0755  TempSrc: Oral  PainSc: 2          Complications: No apparent anesthesia complications

## 2018-12-22 NOTE — Telephone Encounter (Signed)
Called pt, VM not set up

## 2018-12-22 NOTE — Discharge Instructions (Signed)
Reparacin abierta de una hernia en los adultos, cuidados posteriores Open Hernia Repair, Adult, Care After Estas indicaciones le proporcionan informacin acerca de cmo deber cuidarse despus del procedimiento. El mdico tambin podr darle indicaciones ms especficas. Si tiene problemas o preguntas, llame al mdico. Siga estas indicaciones en su casa: Cuidado del corte quirrgico (incisin)   Siga las indicaciones del mdico en lo que respecta al cuidado de la zona del corte quirrgico. [place] lo siguiente: ? Lvese las manos con agua y jabn antes de Quarry manager las vendas (vendaje). Use un desinfectante para manos si no dispone de [place]. ? Cambie el vendaje como se lo haya indicado el mdico. ? No retire los puntos (suturas), la goma para cerrar la piel o las tiras [place]. Tal vez deban dejarse puestos en la piel durante 2semanas o ms tiempo. Si las tiras [place] se despegan y se enroscan, puede recortar los bordes sueltos. No retire las tiras [facility] por completo a menos que el mdico lo autorice.  Controle el corte quirrgico todos los das para detectar signos de infeccin. Est atento a los siguientes signos: ? Aumento del enrojecimiento, de la hinchazn o del dolor. ? Ms lquido Delorise Shiner. ? Calor. ? Pus o mal olor. Actividad  No conduzca ni use maquinaria pesada mientras toma analgsicos recetados. No conduzca hasta que el mdico lo autorice.  Hasta que su mdico lo autorice: ? No levante ningn objeto que pese ms de 10libras (4,5kg). ? No practique deportes de contacto.  Retome sus actividades habituales como se lo haya indicado el mdico. Pregntele al mdico qu actividades son seguras para usted. Instrucciones generales  A fin de prevenir o tratar el estreimiento mientras toma analgsicos recetados, el mdico puede recomendarle lo siguiente: ? Beber suficiente lquido para mantener el pis (orina) claro o de color amarillo plido. ? Tomar medicamentos  recetados o de [employer]. ? Consumir alimentos ricos en fibra, como frutas y verduras frescas, cereales integrales y frijoles. ? Limitar el consumo de alimentos con alto contenido de grasas y azcares procesados, como alimentos fritos o dulces.  Tome los medicamentos de venta libre y los recetados solamente como se lo haya indicado el mdico.  No tome baos de inmersin, no practique natacin ni use el jacuzzi hasta que el mdico lo autorice.  Concurra a todas las visitas de control como se lo haya indicado el mdico. Esto es importante. Comunquese con un mdico si:  Le aparece una erupcin cutnea.  Tiene ms enrojecimiento, hinchazn o dolor alrededor del corte quirrgico.  Aumenta la cantidad de lquido o sangre que sale del corte.  El corte est caliente al tacto.  Tiene pus o percibe mal olor que emana del corte quirrgico.  Tiene fiebre o siente escalofros.  Observa sangre en las heces (materia fecal).  No ha defecado en 2 o 3das.  Los medicamentos no Forensic psychologist. Solicite ayuda de inmediato si:  Electronics engineer o le falta el aire.  Tiene sensacin de desvanecimiento.  Se siente dbil y mareado (sensacin de desvanecimiento).  Siente Geophysical data processor.  Vomita y Printmaker. Esta informacin no tiene Marine scientist el consejo del mdico. Asegrese de hacerle al mdico cualquier pregunta que tenga. Document Released: 06/09/2014 Document Revised: 08/22/2016 Document Reviewed: 10/31/2015 Elsevier Patient Education  2020 [employer].

## 2018-12-22 NOTE — Anesthesia Procedure Notes (Signed)
Procedure Name: LMA Insertion Date/Time: 12/22/2018 8:46 AM Performed by: Andree Elk, Amy A, CRNA Patient Re-evaluated:Patient Re-evaluated prior to induction Oxygen Delivery Method: Circle system utilized Preoxygenation: Pre-oxygenation with 100% oxygen Induction Type: IV induction LMA Size: 4.0 Number of attempts: 1 Placement Confirmation: positive ETCO2 and breath sounds checked- equal and bilateral Tube secured with: Tape Dental Injury: Teeth and Oropharynx as per pre-operative assessment

## 2018-12-22 NOTE — Telephone Encounter (Addendum)
Patient spouse Verdis Frederickson called back as interpreter with patient. He is scheduled for procedure 10/20 at 8:30am. Made aware I will mail prep instructions with pre-op appt (confirmed mailing address). Also aware to pick [facility] onto. Orders entered

## 2018-12-22 NOTE — Op Note (Signed)
Patient:  Chase [name]  DOB:  [birth year]  MRN:  [id number]   Preop Diagnosis: Right inguinal hernia  Postop Diagnosis: Same  Procedure: Right inguinal herniorrhaphy with mesh  Surgeon: Aviva Signs, MD  Anes: General  Indications: Patient is a 68 year old Hispanic male who presents with a symptomatic right inguinal hernia.  The risks and benefits of the procedure including bleeding, infection, mesh use, the possibility of recurrence of the hernia were fully explained to the patient through an interpreter, who gave informed consent.  Procedure note: The patient was placed in supine position.  After general anesthesia was administered, the right groin region was prepped and draped using the usual sterile technique with Betadine.  Surgical site confirmation was performed.  An incision was made in the right groin region down to the external oblique aponeurosis.  The aponeurosis was incised to the external ring.  A Penrose drain was placed around the spermatic cord.  The vas deferens was noted within the spermatic cord.  A lipoma of the cord was found and a high ligation was performed using a 3-0 Vicryl tie.  The lipoma was disposed of.  The patient had an indirect hernia which was freed away from the spermatic cord up to the peritoneal reflection and reduced.  A large Bard PerFix plug was then inserted.  An onlay patch was placed along the floor of the inguinal canal and secured superiorly to the conjoined tendon and inferiorly to the shelving edge of Poupart's ligament using 2-0 Novafil interrupted sutures.  The internal ring was re-created using a 2-0 Novafil interrupted suture.  Care was taken to avoid the ilioinguinal nerve.  The external oblique aponeurosis was reapproximated using a 2-0 Vicryl running suture.  The subcutaneous layer was reapproximated using a 3-0 Vicryl interrupted suture.  Exparel was instilled into the surrounding wound.  The skin was closed using a 4-0 Monocryl  subcuticular suture.  Dermabond was applied.  All tape and needle counts were correct at the end of the procedure.  Patient was awakened and transferred to PACU in stable condition.  Complications: None  EBL: Minimal  Specimen: None

## 2018-12-23 ENCOUNTER — Encounter (HOSPITAL_COMMUNITY): Payer: Self-pay | Admitting: General Surgery

## 2018-12-27 NOTE — Telephone Encounter (Signed)
Pre-op appt mailed 

## 2018-12-28 ENCOUNTER — Telehealth (INDEPENDENT_AMBULATORY_CARE_PROVIDER_SITE_OTHER): Payer: Self-pay | Admitting: General Surgery

## 2018-12-28 DIAGNOSIS — Z09 Encounter for follow-up examination after completed treatment for conditions other than malignant neoplasm: Secondary | ICD-10-CM

## 2018-12-28 NOTE — Telephone Encounter (Signed)
Telephone visit with patient's daughter, Verdis Frederickson.  She states he is doing well and has no problems.  Told to return or call office should any problems arise.  Increase activity as able.

## 2019-01-27 DIAGNOSIS — R9721 Rising PSA following treatment for malignant neoplasm of prostate: Secondary | ICD-10-CM | POA: Diagnosis not present

## 2019-02-03 DIAGNOSIS — C61 Malignant neoplasm of prostate: Secondary | ICD-10-CM | POA: Diagnosis not present

## 2019-02-14 ENCOUNTER — Other Ambulatory Visit: Payer: Self-pay | Admitting: Family Medicine

## 2019-02-15 ENCOUNTER — Other Ambulatory Visit: Payer: Self-pay | Admitting: Family Medicine

## 2019-02-16 ENCOUNTER — Ambulatory Visit (INDEPENDENT_AMBULATORY_CARE_PROVIDER_SITE_OTHER): Payer: Medicare Other | Admitting: Family Medicine

## 2019-02-16 NOTE — Progress Notes (Signed)
Attempted to call the patient and left a voicemail

## 2019-03-17 ENCOUNTER — Encounter (HOSPITAL_COMMUNITY): Payer: Self-pay

## 2019-03-17 ENCOUNTER — Other Ambulatory Visit: Payer: Self-pay

## 2019-03-17 NOTE — Patient Instructions (Signed)
Instrucciones Para Antes de la Ciruga   Su ciruga est programada para-(your procedure is scheduled on)  03/22/2019   Chase [name]     Por favor llame al [phone] si tiene algn problema en la maana de la ciruga.                   Recuerde DO NOT take any medications for diabetes the morning of your procedure.   Follow the diet and prep instructions given to you by Dr Artis Flock office.   [employer] medicinas en la maana de la ciruga con un SORBITO de agua - cymbalta, hydrocodone(if needed), flomax. Use your inhaler before you come.   Puede cepillarse los dientes en la maana de la [place]. (you may brush your teeth the morning of surgery)   No use joyas, maquillaje de ojos, lpiz labial, crema para el cuerpo o esmalte de uas oscuro. (Do not wear jewelry, eye makeup, lipstick, body lotion, or dark fingernail polish)   No puede usar desodorante. (you may wear deodorant)   Si va a ser ingresado despues de la ciruga, deje la maleta en el carro hasta que se le haya asignado una habitacin. (If you are to be admitted after surgery, leave suitcase in car until your room has been assigned.)   A los pacientes que se les d de alta el mismo da no se les permitir manejar a casa.  (Patients discharged on the day of surgery will not be allowed to drive home)   Use ropa suelta y cmoda de regreso a casa. (wear loose comfortable clothes for ride home)     Biopsia de colon, cuidados posteriores Colon Biopsy, Care After Lea esta informacin sobre cmo cuidarse despus del procedimiento. Su mdico tambin podr darle indicaciones ms especficas. Comunquese con su mdico si tiene problemas o preguntas. Qu puedo esperar despus del procedimiento? Despus del procedimiento, es comn [employer] siguientes sntomas:  Una pequea cantidad de sangre en las heces durante 24horas despus del procedimiento.  Gases.  Clicos o  meteorismo leves en el abdomen. Siga estas indicaciones en su casa: Instrucciones generales  Durante las primeras 24horas despus del procedimiento: ? No conduzca ni opere maquinaria. ? No firme documentos importantes. ? No beba alcohol. ? Realice las actividades cotidianas habituales a un ritmo ms lento que el normal. ? Coma alimentos blandos y fciles de Publishing copy. ? Descanse con frecuencia.  Tome los medicamentos de venta libre y los recetados solamente como se lo haya indicado el mdico.  Consulting civil engineer a todas las visitas de control como se lo haya indicado el mdico. Esto es importante. Alivio de los clicos y del meteorismo   Cuando tenga clicos o se sienta hinchado, intente caminar por la casa.  Aplquese calor en el abdomen como se lo haya indicado el mdico. Use la fuente de calor que el mdico le recomiende, como una compresa de calor hmedo o una almohadilla trmica. ? Coloque una [employer] piel y la fuente de Freight forwarder. ? Aplique el calor durante 20 a 47minutos. ? Retire la fuente de calor si la piel se pone de color rojo brillante. Esto es muy importante si no puede Education officer, environmental, calor o fro. Puede correr un  riesgo mayor de sufrir quemaduras. Comida y bebida  Bebe suficiente lquido para Theatre manager la orina de color amarillo plido.  Reanude su dieta normal como se lo haya indicado el mdico. Evite los alimentos pesados o fritos que son difciles de Publishing copy.  Evite consumir alcohol durante el tiempo que le haya indicado el mdico. Comunquese con un mdico si:  Tiene sangre en las heces 2 o 3das despus del procedimiento. Solicite ayuda de inmediato si:  Tiene ms que una pequea mancha de [employer].  Elimina grandes cogulos de [employer].  Tiene el abdomen inflamado.  Tiene nuseas o vmitos.  Tiene fiebre.  Siente cada vez ms dolor en el abdomen y no se alivia con los [employer]. Resumen  Despus del procedimiento, es frecuente  sentir clicos y meteorismo leves en el abdomen.  No conduzca durante 24horas despus del procedimiento.  Cuando tenga clicos o se sienta hinchado, intente caminar por la casa. Esta informacin no tiene Marine scientist el consejo del mdico. Asegrese de hacerle al mdico cualquier pregunta que tenga. Document Released: 02/23/2017 Document Revised: 02/23/2017 Document Reviewed: 02/23/2017 Elsevier Patient Education  2020 [place], cuidados posteriores Monitored Anesthesia Care, Care After Estas indicaciones le proporcionan informacin acerca de cmo deber cuidarse despus de su procedimiento. El mdico tambin podr darle instrucciones ms especficas. Su tratamiento ha sido planificado segn las prcticas mdicas actuales, Armed forces training and education officer en algunos casos pueden ocurrir problemas. Comunquese con el mdico si tiene algn problema o preguntas despus del procedimiento. Qu puedo esperar despus del procedimiento? Despus del procedimiento, puede:  Sentirse somnoliento durante varias horas.  Sentirse torpe y [employer] de equilibrio durante varias horas.  Olvidarse de lo que sucedi despus del procedimiento.  Perder el sentido de la realidad durante varias horas.  Sentirse nauseoso o vomitar.  Tener dolor de garganta si le colocaron un tubo respiratorio durante el procedimiento. Siga estas indicaciones en su casa: Durante al menos 24horas despus del procedimiento:      Pdale a un adulto responsable que permanezca con usted. Es importante que alguien cuide de usted hasta que se despierte y Cabin crew.  Descanse todo lo que sea necesario.  No haga lo siguiente: ? Participar en actividades en las que podra caerse o lastimarse. ? Conducir. ? Operar maquinarias pesadas. ? Beber alcohol. ? Tomar somnferos o medicamentos que causen somnolencia. ? Firmar documentos legales ni tomar [employer]. ? Cuidar a nios por su cuenta. Qu debe  comer y beber  Siga la dieta recomendada por el mdico.  Si vomita, tome agua, jugo o sopa cuando pueda beber sin vomitar.  Asegrese de no tener nuseas antes de ingerir alimentos slidos. Instrucciones generales  [employer] de venta libre y los recetados solamente como se lo haya indicado el mdico.  Si tiene apnea del sueo, la [place] y ciertos medicamentos pueden aumentar el riesgo de problemas respiratorios. Siga las indicaciones del mdico respecto al uso de su dispositivo para dormir: ? Siempre que duerma, incluso durante las siestas que tome en el da. ? Mientras tome analgsicos recetados, medicamentos para dormir o medicamentos que producen somnolencia.  Si fuma, no lo haga sin supervisin.  Concurra a todas las visitas de seguimiento como se lo haya indicado el mdico. Esto es importante. Comunquese con un mdico si:  Sigue teniendo nuseas o vomitando.  Siente que va a desvanecerse.  Presenta una erupcin cutnea.  Tiene fiebre. Solicite ayuda de inmediato si:  Tiene dificultad para respirar. Resumen  Tal vez se sienta adormecido y pierda el sentido de la realidad durante varias horas despus del procedimiento.  Pdale a un adulto responsable que permanezca con usted al menos por 24 horas o hasta que est despierto y consciente. Esta informacin no tiene Marine scientist el consejo del mdico. Asegrese de hacerle al mdico cualquier pregunta que tenga. Document Released: 01/24/2016 Document Revised: 08/17/2017 Document Reviewed: 09/09/2015 Elsevier Patient Education  2020 [employer].

## 2019-03-18 ENCOUNTER — Other Ambulatory Visit (HOSPITAL_COMMUNITY)
Admission: RE | Admit: 2019-03-18 | Discharge: 2019-03-18 | Disposition: A | Discharge status: Home / Self Care | Payer: Medicare Other | Source: Ambulatory Visit | Attending: Gastroenterology | Admitting: Gastroenterology

## 2019-03-18 ENCOUNTER — Other Ambulatory Visit: Payer: Self-pay

## 2019-03-18 ENCOUNTER — Encounter (HOSPITAL_COMMUNITY)
Admission: RE | Admit: 2019-03-18 | Discharge: 2019-03-18 | Disposition: A | Discharge status: Home / Self Care | Payer: Medicare Other | Source: Ambulatory Visit | Attending: Gastroenterology | Admitting: Gastroenterology

## 2019-03-18 DIAGNOSIS — Z20828 Contact with and (suspected) exposure to other viral communicable diseases: Secondary | ICD-10-CM | POA: Diagnosis not present

## 2019-03-18 DIAGNOSIS — Z20822 Contact with and (suspected) exposure to covid-19: Secondary | ICD-10-CM

## 2019-03-19 LAB — NOVEL CORONAVIRUS, NAA: SARS-CoV-2, NAA: NOT DETECTED

## 2019-03-21 ENCOUNTER — Telehealth: Payer: Self-pay | Admitting: Gastroenterology

## 2019-03-21 ENCOUNTER — Other Ambulatory Visit: Payer: Self-pay

## 2019-03-21 MED ORDER — PEG 3350-KCL-NA BICARB-NACL 420 G PO SOLR: 4000.0000 mL | ORAL | 0 refills | Status: DC

## 2019-03-21 MED ORDER — CLENPIQ 10-3.5-12 MG-GM -GM/160ML PO SOLN: 1.0000 | Freq: Once | ORAL | 0 refills | Status: AC

## 2019-03-21 NOTE — Telephone Encounter (Signed)
Rx resent to pharmacy. Daughter answered his phone, informed her.

## 2019-03-21 NOTE — Telephone Encounter (Signed)
Pt is scheduled procedure with SF tomorrow and the pharmacy doesn't have his prep. He uses [facility] in [place].

## 2019-03-21 NOTE — Telephone Encounter (Signed)
Pt's daughter called office, pharmacy is out of Clenpiq. Rx for Tri-Lyte sent to pharmacy d/t procedure is tomorrow. Daughter aware he will need fleet enema and Dulcolax tabs. Will need to take Dulcolax tabs at 2:00pm today. New instructions faxed to pharmacy-daughter aware. She can help him read english instructions.

## 2019-03-22 ENCOUNTER — Encounter (HOSPITAL_COMMUNITY): Payer: Self-pay | Admitting: *Deleted

## 2019-03-22 ENCOUNTER — Ambulatory Visit (HOSPITAL_COMMUNITY)
Admission: RE | Admit: 2019-03-22 | Discharge: 2019-03-22 | Disposition: A | Discharge status: Home / Self Care | Payer: Medicare Other | Attending: Gastroenterology | Admitting: Gastroenterology

## 2019-03-22 ENCOUNTER — Ambulatory Visit (HOSPITAL_COMMUNITY): Payer: Medicare Other | Admitting: Anesthesiology

## 2019-03-22 ENCOUNTER — Encounter (HOSPITAL_COMMUNITY)
Admission: RE | Disposition: A | Discharge status: Home / Self Care | Payer: Self-pay | Source: Home / Self Care | Attending: Gastroenterology

## 2019-03-22 ENCOUNTER — Other Ambulatory Visit: Payer: Self-pay

## 2019-03-22 DIAGNOSIS — E119 Type 2 diabetes mellitus without complications: Secondary | ICD-10-CM | POA: Diagnosis not present

## 2019-03-22 DIAGNOSIS — Q439 Congenital malformation of intestine, unspecified: Secondary | ICD-10-CM | POA: Insufficient documentation

## 2019-03-22 DIAGNOSIS — D128 Benign neoplasm of rectum: Secondary | ICD-10-CM | POA: Diagnosis not present

## 2019-03-22 DIAGNOSIS — Z87891 Personal history of nicotine dependence: Secondary | ICD-10-CM | POA: Insufficient documentation

## 2019-03-22 DIAGNOSIS — D123 Benign neoplasm of transverse colon: Secondary | ICD-10-CM | POA: Insufficient documentation

## 2019-03-22 DIAGNOSIS — D120 Benign neoplasm of cecum: Secondary | ICD-10-CM | POA: Insufficient documentation

## 2019-03-22 DIAGNOSIS — K621 Rectal polyp: Secondary | ICD-10-CM | POA: Diagnosis not present

## 2019-03-22 DIAGNOSIS — E7800 Pure hypercholesterolemia, unspecified: Secondary | ICD-10-CM | POA: Diagnosis not present

## 2019-03-22 DIAGNOSIS — Z1211 Encounter for screening for malignant neoplasm of colon: Secondary | ICD-10-CM | POA: Diagnosis not present

## 2019-03-22 DIAGNOSIS — Z7984 Long term (current) use of oral hypoglycemic drugs: Secondary | ICD-10-CM | POA: Diagnosis not present

## 2019-03-22 DIAGNOSIS — I10 Essential (primary) hypertension: Secondary | ICD-10-CM | POA: Insufficient documentation

## 2019-03-22 DIAGNOSIS — K648 Other hemorrhoids: Secondary | ICD-10-CM | POA: Insufficient documentation

## 2019-03-22 DIAGNOSIS — K635 Polyp of colon: Secondary | ICD-10-CM | POA: Diagnosis not present

## 2019-03-22 DIAGNOSIS — Z79899 Other long term (current) drug therapy: Secondary | ICD-10-CM | POA: Diagnosis not present

## 2019-03-22 HISTORY — PX: COLONOSCOPY WITH PROPOFOL: SHX5780

## 2019-03-22 HISTORY — PX: POLYPECTOMY: SHX5525

## 2019-03-22 LAB — GLUCOSE, CAPILLARY
Glucose-Capillary: 195 mg/dL — ABNORMAL HIGH (ref 70–99)
Glucose-Capillary: 206 mg/dL — ABNORMAL HIGH (ref 70–99)

## 2019-03-22 SURGERY — COLONOSCOPY WITH PROPOFOL
Anesthesia: General

## 2019-03-22 MED ORDER — CHLORHEXIDINE GLUCONATE CLOTH 2 % EX PADS: 6.0000 | MEDICATED_PAD | Freq: Once | CUTANEOUS | Status: DC

## 2019-03-22 MED ORDER — PHENYLEPHRINE HCL (PRESSORS) 10 MG/ML IV SOLN
INTRAVENOUS | Status: DC | PRN
  Administered 2019-03-22 (×5): 100 ug via INTRAVENOUS

## 2019-03-22 MED ORDER — HYDROMORPHONE HCL 1 MG/ML IJ SOLN: 0.2500 mg | INTRAMUSCULAR | Status: DC | PRN

## 2019-03-22 MED ORDER — PROPOFOL 10 MG/ML IV BOLUS
INTRAVENOUS | Status: AC
  Filled 2019-03-22: qty 20

## 2019-03-22 MED ORDER — MIDAZOLAM HCL 2 MG/2ML IJ SOLN: 0.5000 mg | Freq: Once | INTRAMUSCULAR | Status: DC | PRN

## 2019-03-22 MED ORDER — MIDAZOLAM HCL 2 MG/2ML IJ SOLN
INTRAMUSCULAR | Status: AC
  Filled 2019-03-22: qty 2

## 2019-03-22 MED ORDER — HYDROCODONE-ACETAMINOPHEN 7.5-325 MG PO TABS: 1.0000 | ORAL_TABLET | Freq: Once | ORAL | Status: DC | PRN

## 2019-03-22 MED ORDER — LACTATED RINGERS IV SOLN
INTRAVENOUS | Status: DC
  Administered 2019-03-22: 08:00:00 via INTRAVENOUS

## 2019-03-22 MED ORDER — PROMETHAZINE HCL 25 MG/ML IJ SOLN: 6.2500 mg | INTRAMUSCULAR | Status: DC | PRN

## 2019-03-22 MED ORDER — PROPOFOL 500 MG/50ML IV EMUL
INTRAVENOUS | Status: DC | PRN
  Administered 2019-03-22: 120 ug/kg/min via INTRAVENOUS

## 2019-03-22 MED ORDER — MIDAZOLAM HCL 5 MG/5ML IJ SOLN
INTRAMUSCULAR | Status: DC | PRN
  Administered 2019-03-22: 2 mg via INTRAVENOUS

## 2019-03-22 MED ORDER — PHENYLEPHRINE 40 MCG/ML (10ML) SYRINGE FOR IV PUSH (FOR BLOOD PRESSURE SUPPORT)
PREFILLED_SYRINGE | INTRAVENOUS | Status: AC
  Filled 2019-03-22: qty 20

## 2019-03-22 NOTE — H&P (Addendum)
Primary Care Physician:  Dettinger, Fransisca Kaufmann, MD Primary Gastroenterologist:  Dr. Jannifer Franklin SPEAKING ONLY-HISTORY OBTAINED VIA INTERPRETER: [phone]  Pre-Procedure History & Physical: HPI:  Chase [name] is a 68 y.o. male here for COLON CANCER SCREENING.  Past Medical History:  Diagnosis Date  . Arthritis   . Cancer [facility])    prostate 2017  . Depression   . Diabetes mellitus without complication ([place])   . Headache   . High cholesterol   . Hypertension   . Inguinal hernia    rt  . Neck pain   . Numbness and tingling in hands   . Urinary frequency     Past Surgical History:  Procedure Laterality Date  . ANTERIOR CERVICAL DECOMP/DISCECTOMY FUSION N/A 04/04/2015   Procedure: ANTERIOR CERVICAL DECOMPRESSION/DISCECTOMY FUSION PLATING BONEGRAFT CERVICAL FOUR-FIVE CERVICAL FIVE-SIX;  Surgeon: Ashok Pall, MD;  Location: [place] [facility];  Service: Neurosurgery;  Laterality: N/A;  . BACK SURGERY     lower back with Dr. Christella Noa  . INGUINAL HERNIA REPAIR Right 12/22/2018   Procedure: HERNIA REPAIR INGUINAL ADULT WITH MESH;  Surgeon: Aviva Signs, MD;  Location: [facility];  Service: General;  Laterality: Right;  . SHOULDER ARTHROSCOPY Right   . UPPER EXTREMITY VENOGRAPHY N/A 07/22/2016   Procedure: Upper Extremity Venography - bilateral arm;  Surgeon: Serafina Mitchell, MD;  Location: [facility];  Service: Cardiovascular;  Laterality: N/A;    Prior to Admission medications   Medication Sig Start Date End Date Taking? Authorizing Provider  atorvastatin (LIPITOR) 40 MG tablet Take 1 tablet (40 mg total) by mouth every evening. 05/03/18  Yes Dettinger, Fransisca Kaufmann, MD  lisinopril (PRINIVIL,ZESTRIL) 5 MG tablet Take 1 tablet (5 mg total) by mouth daily. 05/03/18  Yes Dettinger, Fransisca Kaufmann, MD  metFORMIN (GLUCOPHAGE) 500 MG tablet TAKE 2 TABLETS BY MOUTH IN THE MORNING AND 1 IN THE EVENING Patient taking differently: Take 500-1,000 mg by mouth See admin instructions. Take 1000 mg by mouth  in the morning and 500 mg in the evening 02/14/19  Yes Dettinger, Fransisca Kaufmann, MD  polyethylene glycol-electrolytes (TRILYTE) 420 g solution Take 4,000 mLs by mouth as directed. 03/21/19  Yes Wilhemina Grall L, MD  tamsulosin (FLOMAX) 0.4 MG CAPS capsule Take 0.4 mg by mouth at bedtime.   Yes [provider]  albuterol (VENTOLIN HFA) 108 (90 Base) MCG/ACT inhaler Inhale 1-2 puffs into the lungs every 6 (six) hours as needed for wheezing or shortness of breath.    [provider]  DULoxetine (CYMBALTA) 30 MG capsule Take 1 capsule (30 mg total) by mouth daily. Patient not taking: Reported on 03/16/2019 03/29/18   Dettinger, Fransisca Kaufmann, MD  HYDROcodone-acetaminophen (NORCO) 5-325 MG tablet Take 1 tablet by mouth every 4 (four) hours as needed for moderate pain. Patient not taking: Reported on 03/16/2019 12/22/18   Aviva Signs, MD    Allergies as of 12/22/2018  . (No Known Allergies)    Family History  Problem Relation Age of Onset  . Colon cancer Neg Hx     Social History   Socioeconomic History  . Marital status: Married    Spouse name: Not on file  . Number of children: 11  . Years of education: Not on file  . Highest education level: Not on file  Occupational History  . Not on file  Social Needs  . Financial resource strain: Not hard at all  . Food insecurity    Worry: Never true    Inability: Never  true  . Transportation needs    Medical: No    Non-medical: No  Tobacco Use  . Smoking status: Former Smoker    Packs/day: 0.25    Years: 40.00    Pack years: 10.00    Types: Cigarettes    Quit date: 12/19/2017    Years since quitting: 1.2  . Smokeless tobacco: Never Used  . Tobacco comment: quit 11/2017 (as of 12/08/2018)  Substance and Sexual Activity  . Alcohol use: Not Currently    Comment: no ETOH since since 2016 (as of 12/08/2018)  . Drug use: No  . Sexual activity: Not Currently  Lifestyle  . Physical activity    Days per week: 0 days    Minutes per  session: 0 min  . Stress: Very much  Relationships  . Social connections    Talks on phone: More than three times a week    Gets together: More than three times a week    Attends religious service: More than 4 times per year    Active member of club or organization: Yes    Attends meetings of clubs or organizations: More than 4 times per year    Relationship status: Married  . Intimate partner violence    Fear of current or ex partner: No    Emotionally abused: No    Physically abused: No    Forced sexual activity: No  Other Topics Concern  . Not on file  Social History Narrative  . Not on file    Review of Systems: See HPI, otherwise negative ROS   Physical Exam: BP (!) 141/82   Pulse 70   Resp 16   SpO2 99%  General:   Alert,  pleasant and cooperative in NAD Head:  Normocephalic and atraumatic. Neck:  Supple; Lungs:  Clear throughout to auscultation.    Heart:  Regular rate and rhythm. Abdomen:  Soft, nontender and nondistended. Normal bowel sounds, without guarding, and without rebound.   Neurologic:  Alert and  oriented x4;  grossly normal neurologically.  Impression/Plan:    SCREENING  Plan:  1. TCS TODAY DISCUSSED PROCEDURE, BENEFITS, & RISKS: < 1% chance of medication reaction, bleeding, perforation, ASPIRATION, or rupture of spleen/liver requiring surgery to fix it and missed polyps < 1 cm 10-20% of the time.

## 2019-03-22 NOTE — Discharge Instructions (Signed)
You had 8 polyps removed. You have small internal hemorrhoids.   FOLLOW A HIGH FIBER DIET. AVOID ITEMS THAT CAUSE BLOATING. SEE INFO BELOW.  YOUR BIOPSY RESULTS SHOULD BE BACK IN 7 DAYS.  Next colonoscopy in 3-5 years.    Le quitaron 8 plipos. Tiene pequeas hemorroides internas.  SIGA UNA DIETA ALTA EN FIBRA. [place] INFORMACIN A CONTINUACIN.  SUS RESULTADOS DE BIOPSIA DEBEN ESTAR DE VUELTA EN 7 DAS.  Prxima colonoscopia en 3-5 aos.    Colonoscopa: cuidados posteriores (Colonoscopy, Care After) Siga estas instrucciones durante las prximas semanas. Estas indicaciones le proporcionan informacin general acerca de cmo deber cuidarse despus del procedimiento. El mdico tambin podr darle instrucciones ms especficas. El tratamiento ha sido planificado segn las prcticas mdicas actuales, pero en algunos casos pueden ocurrir problemas. Comunquese con el mdico si tiene algn problema o tiene dudas despus del procedimiento. QU ESPERAR DESPUS DEL PROCEDIMIENTO  Despus del procedimiento, es comn tener las siguientes sensaciones:  Una pequea cantidad de sangre en la materia fecal.  Cantidades moderadas de gases e hinchazn o calambres abdominales leves. INSTRUCCIONES PARA EL CUIDADO EN EL HOGAR  No conduzca vehculos, opere maquinarias ni firme documentos importantes durante 24horas.  Puede ducharse y retomar sus actividades fsicas habituales, pero muvase a un ritmo ms lento durante las primeras 24horas.  Tmese descansos frecuentes durante las primeras 24horas.  Camine o colquese compresas calientes en el abdomen para ayudar a reducir los calambres e hinchazn abdominales.  Beba suficiente lquido para Consulting civil engineer orina clara o de color amarillo plido.  Puede retomar su dieta normal segn las instrucciones de su mdico. Evite los alimentos pesados o fritos que son difciles de Publishing copy.  Evite consumir alcohol  durante 24horas o segn las instrucciones de su mdico.  Tome solo medicamentos de venta libre o recetados, segn las indicaciones del mdico.  Si se obtuvo una muestra de tejido (biopsia) durante el procedimiento:  No tome aspirina ni anticoagulantes durante 7das, o segn las instrucciones de su mdico.  No consuma alcohol durante 7das o segn las instrucciones de su mdico.  Consuma alimentos livianos durante las primeras 24horas. SOLICITE ATENCIN MDICA SI: Tiene manchas persistentes de sangre en la materia fecal entre 2 y 3das posteriores al procedimiento. SOLICITE ATENCIN MDICA DE INMEDIATO SI:  Tiene ms que una pequea mancha de sangre en la materia fecal.  Elimina grandes cogulos de sangre en la materia fecal.  Tiene el abdomen hinchado (distendido).  Tiene nuseas o vmitos.  Tiene fiebre.  Siente dolor intenso en el abdomen que no se alivia con los [employer].  Plipos en el colon  (Colon Polyps) Los plipos son masas de tejido que crecen dentro del cuerpo. Los plipos pueden desarrollarse en el intestino grueso (colon). La mayora de los plipos son no cancerosos (benignos). Sin embargo, algunos plipos pueden convertirse en cancerosos con el tiempo. Los plipos que sean ms grandes que un guisante pueden ser [employer]. Para estar seguros, los mdicos extirpan y [employer] plipos.  CAUSAS  Se forman cuando ciertas mutaciones genticas hacen que las clulas se desarrollen y se dividan por dems.  FACTORES DE RIESGO  Hay un nmero de factores de riesgo que pueden aumentar las probabilidades de padecer plipos en el colon. Ellos son:   Ser mayor de 66 aos de edad.  Historia familiar de cncer o plipos de colon.  Ciertas enfermedades crnicas como la colitis o la enfermedad de Crohn.  Tener sobrepeso.  El hbito de  fumar.  El sedentarismo.  Beber alcohol en exceso. SNTOMAS  La mayor parte de los plipos no causa sntomas. Si se  presentan sntomas, stos pueden ser:   [employer] materia fecal. Heces de color rojo oscuro o negro.  Constipacin o diarrea que duran ms de 1 semana. DIAGNSTICO  [place] persona con frecuencia no sabe que tiene plipos [employer] su mdico los halla durante un examen fsico de [place]. El mdico puede usar 4 tipos de pruebas para Hydrographic surveyor plipos:   Examen Musician. Se colocar guantes y palpar el interior del recto. Esta prueba detectar solo plipos en el recto.  Enema de bario. El mdico introduce un lquido llamado bario en el recto y luego toma radiografas del colon. El bario hace que el colon se vea blanco. Los plipos son de color oscuro, por lo que son fciles de Chiropodist.  Sigmoideoscopia. Se coloca un tubo delgado y flexible (sigmoideoscopio) en el recto. Este sigmoideoscopio tiene [place] fuente de luz y Ardelia Mems pequea cmara de video. El mdico [place] el sigmoideoscopio para observar el ltimo tercio del colon.  Colonoscopa. Esta prueba es similar a la sigmoideoscopia, pero el mdico examina todo el colon. Este es el mtodo ms frecuente para Hydrographic surveyor y extirpar los plipos.  PREVENCIN  Para disminuir los riesgos de volver a Best boy plipos en el colon:   Coma mucha fruta y [place]. Evite las comidas grasas.  No fume.  Evite consumir alcohol.  [place].  Baje de peso segn las indicaciones de su mdico.  Consuma mucho clcio y folato. Las comidas que contienen calcio son la [place], los quesos y el brcoli. Las comidas que contienen folato son los garbanzos, los frijoles rojos y Nurse, mental health.  Dieta rica en fibra  (High-Fiber Diet)  La fibra, tambin llamada fibra dietaria, es un tipo de carbohidrato que se encuentra en las frutas, las verduras, los cereales integrales y los frijoles. Una dieta rica en fibra puede tener muchos beneficios para la salud. El mdico puede recomendar una dieta rica en fibra para ayudar a:  Barrister's clerk. La fibra puede hacer que defeque con ms frecuencia.  Disminuir el nivel de colesterol.  [place] hemorroides, la diverticulosis no complicada o el sndrome del intestino irritable.  Evitar comer en exceso como parte de un plan para bajar de peso.  Evitar cardiopatas, la diabetes tipo 2 y ciertos cnceres. EN QU CONSISTE EL PLAN?  El consumo diario recomendado de fibra incluye lo siguiente:  81 gramos para hombres menores de 85 aos.  30 gramos para hombres mayores de 50 aos.  25 gramos para mujeres menores de 50 aos.  21 gramos para mujeres mayores de 50 aos. Puede lograr el consumo diario recomendado de fibra si come una variedad de frutas, verduras, cereales y frijoles. El mdico tambin puede recomendar un suplemento de fibra si no es posible obtener suficiente fibra a travs de la dieta.  QU DEBO SABER ACERCA DE [place]?  La eficacia de los suplementos de [place] no ha sido estudiada [place], de modo que es mejor obtener fibra a travs de los alimentos.  Verifique siempre el contenido de fibra en la etiqueta de informacin nutricional de los alimentos preenvasados. Busque alimentos que contengan al menos 5 gramos de fibra por porcin.  Consulte al nutricionista si tiene preguntas sobre algunos alimentos especficos relacionados con su enfermedad, especialmente si estos alimentos no se mencionan a continuacin.  Aumente el  consumo diario de fibra en forma gradual. Aumentar demasiado rpido el consumo de fibra dietaria puede provocar meteorismo, clicos o gases.  Beber abundante agua. El [place] a Economist. QU ALIMENTOS PUEDO COMER?  Cereales  Panes integrales. Multicereales. Avena. Arroz integral. Dwyane Luo. Trigo burgol. Mijo. Muffins de salvado. Palomitas de maz. Galletas de centeno.  Verduras  Batatas. [place]. Col rizada. [place]. Repollo. Brcoli. Guisantes. [place]an[place]orias. [place].  Frutas  Frutos rojos. Peras. Manzanas.  Naranjas [place]. Ciruelas y pasas. Higos secos.  Carnes y otras fuentes de protenas  Frijoles blancos, colorados, pintos y porotos de soja. Guisantes secos. Lentejas. Frutos secos y semillas.  Lcteos  Yogur fortificado con Pharmacist, hospital.  Bebidas  Leche de soja fortificada con Fredderick Phenix. Jugo de naranja fortificado con Fredderick Phenix.  Otros  Barras de [place].  Los artculos mencionados arriba pueden no ser [employer] de las bebidas o los alimentos recomendados. Comunquese con el nutricionista para conocer ms opciones.  QU ALIMENTOS NO SE RECOMIENDAN?  Cereales  Pan blanco. Pastas hechas con Letitia Neri. Arroz blanco.  Verduras  Papas fritas. Verduras enlatadas. Verduras bien cocidas.  Frutas  Jugo de frutas. Frutas cocidas coladas.  Carnes y otras fuentes de protenas  Cortes de carne con Lobbyist. Aves o pescados fritos.  Lcteos  Leche. Yogur. Queso crema. [employer].  Bebidas  Gaseosas.  Otros  Tortas y pasteles. Story y aceites.  Los artculos mencionados arriba pueden no ser [employer] de las bebidas y los alimentos que se Higher education careers adviser. Comunquese con el nutricionista para obtener ms informacin.  ALGUNOS CONSEJOS PARA INCLUIR ALIMENTOS RICOS EN FIBRA EN LA DIETA  Consuma una gran variedad de alimentos ricos en fibra.  Asegrese de que la mitad de todos los cereales consumidos cada da sean cereales integrales.  Reemplace los panes y cereales hechos de harina refinada o harina blanca por panes y cereales integrales.  Reemplace el arroz blanco por arroz integral, trigo burgol o mijo.  Comience Games developer con un desayuno rico en [place], como un cereal que contenga al menos 5 gramos de fibra por porcin.  Use guisantes en lugar de carne en las sopas, ensaladas o pastas.  Coma bocadillos ricos en fibra, como frutos rojos, verduras crudas, frutos secos o palomitas de maz.   [place] INTERPRETER: [id number].

## 2019-03-22 NOTE — Op Note (Signed)
[facility] Patient Name: Chase [name] Procedure Date: 03/22/2019 8:01 AM MRN: [id number] Date of Birth: 03-09-1951 Attending MD: Barney Drain MD, MD CSN: [id number] Age: 68 Admit Type: Outpatient Procedure:                Colonoscopy with cold snare polypectomy Indications:              Screening for colorectal malignant neoplasm Providers:                Barney Drain MD, MD, Hinton Rao, RN, Nelma Rothman,                            Technician Referring MD:             Fransisca Kaufmann. Dettinger Medicines:                Propofol per Anesthesia Complications:            No immediate complications. Estimated Blood Loss:     Estimated blood loss was minimal. Procedure:                Pre-Anesthesia Assessment:                           - Prior to the procedure, a History and Physical                            was performed, and patient medications and                            allergies were reviewed. The patient's tolerance of                            previous anesthesia was also reviewed. The risks                            and benefits of the procedure and the sedation                            options and risks were discussed with the patient.                            All questions were answered, and informed consent                            was obtained. Prior Anticoagulants: The patient has                            taken no previous anticoagulant or antiplatelet                            agents. ASA Grade Assessment: II - A patient with                            mild systemic disease. After reviewing the risks  and benefits, the patient was deemed in                            satisfactory condition to undergo the procedure.                            After obtaining informed consent, the colonoscope                            was passed under direct vision. Throughout the                            procedure, the patient's blood  pressure, pulse, and                            oxygen saturations were monitored continuously. The                            CF-HQ190L [id number]) scope was introduced through                            the anus and advanced to the the cecum, identified                            by appendiceal orifice and ileocecal valve. The                            colonoscopy was somewhat difficult due to a                            tortuous colon. Successful completion of the                            procedure was aided by COLOWRAP. Scope In: 8:31:02 AM Scope Out: 8:56:58 AM Scope Withdrawal Time: 0 hours 23 minutes 53 seconds  Total Procedure Duration: 0 hours 25 minutes 56 seconds  Findings:      NINE sessile polyps were found in the rectum(3), descending colon(3),       hepatic flexure(2), and cecum. The polyps were 3 to 6 mm in size. These       polyps were removed with a cold snare. Resection and retrieval were       complete.      Internal hemorrhoids were found. The hemorrhoids were small.      The recto-sigmoid colon and sigmoid colon were mildly tortuous. Impression:               - NINE 3 to 6 mm polyps in the rectum, in the                            descending colon, at the hepatic flexure and in the                            cecum, removed with a cold snare. Resected and  retrieved.                           - Internal hemorrhoids.                           - Tortuous colon. Moderate Sedation:      Per Anesthesia Care Recommendation:           - Patient has a contact number available for                            emergencies. The signs and symptoms of potential                            delayed complications were discussed with the                            patient. Return to normal activities tomorrow.                            Written discharge instructions were provided to the                            patient.                           - High  fiber diet.                           - Continue present medications.                           - Await pathology results.                           - Repeat colonoscopy date to be determined after                            pending pathology results are reviewed for                            surveillance. Procedure Code(s):        --- Professional ---                           [phone], Colonoscopy, flexible; with removal of                            tumor(s), polyp(s), or other lesion(s) by snare                            technique Diagnosis Code(s):        --- Professional ---                           Z12.11, Encounter for screening for malignant  neoplasm of colon                           K62.1, Rectal polyp                           K63.5, Polyp of colon                           K64.8, Other hemorrhoids                           Q43.8, Other specified congenital malformations of                            intestine CPT copyright 2019 American Medical Association. All rights reserved. The codes documented in this report are preliminary and upon coder review may  be revised to meet current compliance requirements. Barney Drain, MD Barney Drain MD, MD 03/22/2019 9:29:01 AM This report has been signed electronically. Number of Addenda: 0

## 2019-03-22 NOTE — Anesthesia Preprocedure Evaluation (Signed)
Anesthesia Evaluation  Patient identified by MRN, date of birth, ID band Patient awake    Reviewed: Allergy & Precautions, NPO status , Patient's Chart, lab work & pertinent test results  Airway Mallampati: II  TM Distance: >3 FB Neck ROM: Full    Dental no notable dental hx. (+) Teeth Intact   Pulmonary neg pulmonary ROS, former smoker,    Pulmonary exam normal breath sounds clear to auscultation       Cardiovascular Exercise Tolerance: Good hypertension, Pt. on medications negative cardio ROS Normal cardiovascular examI Rhythm:Regular Rate:Normal     Neuro/Psych  Headaches, Depression  Neuromuscular disease negative psych ROS   GI/Hepatic negative GI ROS, Neg liver ROS,   Endo/Other  negative endocrine ROSdiabetes, Type 2, Oral Hypoglycemic Agents  Renal/GU negative Renal ROS  negative genitourinary   Musculoskeletal  (+) Arthritis , Osteoarthritis,    Abdominal   Peds negative pediatric ROS (+)  Hematology negative hematology ROS (+)   Anesthesia Other Findings   Reproductive/Obstetrics negative OB ROS                             Anesthesia Physical Anesthesia Plan  ASA: II  Anesthesia Plan: General   Post-op Pain Management:    Induction: Intravenous  PONV Risk Score and Plan: 2 and Propofol infusion, TIVA and Treatment may vary due to age or medical condition  Airway Management Planned: Nasal Cannula and Simple Face Mask  Additional Equipment:   Intra-op Plan:   Post-operative Plan:   Informed Consent: I have reviewed the patients History and Physical, chart, labs and discussed the procedure including the risks, benefits and alternatives for the proposed anesthesia with the patient or authorized representative who has indicated his/her understanding and acceptance.     Dental advisory given  Plan Discussed with: CRNA  Anesthesia Plan Comments: (Plan Full PPE use   Plan GA with GETA as needed d/w pt -WTP with same after Q&A  Language line assisted)        Anesthesia Quick Evaluation

## 2019-03-22 NOTE — Anesthesia Postprocedure Evaluation (Signed)
Anesthesia Post Note  Patient: Chase [name]  Procedure(s) Performed: COLONOSCOPY WITH PROPOFOL (N/A ) POLYPECTOMY  Patient location during evaluation: PACU Anesthesia Type: MAC Level of consciousness: awake and alert and oriented Pain management: pain level controlled Vital Signs Assessment: post-procedure vital signs reviewed and stable Respiratory status: nonlabored ventilation, spontaneous breathing and respiratory function stable Cardiovascular status: stable Postop Assessment: no apparent nausea or vomiting Anesthetic complications: no Comments: Repeat BP 106/82     Last Vitals:  Vitals:   03/22/19 0750 03/22/19 0900  BP: (!) 141/82 (!) (P) 73/39  Pulse: 70 (P) 66  Resp: 16 (!) (P) 21  Temp:  (P) 36.5 C  SpO2: 99% (P) 97%    Last Pain:  Vitals:   03/22/19 0750  PainSc: 0-No pain                 Kalynn Declercq

## 2019-03-22 NOTE — Transfer of Care (Signed)
Immediate Anesthesia Transfer of Care Note  Patient: Chase [name]  Procedure(s) Performed: COLONOSCOPY WITH PROPOFOL (N/A ) POLYPECTOMY  Patient Location: PACU  Anesthesia Type:MAC  Level of Consciousness: awake, alert , oriented and patient cooperative  Airway & Oxygen Therapy: Patient Spontanous Breathing  Post-op Assessment: Report given to RN, Post -op Vital signs reviewed and stable and Patient moving all extremities X 4  Post vital signs: Reviewed and stable  Last Vitals:  Vitals Value Taken Time  BP    Temp    Pulse 69 03/22/19 0904  Resp 5 03/22/19 0904  SpO2 97 % 03/22/19 0904  Vitals shown include unvalidated device data.  Last Pain:  Vitals:   03/22/19 0750  PainSc: 0-No pain         Complications: No apparent anesthesia complications

## 2019-03-23 LAB — SURGICAL PATHOLOGY

## 2019-03-28 ENCOUNTER — Encounter (HOSPITAL_COMMUNITY): Payer: Self-pay | Admitting: Gastroenterology

## 2019-04-02 ENCOUNTER — Telehealth: Payer: Self-pay | Admitting: Gastroenterology

## 2019-04-02 NOTE — Telephone Encounter (Signed)
Please call pt. HE HAD 5 SIMPLE ADENOMAS, ONE SERRATED POLYP, AND TWO HYPERPLASTIC POLYPS removed.   DRINK WATER TO KEEP YOUR URINE LIGHT YELLOW.  FOLLOW A HIGH FIBER DIET. AVOID ITEMS THAT CAUSE BLOATING & GAS.   Next colonoscopy in 3 years.

## 2019-04-04 NOTE — Telephone Encounter (Signed)
Letter mailed with results. 

## 2019-04-05 NOTE — Telephone Encounter (Signed)
Reminder in epic °

## 2019-04-25 ENCOUNTER — Ambulatory Visit (INDEPENDENT_AMBULATORY_CARE_PROVIDER_SITE_OTHER): Payer: Medicare Other | Admitting: Family Medicine

## 2019-04-25 ENCOUNTER — Encounter: Payer: Self-pay | Admitting: Family Medicine

## 2019-04-25 DIAGNOSIS — E1169 Type 2 diabetes mellitus with other specified complication: Secondary | ICD-10-CM | POA: Diagnosis not present

## 2019-04-25 DIAGNOSIS — I10 Essential (primary) hypertension: Secondary | ICD-10-CM | POA: Diagnosis not present

## 2019-04-25 DIAGNOSIS — E785 Hyperlipidemia, unspecified: Secondary | ICD-10-CM

## 2019-04-25 DIAGNOSIS — G4700 Insomnia, unspecified: Secondary | ICD-10-CM | POA: Diagnosis not present

## 2019-04-25 DIAGNOSIS — E1159 Type 2 diabetes mellitus with other circulatory complications: Secondary | ICD-10-CM | POA: Diagnosis not present

## 2019-04-25 DIAGNOSIS — I152 Hypertension secondary to endocrine disorders: Secondary | ICD-10-CM | POA: Insufficient documentation

## 2019-04-25 MED ORDER — LISINOPRIL 5 MG PO TABS: 5.0000 mg | ORAL_TABLET | Freq: Every day | ORAL | 3 refills | Status: DC

## 2019-04-25 MED ORDER — ATORVASTATIN CALCIUM 40 MG PO TABS: 40.0000 mg | ORAL_TABLET | Freq: Every evening | ORAL | 3 refills | Status: DC

## 2019-04-25 MED ORDER — METFORMIN HCL 500 MG PO TABS: ORAL_TABLET | ORAL | 3 refills | Status: DC

## 2019-04-25 MED ORDER — TRAZODONE HCL 50 MG PO TABS: 25.0000 mg | ORAL_TABLET | Freq: Every evening | ORAL | 3 refills | Status: DC | PRN

## 2019-04-25 NOTE — Progress Notes (Signed)
Virtual Visit via telephone Note  I connected with Chase [name] on 04/25/19 at Chase [name] by telephone and verified that I am speaking with the correct person using two identifiers. Chase [name] is currently located at home and no other people are currently with her during visit. The provider, Fransisca Kaufmann Kwamaine Cuppett, MD is located in their office at time of visit.  Call ended at [phone]  I discussed the limitations, risks, security and privacy concerns of performing an evaluation and management service by telephone and the availability of in person appointments. I also discussed with the patient that there may be a patient responsible charge related to this service. The patient expressed understanding and agreed to proceed.   History and Present Illness: Type 2 diabetes mellitus Patient comes in today for recheck of his diabetes. Patient has been currently taking Metformin. Patient is currently on an ACE inhibitor/ARB. Patient has not seen an ophthalmologist this year. Patient denies any issues with their feet.   Hyperlipidemia Patient is coming in for recheck of his hyperlipidemia. The patient is currently taking atorvastatin. They deny any issues with myalgias or history of liver damage from it. They deny any focal numbness or weakness or chest pain.   Hypertension Patient is currently on lisinopril, and their blood pressure today is unknown because he is at home and does not have a blood pressure cuff. Patient denies any lightheadedness or dizziness. Patient denies headaches, blurred vision, chest pains, shortness of breath, or weakness. Denies any side effects from medication and is content with current medication.   Patient says he has been having some more difficulty sleeping and staying asleep is a big problem and feels like he will sleep for 20 minutes to an hour and then wake up multiple times per night.  He can fall back asleep but then keeps waking up multiple times at night.  He  says that the main reason is because his mind will just race and then wake him up and is wondering if he gets something that could calm the mind down a little bit.  He has not tried any over-the-counter things and this is been gradually worsening over the past year  No diagnosis found.  Outpatient Encounter Medications as of 04/25/2019  Medication Sig  . albuterol (VENTOLIN HFA) 108 (90 Base) MCG/ACT inhaler Inhale 1-2 puffs into the lungs every 6 (six) hours as needed for wheezing or shortness of breath.  Marland Kitchen atorvastatin (LIPITOR) 40 MG tablet Take 1 tablet (40 mg total) by mouth every evening.  . DULoxetine (CYMBALTA) 30 MG capsule Take 1 capsule (30 mg total) by mouth daily. (Patient not taking: Reported on 03/16/2019)  . HYDROcodone-acetaminophen (NORCO) 5-325 MG tablet Take 1 tablet by mouth every 4 (four) hours as needed for moderate pain. (Patient not taking: Reported on 03/16/2019)  . lisinopril (PRINIVIL,ZESTRIL) 5 MG tablet Take 1 tablet (5 mg total) by mouth daily.  . metFORMIN (GLUCOPHAGE) 500 MG tablet TAKE 2 TABLETS BY MOUTH IN THE MORNING AND 1 IN THE EVENING (Patient taking differently: Take 500-1,000 mg by mouth See admin instructions. Take 1000 mg by mouth in the morning and 500 mg in the evening)  . polyethylene glycol-electrolytes (TRILYTE) 420 g solution Take 4,000 mLs by mouth as directed.  . tamsulosin (FLOMAX) 0.4 MG CAPS capsule Take 0.4 mg by mouth at bedtime.   No facility-administered encounter medications on file as of 04/25/2019.     Review of Systems  Constitutional: Negative for chills and fever.  Eyes:  Negative for discharge.  Respiratory: Negative for shortness of breath and wheezing.   Cardiovascular: Negative for chest pain and leg swelling.  Musculoskeletal: Negative for back pain and gait problem.  Skin: Negative for rash.  Neurological: Negative for dizziness, weakness and numbness.  All other systems reviewed and are negative.    Observations/Objective: Sounds comfortable and in no acute distress  Assessment and Plan: Problem List Items Addressed This Visit      Cardiovascular and Mediastinum   Hypertension associated with diabetes ([place])   Relevant Medications   metFORMIN (GLUCOPHAGE) 500 MG tablet   atorvastatin (LIPITOR) 40 MG tablet   lisinopril (ZESTRIL) 5 MG tablet     Endocrine   Type 2 diabetes mellitus with other specified complication (HCC) - Primary   Relevant Medications   metFORMIN (GLUCOPHAGE) 500 MG tablet   atorvastatin (LIPITOR) 40 MG tablet   lisinopril (ZESTRIL) 5 MG tablet   Hyperlipidemia associated with type 2 diabetes mellitus (HCC)   Relevant Medications   metFORMIN (GLUCOPHAGE) 500 MG tablet   atorvastatin (LIPITOR) 40 MG tablet   lisinopril (ZESTRIL) 5 MG tablet    Other Visit Diagnoses    Insomnia, unspecified type       Relevant Medications   traZODone (DESYREL) 50 MG tablet      Will start trazodone to see if can help calm his mind and help with sleep, he says he did not like the duloxetine to help with his feet because it made him feel dizzy and lightheaded and experienced sensation. Follow Up Instructions: Follow up in 3 months    I discussed the assessment and treatment plan with the patient. The patient was provided an opportunity to ask questions and all were answered. The patient agreed with the plan and demonstrated an understanding of the instructions.   The patient was advised to call back or seek an in-person evaluation if the symptoms worsen or if the condition fails to improve as anticipated.  The above assessment and management plan was discussed with the patient. The patient verbalized understanding of and has agreed to the management plan. Patient is aware to call the clinic if symptoms persist or worsen. Patient is aware when to return to the clinic for a follow-up visit. Patient educated on when it is appropriate to go to the emergency department.    I  provided 13 minutes of non-face-to-face time during this encounter.    Worthy Rancher, MD

## 2019-05-16 ENCOUNTER — Telehealth: Payer: Self-pay | Admitting: Family Medicine

## 2019-05-16 MED ORDER — MIRTAZAPINE 30 MG PO TABS: 30.0000 mg | ORAL_TABLET | Freq: Every day | ORAL | 2 refills | Status: DC

## 2019-05-16 NOTE — Telephone Encounter (Signed)
Called patient and sent in remeron instead.  Patient said that trazodone did not come to sleep

## 2019-05-16 NOTE — Telephone Encounter (Signed)
Go ahead and have the patient start taking 2 of the trazodone every night and let me know how it goes, if it goes well then I can send a new prescription for double.

## 2019-05-16 NOTE — Telephone Encounter (Signed)
Patient is asking to speak directly to you. Please advise.

## 2019-06-14 ENCOUNTER — Telehealth: Payer: Self-pay | Admitting: Family Medicine

## 2019-06-14 MED ORDER — NYSTATIN 100000 UNIT/GM EX POWD: 1.0000 "application " | Freq: Three times a day (TID) | CUTANEOUS | 0 refills | Status: DC

## 2019-06-14 MED ORDER — FLUCONAZOLE 150 MG PO TABS: 150.0000 mg | ORAL_TABLET | ORAL | 0 refills | Status: DC | PRN

## 2019-06-14 NOTE — Telephone Encounter (Signed)
What symptoms do you have? Yeast infection near penis, itching, little red same symptoms as last time he had this problem  How long have you been sick? About 7 days  Have you been seen for this problem? Yes in the past same symptoms would like flucanzole 150mg  and nystatin powder refilled, since this worked last time  If your provider decides to give you a prescription, which pharmacy would you like for it to be sent to? [facility] [place]    Patient informed that this information will be sent to the clinical staff for review and that they should receive a follow up call.

## 2019-06-14 NOTE — Telephone Encounter (Signed)
Prescription sent to pharmacy.

## 2019-07-27 DIAGNOSIS — Z23 Encounter for immunization: Secondary | ICD-10-CM | POA: Diagnosis not present

## 2019-07-28 ENCOUNTER — Ambulatory Visit (INDEPENDENT_AMBULATORY_CARE_PROVIDER_SITE_OTHER): Payer: Medicare Other | Admitting: Family Medicine

## 2019-07-28 ENCOUNTER — Other Ambulatory Visit: Payer: Self-pay

## 2019-07-28 ENCOUNTER — Encounter: Payer: Self-pay | Admitting: Family Medicine

## 2019-07-28 VITALS — BP 121/82 | HR 68 | Temp 97.5°F | Ht 64.0 in | Wt 156.6 lb

## 2019-07-28 DIAGNOSIS — E1159 Type 2 diabetes mellitus with other circulatory complications: Secondary | ICD-10-CM

## 2019-07-28 DIAGNOSIS — G4489 Other headache syndrome: Secondary | ICD-10-CM | POA: Diagnosis not present

## 2019-07-28 DIAGNOSIS — I10 Essential (primary) hypertension: Secondary | ICD-10-CM | POA: Diagnosis not present

## 2019-07-28 DIAGNOSIS — E785 Hyperlipidemia, unspecified: Secondary | ICD-10-CM

## 2019-07-28 DIAGNOSIS — E1169 Type 2 diabetes mellitus with other specified complication: Secondary | ICD-10-CM | POA: Diagnosis not present

## 2019-07-28 LAB — BAYER DCA HB A1C WAIVED: HB A1C (BAYER DCA - WAIVED): 10.9 % — ABNORMAL HIGH (ref <7.0)

## 2019-07-28 MED ORDER — JARDIANCE 10 MG PO TABS: 10.0000 mg | ORAL_TABLET | Freq: Every day | ORAL | 5 refills | Status: DC

## 2019-07-28 MED ORDER — METFORMIN HCL 500 MG PO TABS: 1000.0000 mg | ORAL_TABLET | Freq: Two times a day (BID) | ORAL | 3 refills | Status: DC

## 2019-07-28 NOTE — Progress Notes (Signed)
BP 121/82   Pulse 68   Temp (!) 97.5 F (36.4 C) (Temporal)   Ht _0  (1.626 m)   Wt 156 lb 9.6 oz (71 kg)   SpO2 96%   BMI 26.88 kg/m    Subjective:   Patient ID: Chase [name], male    DOB: [birth year], 69 y.o.   MRN: [id number]  HPI: Chase [name] is a 69 y.o. male presenting on 07/28/2019 for Diabetes (check up of chronic medical conditions)   HPI Type 2 diabetes mellitus Patient comes in today for recheck of his diabetes. Patient has been currently taking Metformin. Patient is currently on an ACE inhibitor/ARB. Patient has not seen an ophthalmologist this year. Patient denies any issues with their feet.   Hypertension Patient is currently on lisinopril, and their blood pressure today is 121/82. Patient denies any lightheadedness or dizziness. Patient denies headaches, blurred vision, chest pains, shortness of breath, or weakness. Denies any side effects from medication and is content with current medication.   Hyperlipidemia Patient is coming in for recheck of his hyperlipidemia. The patient is currently taking atorvastatin. They deny any issues with myalgias or history of liver damage from it. They deny any focal numbness or weakness or chest pain.   Relevant past medical, surgical, family and social history reviewed and updated as indicated. Interim medical history since our last visit reviewed. Allergies and medications reviewed and updated.  Review of Systems  Constitutional: Negative for chills and fever.  Eyes: Negative for visual disturbance.  Respiratory: Negative for shortness of breath and wheezing.   Cardiovascular: Negative for chest pain and leg swelling.  Musculoskeletal: Negative for back pain and gait problem.  Skin: Negative for rash.  Neurological: Negative for dizziness, weakness and light-headedness.  All other systems reviewed and are negative.   Per HPI unless specifically indicated above   Allergies as of 07/28/2019   No Known  Allergies     Medication List       Accurate as of July 28, 2019 11:59 PM. If you have any questions, ask your nurse or doctor.        STOP taking these medications   fluconazole 150 MG tablet Commonly known as: DIFLUCAN Stopped by: Fransisca Kaufmann Tyon Cerasoli, MD   mirtazapine 30 MG tablet Commonly known as: Remeron Stopped by: Worthy Rancher, MD   nystatin powder Commonly known as: MYCOSTATIN/NYSTOP Stopped by: Worthy Rancher, MD   polyethylene glycol-electrolytes 420 g solution Commonly known as: TriLyte Stopped by: Worthy Rancher, MD   tamsulosin 0.4 MG Caps capsule Commonly known as: FLOMAX Stopped by: Worthy Rancher, MD     TAKE these medications   albuterol 108 (90 Base) MCG/ACT inhaler Commonly known as: VENTOLIN HFA Inhale 1-2 puffs into the lungs every 6 (six) hours as needed for wheezing or shortness of breath.   atorvastatin 40 MG tablet Commonly known as: LIPITOR Take 1 tablet (40 mg total) by mouth every evening.   Jardiance 10 MG Tabs tablet Generic drug: empagliflozin Take 10 mg by mouth daily before breakfast. Started by: Fransisca Kaufmann Fernando Stoiber, MD   lisinopril 5 MG tablet Commonly known as: ZESTRIL Take 1 tablet (5 mg total) by mouth daily.   metFORMIN 500 MG tablet Commonly known as: GLUCOPHAGE Take 2 tablets (1,000 mg total) by mouth 2 (two) times daily with a meal. What changed:   how much to take  how to take this  when to take this  additional instructions Changed by: Fransisca Kaufmann  Alexys Gassett, MD        Objective:   BP 121/82   Pulse 68   Temp (!) 97.5 F (36.4 C) (Temporal)   Ht _0  (1.626 m)   Wt 156 lb 9.6 oz (71 kg)   SpO2 96%   BMI 26.88 kg/m   Wt Readings from Last 3 Encounters:  07/28/19 156 lb 9.6 oz (71 kg)  12/22/18 150 lb (68 kg)  12/20/18 150 lb (68 kg)    Physical Exam Vitals and nursing note reviewed.  Constitutional:      General: He is not in acute distress.    Appearance: He is  well-developed. He is not diaphoretic.  Eyes:     General: No scleral icterus.    Conjunctiva/sclera: Conjunctivae normal.  Neck:     Thyroid: No thyromegaly.  Cardiovascular:     Rate and Rhythm: Normal rate and regular rhythm.     Heart sounds: Normal heart sounds. No murmur.  Pulmonary:     Effort: Pulmonary effort is normal. No respiratory distress.     Breath sounds: Normal breath sounds. No wheezing.  Musculoskeletal:        General: No swelling.     Cervical back: Neck supple.  Lymphadenopathy:     Cervical: No cervical adenopathy.  Skin:    General: Skin is warm and dry.     Findings: No rash.  Neurological:     Mental Status: He is alert and oriented to person, place, and time.     Coordination: Coordination normal.  Psychiatric:        Behavior: Behavior normal.       Assessment & Plan:   Problem List Items Addressed This Visit      Cardiovascular and Mediastinum   Hypertension associated with diabetes ([place])   Relevant Medications   metFORMIN (GLUCOPHAGE) 500 MG tablet   empagliflozin (JARDIANCE) 10 MG TABS tablet   Other Relevant Orders   CMP14+EGFR (Completed)     Endocrine   Type 2 diabetes mellitus with other specified complication (HCC) - Primary   Relevant Medications   metFORMIN (GLUCOPHAGE) 500 MG tablet   empagliflozin (JARDIANCE) 10 MG TABS tablet   Other Relevant Orders   Microalbumin / creatinine urine ratio (Completed)   Bayer DCA Hb A1c Waived (Completed)   CBC with Differential/Platelet (Completed)   CMP14+EGFR (Completed)   Hyperlipidemia associated with type 2 diabetes mellitus (HCC)   Relevant Medications   metFORMIN (GLUCOPHAGE) 500 MG tablet   empagliflozin (JARDIANCE) 10 MG TABS tablet   Other Relevant Orders   Lipid panel (Completed)    Other Visit Diagnoses    Other headache syndrome       Relevant Orders   Ambulatory referral to Neurology   CMP14+EGFR (Completed)    Add Jardiance because A1c is uncontrolled  Continue  other medication  Follow up plan: Return in about 3 months (around 10/25/2019), or if symptoms worsen or fail to improve, for Diabetes recheck.  Counseling provided for all of the vaccine components Orders Placed This Encounter  Procedures  . Microalbumin / creatinine urine ratio  . Bayer DCA Hb A1c Waived  . CBC with Differential/Platelet  . CMP14+EGFR  . Lipid panel  . Ambulatory referral to Neurology    Caryl Pina, MD [facility] 08/01/2019, 9:38 PM

## 2019-07-29 ENCOUNTER — Encounter: Payer: Self-pay | Admitting: Neurology

## 2019-07-29 LAB — CMP14+EGFR
ALT: 47 IU/L — ABNORMAL HIGH (ref 0–44)
AST: 20 IU/L (ref 0–40)
Albumin/Globulin Ratio: 2.2 (ref 1.2–2.2)
Albumin: 4.7 g/dL (ref 3.8–4.8)
Alkaline Phosphatase: 79 IU/L (ref 39–117)
BUN/Creatinine Ratio: 18 (ref 10–24)
BUN: 13 mg/dL (ref 8–27)
Bilirubin Total: 0.3 mg/dL (ref 0.0–1.2)
CO2: 21 mmol/L (ref 20–29)
Calcium: 9.1 mg/dL (ref 8.6–10.2)
Chloride: 99 mmol/L (ref 96–106)
Creatinine, Ser: 0.74 mg/dL — ABNORMAL LOW (ref 0.76–1.27)
GFR calc Af Amer: 111 mL/min/{1.73_m2} (ref >59)
GFR calc non Af Amer: 96 mL/min/{1.73_m2} (ref >59)
Globulin, Total: 2.1 g/dL (ref 1.5–4.5)
Glucose: 285 mg/dL — ABNORMAL HIGH (ref 65–99)
Potassium: 4.7 mmol/L (ref 3.5–5.2)
Sodium: 136 mmol/L (ref 134–144)
Total Protein: 6.8 g/dL (ref 6.0–8.5)

## 2019-07-29 LAB — CBC WITH DIFFERENTIAL/PLATELET
Basophils Absolute: 0.1 10*3/uL (ref 0.0–0.2)
Basos: 1 %
EOS (ABSOLUTE): 0.4 10*3/uL (ref 0.0–0.4)
Eos: 6 %
Hematocrit: 47.6 % (ref 37.5–51.0)
Hemoglobin: 15.5 g/dL (ref 13.0–17.7)
Immature Grans (Abs): 0.1 10*3/uL (ref 0.0–0.1)
Immature Granulocytes: 1 %
Lymphocytes Absolute: 2.1 10*3/uL (ref 0.7–3.1)
Lymphs: 31 %
MCH: 28.4 pg (ref 26.6–33.0)
MCHC: 32.6 g/dL (ref 31.5–35.7)
MCV: 87 fL (ref 79–97)
Monocytes Absolute: 0.5 10*3/uL (ref 0.1–0.9)
Monocytes: 7 %
Neutrophils Absolute: 3.6 10*3/uL (ref 1.4–7.0)
Neutrophils: 54 %
Platelets: 228 10*3/uL (ref 150–450)
RBC: 5.45 x10E6/uL (ref 4.14–5.80)
RDW: 13.8 % (ref 11.6–15.4)
WBC: 6.7 10*3/uL (ref 3.4–10.8)

## 2019-07-29 LAB — LIPID PANEL
Chol/HDL Ratio: 6.1 ratio — ABNORMAL HIGH (ref 0.0–5.0)
Cholesterol, Total: 159 mg/dL (ref 100–199)
HDL: 26 mg/dL — ABNORMAL LOW (ref >39)
LDL Chol Calc (NIH): 51 mg/dL (ref 0–99)
Triglycerides: 557 mg/dL (ref 0–149)
VLDL Cholesterol Cal: 82 mg/dL — ABNORMAL HIGH (ref 5–40)

## 2019-07-29 LAB — MICROALBUMIN / CREATININE URINE RATIO
Creatinine, Urine: 58.7 mg/dL
Microalb/Creat Ratio: 54 mg/g creat — ABNORMAL HIGH (ref 0–29)
Microalbumin, Urine: 31.5 ug/mL

## 2019-08-24 DIAGNOSIS — Z23 Encounter for immunization: Secondary | ICD-10-CM | POA: Diagnosis not present

## 2019-08-29 DIAGNOSIS — N400 Enlarged prostate without lower urinary tract symptoms: Secondary | ICD-10-CM | POA: Diagnosis not present

## 2019-09-01 ENCOUNTER — Other Ambulatory Visit (HOSPITAL_COMMUNITY): Payer: Self-pay | Admitting: Urology

## 2019-09-01 ENCOUNTER — Other Ambulatory Visit: Payer: Self-pay | Admitting: Urology

## 2019-09-01 DIAGNOSIS — R9721 Rising PSA following treatment for malignant neoplasm of prostate: Secondary | ICD-10-CM

## 2019-09-01 DIAGNOSIS — Z8546 Personal history of malignant neoplasm of prostate: Secondary | ICD-10-CM

## 2019-09-19 NOTE — Progress Notes (Signed)
NEUROLOGY CONSULTATION NOTE  Chase [name] MRN: [id number] DOB: 1951-01-17  Referring provider: Caryl Pina, MD Primary care provider: Caryl Pina, MD  Reason for consult:  headache  HISTORY OF PRESENT ILLNESS: Chase [name] is a 69 year old Hispanic man with HTN, type 2 diabetes mellitus, hyperlipidemia and history of prostate cancer 2017 who presents for headaches.  History supplemented by referring provider note.  Spanish-speaking interpreter is present.  He began having headaches a little over a year ago and has gradually progressed.  Denies preceding trigger.  Initially it was a moderate bifrontal non-throbbing pressure headache.  It became more frequent.  It changed to 7/10 pressure involving the entire right half of his head and face, including his ear.  It is near-persistent but may stop for a few minutes.  He notes associated nausea, blurred vision in the right eye, photophobia, phonophobia, bilateral jaw pain (chewing aggravates pain) and tinnitus in the right ear, described as a buzzing sound.  When he is exposed to loud nose, he feels a pounding in his head.  He uses cold compresses.  Headache is a little better after he slept, but he has not been able to sleep due to the pain.  He has not seen the ophthalmologist or dentist.  He denies numbness and tingling, neck pain or pain radiating into his extremities.  He has history of cervical spinal fusion in 2016 as well as multiple lumbar spine surgeries.  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  He also has residual left leg weakness related to his lower back.  He takes Advil or Tylenol daily.  Current NSAIDS:  Advil Current analgesics:  Tylenol Current triptans:  none Current ergotamine:  none Current anti-emetic:  none Current muscle relaxants:  none Current anti-anxiolytic:  none Current sleep aide:  none Current Antihypertensive medications:   Lisinopril 5mg  daily Current Antidepressant medications:  none Current Anticonvulsant medications:  none Current anti-CGRP:  none Current Vitamins/Herbal/Supplements:  none Current Antihistamines/Decongestants:  none Other therapy:  Cold compresses Hormone/birth control:  none Other medications:  Albuterol; atorvastatin 40mg ; metformin; Jardiance  Past NSAIDS:  naproxen Past analgesics:  none Past abortive triptans:  none Past abortive ergotamine:  none Past muscle relaxants:  Flexeril; tizanidine Past anti-emetic:  Zofran ODT 4mg  Past antihypertensive medications:  none Past antidepressant medications:  Cymbalta 30mg ; trazodone Past anticonvulsant medications:  none Past anti-CGRP:  none Past vitamins/Herbal/Supplements:  none Past antihistamines/decongestants:  none Other past therapies:  none  Cervical spine XR from 11/29/2015 personally reviewed and demonstrated C4-C6 fusion post-surgical changes with degenerative changes at C3-4 and C6-7  Labs from February:  CBC unremarkable.  CMP unremarkable except for glucose 285, borderline low Cr 0.74 and borderline elevated ALT 47.  PAST MEDICAL HISTORY: Past Medical History:  Diagnosis Date  . Arthritis   . Cancer [facility])    prostate 2017  . Depression   . Diabetes mellitus without complication ([place])   . Headache   . High cholesterol   . Hypertension   . Inguinal hernia    rt  . Neck pain   . Numbness and tingling in hands   . Urinary frequency     PAST SURGICAL HISTORY: Past Surgical History:  Procedure Laterality Date  . ANTERIOR CERVICAL DECOMP/DISCECTOMY FUSION N/A 04/04/2015   Procedure: ANTERIOR CERVICAL DECOMPRESSION/DISCECTOMY FUSION PLATING BONEGRAFT CERVICAL FOUR-FIVE CERVICAL FIVE-SIX;  Surgeon: Ashok Pall, MD;  Location: [facility];  Service: Neurosurgery;  Laterality:  N/A;  . BACK SURGERY     lower back with Dr. Christella Noa  . COLONOSCOPY WITH PROPOFOL N/A 03/22/2019   Procedure: COLONOSCOPY WITH PROPOFOL;   Surgeon: Danie Binder, MD;  Location: [facility];  Service: Endoscopy;  Laterality: N/A;  8:30am  . INGUINAL HERNIA REPAIR Right 12/22/2018   Procedure: HERNIA REPAIR INGUINAL ADULT WITH MESH;  Surgeon: Aviva Signs, MD;  Location: [facility];  Service: General;  Laterality: Right;  . POLYPECTOMY  03/22/2019   Procedure: POLYPECTOMY;  Surgeon: Danie Binder, MD;  Location: [facility];  Service: Endoscopy;;  Cold snare polypectomy cecal, hepatic flexure polyps x 2,descending colon  polyps x 3,  and rectal polyps x 3   . SHOULDER ARTHROSCOPY Right   . UPPER EXTREMITY VENOGRAPHY N/A 07/22/2016   Procedure: Upper Extremity Venography - bilateral arm;  Surgeon: Serafina Mitchell, MD;  Location: [place] [facility];  Service: Cardiovascular;  Laterality: N/A;    MEDICATIONS: Current Outpatient Medications on File Prior to Visit  Medication Sig Dispense Refill  . albuterol (VENTOLIN HFA) 108 (90 Base) MCG/ACT inhaler Inhale 1-2 puffs into the lungs every 6 (six) hours as needed for wheezing or shortness of breath.    Marland Kitchen atorvastatin (LIPITOR) 40 MG tablet Take 1 tablet (40 mg total) by mouth every evening. 90 tablet 3  . empagliflozin (JARDIANCE) 10 MG TABS tablet Take 10 mg by mouth daily before breakfast. 30 tablet 5  . lisinopril (ZESTRIL) 5 MG tablet Take 1 tablet (5 mg total) by mouth daily. 90 tablet 3  . metFORMIN (GLUCOPHAGE) 500 MG tablet Take 2 tablets (1,000 mg total) by mouth 2 (two) times daily with a meal. 180 tablet 3   No current facility-administered medications on file prior to visit.    ALLERGIES: No Known Allergies  FAMILY HISTORY: Family History  Problem Relation Age of Onset  . Colon cancer Neg Hx   . Colon polyps Neg Hx     SOCIAL HISTORY: Social History   Socioeconomic History  . Marital status: Married    Spouse name: Not on file  . Number of children: 11  . Years of education: Not on file  . Highest education level: Not on file  Occupational History  .  Not on file  Tobacco Use  . Smoking status: Former Smoker    Packs/day: 0.25    Years: 40.00    Pack years: 10.00    Types: Cigarettes    Quit date: 12/19/2017    Years since quitting: 1.7  . Smokeless tobacco: Never Used  . Tobacco comment: quit 11/2017 (as of 12/08/2018)  Substance and Sexual Activity  . Alcohol use: Not Currently    Comment: no ETOH since since 2016 (as of 12/08/2018)  . Drug use: No  . Sexual activity: Not Currently  Other Topics Concern  . Not on file  Social History Narrative  . Not on file   Social Determinants of Health   Financial Resource Strain:   . Difficulty of Paying Living Expenses:   Food Insecurity:   . Worried About Charity fundraiser in the Last Year:   . Arboriculturist in the Last Year:   Transportation Needs:   . Film/video editor (Medical):   Marland Kitchen Lack of Transportation (Non-Medical):   Physical Activity:   . Days of Exercise per Week:   . Minutes of Exercise per Session:   Stress:   . Feeling of Stress :   Social Connections:   .  Frequency of Communication with Friends and Family:   . Frequency of Social Gatherings with Friends and Family:   . Attends Religious Services:   . Active Member of Clubs or Organizations:   . Attends Archivist Meetings:   Marland Kitchen Marital Status:   Intimate Partner Violence:   . Fear of Current or Ex-Partner:   . Emotionally Abused:   Marland Kitchen Physically Abused:   . Sexually Abused:     PHYSICAL EXAM: Blood pressure (!) 150/88, pulse 71, height 5\' 3"  (1.6 m), weight 152 lb 9.6 oz (69.2 kg), SpO2 98 %. General: No acute distress.  Patient appears well-groomed.  Head:  Normocephalic/atraumatic Eyes:  fundi examined but not visualized Neck: supple, right sided suboccipital and paraspinal tenderness to palpation, full range of motion Back: No paraspinal tenderness Heart: regular rate and rhythm Lungs: Clear to auscultation bilaterally. Vascular: No carotid bruits. Neurological Exam: Mental status:  alert and oriented to person, place, and time, recent and remote memory intact, fund of knowledge intact, attention and concentration intact, speech fluent and not dysarthric, language intact. Cranial nerves: CN I: not tested CN II: pupils equal, round and reactive to light, visual fields intact CN III, IV, VI:  full range of motion, no nystagmus, no ptosis CN V: facial sensation intact CN VII: upper and lower face symmetric CN VIII: hearing intact CN IX, X: gag intact, uvula midline CN XI: sternocleidomastoid and trapezius muscles intact CN XII: tongue midline Bulk & Tone: normal, no fasciculations. Motor:  4+/5 left hand grip and left lower extremity. Sensation:  Pinprick and vibration sensation intact. Deep Tendon Reflexes:  3+ upper extremities with left Hoffman's sign; 2+ lower extremities with bilateral Babinski sign Finger to nose testing:  Without dysmetria.  Gait:  Antalgic, ambulates with cane.  Romberg with sway.  IMPRESSION: 1.  New onset right sided headache with: 2.  Blurred vision 3.  Tinnitus in right ear  4.  Upper motor neuron findings (hyperreflexia, Hoffman sign, Babinski sign).  May be residual from prior cervical spine history.  He had cervical fusion in 2016, I suspect due to spinal stenosis which may have caused myelopathy).  However, we need to evaluate for any recurrent spinal stenosis causing headache and contributing to hyperreflexia. Due to these constellation of symptoms (new onset headache, blurred vision, tinnitus), I think evaluating for an acute intracranial abnormality (such as mass lesion) and intracranial and extracranial arterial etiology (aneurysm, dissection) is warranted.  PLAN: 1.  MRI of brain with and without contrast; MRI cervical spine without contrast, MRA of head and neck 2.  To help reduce headache frequency and help sleep, amitriptyline 10mg  at bedtime for one week, then 20mg  at bedtime.  For refill, we can continue increasing to 50mg  at  bedtime if needed. 3.  Limit use of pain relievers to no more than 2 days out of week to prevent risk of rebound or medication-overuse headache. 4.  Refer to ophthalmology 5.  Follow up in 4 months.  Further recommendations pending results  Thank you for allowing me to take part in the care of this patient.  Metta Clines, DO  CC: Caryl Pina, MD

## 2019-09-20 ENCOUNTER — Telehealth: Payer: Self-pay | Admitting: Family Medicine

## 2019-09-20 ENCOUNTER — Encounter: Payer: Self-pay | Admitting: Neurology

## 2019-09-20 ENCOUNTER — Other Ambulatory Visit: Payer: Self-pay

## 2019-09-20 ENCOUNTER — Ambulatory Visit (INDEPENDENT_AMBULATORY_CARE_PROVIDER_SITE_OTHER): Payer: Medicare Other | Admitting: Neurology

## 2019-09-20 VITALS — BP 150/88 | HR 71 | Ht 63.0 in | Wt 152.6 lb

## 2019-09-20 DIAGNOSIS — H9311 Tinnitus, right ear: Secondary | ICD-10-CM | POA: Diagnosis not present

## 2019-09-20 DIAGNOSIS — H538 Other visual disturbances: Secondary | ICD-10-CM

## 2019-09-20 DIAGNOSIS — R519 Headache, unspecified: Secondary | ICD-10-CM | POA: Diagnosis not present

## 2019-09-20 DIAGNOSIS — R292 Abnormal reflex: Secondary | ICD-10-CM | POA: Diagnosis not present

## 2019-09-20 DIAGNOSIS — H93A1 Pulsatile tinnitus, right ear: Secondary | ICD-10-CM

## 2019-09-20 MED ORDER — AMITRIPTYLINE HCL 10 MG PO TABS: ORAL_TABLET | ORAL | 0 refills | Status: DC

## 2019-09-20 NOTE — Chronic Care Management (AMB) (Signed)
  Chronic Care Management   Note  09/20/2019 Name: Rodrecus Belsky MRN: [id number] DOB: 07/11/1950  Deckard Stuber is a 69 y.o. year old male who is a primary care patient of Dettinger, Fransisca Kaufmann, MD. I reached out to [place] Ruiz-Guerrero by phone today in response to a referral sent by Mr. [place]field Ruiz-Guerrero's health plan.     Mr. Piech was given information about Chronic Care Management services today including:  1. CCM service includes personalized support from designated clinical staff supervised by his physician, including individualized plan of care and coordination with other care providers 2. 24/7 contact phone numbers for assistance for urgent and routine care needs. 3. Service will only be billed when office clinical staff spend 20 minutes or more in a month to coordinate care. 4. Only one practitioner may furnish and bill the service in a calendar month. 5. The patient may stop CCM services at any time (effective at the end of the month) by phone call to the office staff. 6. The patient will be responsible for cost sharing (co-pay) of up to 20% of the service fee (after annual deductible is met).  Patient agreed to services and verbal consent obtained.   Follow up plan: Telephone appointment with care management team member scheduled for:10/17/2019.   [place],  [id number] Direct Dial: [phone] [email or web address]: [email or web address]

## 2019-09-20 NOTE — Patient Instructions (Addendum)
1.  Start amitriptyline 10mg  tablet.  Take 1 tablet at bedtime for one week, then 2 tablets at bedtime. If headaches not improved by end of prescription, contact me and we can increase dose.  This may help with sleep as well. 2.  Limit use of pain relievers to no more than 2 days out of week to prevent risk of rebound or medication-overuse headache. 3.  We will check MRI of brain and cervical spine and MRA of head and neck. We have sent a referral to [facility] for your MRI and they will call you directly to schedule your appointment. They are located at [place]. If you need to contact them directly please call [phone].  4.  Referral to ophthalmology 5.  Follow up in 4 months.  Further recommendations after testing completed.

## 2019-09-22 ENCOUNTER — Other Ambulatory Visit: Payer: Self-pay

## 2019-09-22 ENCOUNTER — Encounter (HOSPITAL_COMMUNITY)
Admission: RE | Admit: 2019-09-22 | Discharge: 2019-09-22 | Disposition: A | Discharge status: Home / Self Care | Payer: Medicare Other | Source: Ambulatory Visit | Attending: Urology | Admitting: Urology

## 2019-09-22 DIAGNOSIS — Z8546 Personal history of malignant neoplasm of prostate: Secondary | ICD-10-CM | POA: Diagnosis not present

## 2019-09-22 DIAGNOSIS — R9721 Rising PSA following treatment for malignant neoplasm of prostate: Secondary | ICD-10-CM | POA: Insufficient documentation

## 2019-09-22 DIAGNOSIS — C61 Malignant neoplasm of prostate: Secondary | ICD-10-CM | POA: Diagnosis not present

## 2019-09-23 ENCOUNTER — Ambulatory Visit: Payer: Medicare Other | Admitting: Pharmacist

## 2019-09-25 ENCOUNTER — Other Ambulatory Visit: Payer: Self-pay | Admitting: Family Medicine

## 2019-09-26 ENCOUNTER — Other Ambulatory Visit: Payer: Self-pay

## 2019-09-26 ENCOUNTER — Other Ambulatory Visit: Payer: Self-pay | Admitting: Family Medicine

## 2019-09-26 ENCOUNTER — Ambulatory Visit (INDEPENDENT_AMBULATORY_CARE_PROVIDER_SITE_OTHER): Payer: Medicare Other | Admitting: Pharmacist

## 2019-09-26 DIAGNOSIS — E1169 Type 2 diabetes mellitus with other specified complication: Secondary | ICD-10-CM

## 2019-09-26 MED ORDER — JARDIANCE 25 MG PO TABS: 25.0000 mg | ORAL_TABLET | Freq: Every day | ORAL | 3 refills | Status: DC

## 2019-09-26 MED ORDER — GLUCOSE BLOOD VI STRP: ORAL_STRIP | 12 refills | Status: AC

## 2019-09-26 NOTE — Progress Notes (Signed)
    09/26/2019 Name: Chase [name] MRN: [id number] DOB: 1950/10/02  Referred by: Dettinger, Fransisca Kaufmann, MD Reason for referral : Diabetes       Pharmacy Clinic-Diabetes  S: 68 yoM Patient arrives with translator for diabetes evaluation, education, and management.  Patient was referred and last seen by Primary Care Provider on 07/28/19. Insurance coverage/medication affordability: medicaid/medicare   Patient denies adherence with medications.  Current diabetes medications include: metformin, (jardiance--was not taking)  Current hypertension medications include: lisinopril  Current hyperlipidemia medications include: atorvastatin  Patient denies hypoglycemic events, but is unsure due to inability to check BGs.  Patient reported dietary habits: Eats 3 meals/day.  Discussed decreasing carbs/sugars.  Will address more dietary options at next visit  Patient-reported exercise habits: n/a   O:  Lab Results  Component Value Date   HGBA1C 10.9 (H) 07/28/2019   Lipid Panel     Component Value Date/Time   CHOL 159 07/28/2019 0910   TRIG 557 (HH) 07/28/2019 0910   HDL 26 (L) 07/28/2019 0910   CHOLHDL 6.1 (H) 07/28/2019 0910   LDLCALC 51 07/28/2019 0910    Home fasting blood sugars: has not been checking BGs, FBG today was 235  2 hour post-meal/random blood sugars: n/a.    A/P:  Diabetes longstanding T2DM currently uncontrolled--A1c 10.9%. Patient is not able to verbalize appropriate hypoglycemia management plan. Patient is not adherent with medication. Control is suboptimal due to inability to obtain refill/language barrier.  Patient does not have a glucometer to check BGs.  -Continue Metformin 1g BID  -Restarted SGLT2-I Jardiance (generic name: empagliflozin) 25mg  daily. Patient has not take medication since Feb 2021. Counseled on sick day rules for patient to hold Jardiance if sick and not eating.  -Consider GLP1 at next visit.  -Set up One touch verio in spanish for  patient.  He was able to demonstrate BG testing.  Encouraged patient to check BGs every morning or if symptomatic.  -Extensively discussed pathophysiology of diabetes, recommended lifestyle interventions, dietary effects on blood sugar control  -Counseled on s/sx of and management of hypoglycemia  -Next A1C anticipated 10/25/19.    Written patient instructions provided.  Total time in face to face counseling 31 minutes.   Follow up PCP Clinic Visit on 10/25/19.       Regina Eck, PharmD, BCPS Clinical Pharmacist, [place]  II Phone [phone]

## 2019-09-28 DIAGNOSIS — Z8546 Personal history of malignant neoplasm of prostate: Secondary | ICD-10-CM | POA: Diagnosis not present

## 2019-09-30 MED ORDER — FINGERSTIX LANCETS MISC: 3 refills | Status: DC

## 2019-10-04 ENCOUNTER — Other Ambulatory Visit: Payer: Self-pay | Admitting: *Deleted

## 2019-10-04 MED ORDER — FINGERSTIX LANCETS MISC: 3 refills | Status: DC

## 2019-10-07 ENCOUNTER — Other Ambulatory Visit: Payer: Self-pay | Admitting: Family Medicine

## 2019-10-07 DIAGNOSIS — H2513 Age-related nuclear cataract, bilateral: Secondary | ICD-10-CM | POA: Diagnosis not present

## 2019-10-07 DIAGNOSIS — H35033 Hypertensive retinopathy, bilateral: Secondary | ICD-10-CM | POA: Diagnosis not present

## 2019-10-07 DIAGNOSIS — H25013 Cortical age-related cataract, bilateral: Secondary | ICD-10-CM | POA: Diagnosis not present

## 2019-10-07 DIAGNOSIS — E119 Type 2 diabetes mellitus without complications: Secondary | ICD-10-CM | POA: Diagnosis not present

## 2019-10-07 DIAGNOSIS — R519 Headache, unspecified: Secondary | ICD-10-CM | POA: Diagnosis not present

## 2019-10-07 LAB — HM DIABETES EYE EXAM

## 2019-10-10 ENCOUNTER — Telehealth: Payer: Medicare Other

## 2019-10-11 DIAGNOSIS — Z8546 Personal history of malignant neoplasm of prostate: Secondary | ICD-10-CM | POA: Diagnosis not present

## 2019-10-11 DIAGNOSIS — C61 Malignant neoplasm of prostate: Secondary | ICD-10-CM | POA: Diagnosis not present

## 2019-10-17 ENCOUNTER — Other Ambulatory Visit: Payer: Self-pay | Admitting: *Deleted

## 2019-10-17 ENCOUNTER — Ambulatory Visit (INDEPENDENT_AMBULATORY_CARE_PROVIDER_SITE_OTHER): Payer: Medicare Other | Admitting: *Deleted

## 2019-10-17 ENCOUNTER — Telehealth: Payer: Self-pay | Admitting: *Deleted

## 2019-10-17 DIAGNOSIS — E1159 Type 2 diabetes mellitus with other circulatory complications: Secondary | ICD-10-CM | POA: Diagnosis not present

## 2019-10-17 DIAGNOSIS — E1169 Type 2 diabetes mellitus with other specified complication: Secondary | ICD-10-CM | POA: Diagnosis not present

## 2019-10-17 DIAGNOSIS — M4726 Other spondylosis with radiculopathy, lumbar region: Secondary | ICD-10-CM

## 2019-10-17 DIAGNOSIS — I10 Essential (primary) hypertension: Secondary | ICD-10-CM | POA: Diagnosis not present

## 2019-10-17 DIAGNOSIS — I152 Hypertension secondary to endocrine disorders: Secondary | ICD-10-CM

## 2019-10-17 MED ORDER — ONETOUCH DELICA LANCETS 33G MISC: 4 refills | Status: DC

## 2019-10-17 MED ORDER — MIRTAZAPINE 30 MG PO TABS: 30.0000 mg | ORAL_TABLET | Freq: Every day | ORAL | 2 refills | Status: DC

## 2019-10-17 MED ORDER — ONETOUCH DELICA LANCETS 33G MISC: 4 refills | Status: AC

## 2019-10-17 NOTE — Addendum Note (Signed)
Addended by: Caryl Pina on: 10/17/2019 03:54 PM   Modules accepted: Orders

## 2019-10-17 NOTE — Telephone Encounter (Signed)
Has already been sent to pharmacy 

## 2019-10-17 NOTE — Telephone Encounter (Signed)
10/17/2019  Chase [name] needs an Rx for lancets sent to [facility]. If you need to reach him, use the telephonic interpreter [phone]).

## 2019-10-17 NOTE — Addendum Note (Signed)
Addended by: Ilean China on: 10/17/2019 05:05 PM   Modules accepted: Orders

## 2019-10-17 NOTE — Chronic Care Management (AMB) (Signed)
Chronic Care Management   Initial Visit Note  10/17/2019 Name: Chase [name] MRN: [id number] DOB: 11/09/1950  Referred by: Dettinger, Fransisca Kaufmann, MD Reason for referral : Chronic Care Management (RN Initial Visit)   Chase [name] is a 69 y.o. year old male who is a primary care patient of Dettinger, Fransisca Kaufmann, MD. The CCM team was consulted for assistance with chronic disease management and care coordination needs related to DM, OA, HTN:  Review of patient status, including review of consultants reports, relevant laboratory and other test results, and collaboration with appropriate care team members and the patient's provider was performed as part of comprehensive patient evaluation and provision of chronic care management services.    Subjective: I spoke with Chase [name] by telephone through an approved telephonic spanish speaking interpreter today [phone]). We discussed management of his chronic medical conditions. He does report having some difficulty with transportation because multiple family members share a car. He is monitoring his blood sugar more regularly and is in need of a refill of lancets.   SDOH (Social Determinants of Health) assessments performed: Yes See Care Plan activities for detailed interventions related to SDOH  SDOH Interventions     Most Recent Value  SDOH Interventions  SDOH Interventions for the Following Domains  Transportation  Transportation Interventions  Other (Comment) [discussed [facility]ATS]       Objective: Outpatient Encounter Medications as of 10/17/2019  Medication Sig  . albuterol (VENTOLIN HFA) 108 (90 Base) MCG/ACT inhaler Inhale 1-2 puffs into the lungs every 6 (six) hours as needed for wheezing or shortness of breath.  Marland Kitchen amitriptyline (ELAVIL) 10 MG tablet Take 1 tablet at bedtime for one week, then 2 tablets at bedtime.  Marland Kitchen atorvastatin (LIPITOR) 40 MG tablet Take 1 tablet (40 mg total) by mouth every evening.  . Blood Glucose  Monitoring Suppl (ONETOUCH VERIO) w/Device KIT Use to test blood sugar daily  . empagliflozin (JARDIANCE) 25 MG TABS tablet Take 25 mg by mouth daily before breakfast.  . Fingerstix Lancets MISC Use to test blood sugars daily as directed.  Dx E11.69  . glucose blood test strip Use as instructed to test blood sugar once daily.  Please fill One touch verio test strips. DX:E11.69  . lisinopril (ZESTRIL) 5 MG tablet Take 1 tablet (5 mg total) by mouth daily.  . metFORMIN (GLUCOPHAGE) 500 MG tablet Take 2 tablets (1,000 mg total) by mouth 2 (two) times daily with a meal.  . tamsulosin (FLOMAX) 0.4 MG CAPS capsule Take 0.4 mg by mouth daily.   No facility-administered encounter medications on file as of 10/17/2019.    Lab Results  Component Value Date   HGBA1C 10.9 (H) 07/28/2019   HGBA1C 9.0 (H) 12/20/2018   HGBA1C 6.9 (H) 02/12/2018   Lab Results  Component Value Date   LDLCALC 51 07/28/2019   CREATININE 0.74 (L) 07/28/2019   BP Readings from Last 3 Encounters:  09/20/19 (!) 150/88  07/28/19 121/82  03/22/19 135/72     RN Care Plan   . Chronic Disease Management Needs       CARE PLAN ENTRY (see longtitudinal plan of care for additional care plan information)  Current Barriers:  . Chronic Disease Management support, education, and care coordination needs related to DM, OA, HTN  Clinical Goal(s) related to DM, OA, HTN:  Over the next 45 days, patient will:  . Work with the care management team to address educational, disease management, and care coordination needs  . Begin or continue self  health monitoring activities as directed today Measure and record cbg (blood glucose) twice times daily . Call provider office for new or worsened signs and symptoms Blood glucose findings outside established parameters . Call care management team with questions or concerns . Verbalize basic understanding of patient centered plan of care established today  Interventions related to DM, OA, HTN:    . Evaluation of current treatment plans and patient's adherence to plan as established by provider . Assessed patient understanding of disease states . Assessed patient's education and care coordination needs . Provided disease specific education to patient  . Collaborated with appropriate clinical care team members regarding patient needs . Reviewed upcoming appointments: Dr Warrick Parisian 10/25/19 . Discussed transportation concerns and resources  Patient Self Care Activities related to DM, OA, HTN:  . Patient is unable to independently self-manage chronic health conditions  Initial goal documentation         Plan:  The care management team will reach out to the patient again over the next 45 days.   Appointment with PCP: 10/25/19   Chong Sicilian, BSN, RN-BC [place] / [place] Management Direct Dial: [phone]

## 2019-10-17 NOTE — Telephone Encounter (Signed)
Fax from [facility] for Mirtazapine 30 mg No longer on med list since 07/28/19 Next OV 10/25/19

## 2019-10-17 NOTE — Patient Instructions (Signed)
Visit Information  Goals Addressed            This Visit's Progress     Patient Stated   . Chronic Disease Management Needs (pt-stated)       CARE PLAN ENTRY (see longtitudinal plan of care for additional care plan information)  Current Barriers:  . Chronic Disease Management support, education, and care coordination needs related to DM, OA, HTN  Clinical Goal(s) related to DM, OA, HTN:  Over the next 45 days, patient will:  . Work with the care management team to address educational, disease management, and care coordination needs  . Begin or continue self health monitoring activities as directed today Measure and record cbg (blood glucose) twice times daily . Call provider office for new or worsened signs and symptoms Blood glucose findings outside established parameters . Call care management team with questions or concerns . Verbalize basic understanding of patient centered plan of care established today  Interventions related to DM, OA, HTN:  . Evaluation of current treatment plans and patient's adherence to plan as established by provider . Assessed patient understanding of disease states . Assessed patient's education and care coordination needs . Provided disease specific education to patient  . Collaborated with appropriate clinical care team members regarding patient needs . Reviewed upcoming appointments: Dr Warrick Parisian 10/25/19 . Discussed transportation concerns and resources  Patient Self Care Activities related to DM, OA, HTN:  . Patient is unable to independently self-manage chronic health conditions  Initial goal documentation        Chase [name] was given information about Chronic Care Management services today including:  1. CCM service includes personalized support from designated clinical staff supervised by his physician, including individualized plan of care and coordination with other care providers 2. 24/7 contact phone numbers for assistance  for urgent and routine care needs. 3. Service will only be billed when office clinical staff spend 20 minutes or more in a month to coordinate care. 4. Only one practitioner may furnish and bill the service in a calendar month. 5. The patient may stop CCM services at any time (effective at the end of the month) by phone call to the office staff. 6. The patient will be responsible for cost sharing (co-pay) of up to 20% of the service fee (after annual deductible is met).  Patient agreed to services and verbal consent obtained.   The patient verbalized understanding of instructions provided today and declined a print copy of patient instruction materials.   The care management team will reach out to the patient again over the next 45 days.   PCP appointment on 10/25/19  Chong Sicilian, BSN, RN-BC [place] / [place] Management Direct Dial: [phone]

## 2019-10-17 NOTE — Telephone Encounter (Signed)
Gave number around to get through the visit, we will follow up at visit.

## 2019-10-18 DIAGNOSIS — C61 Malignant neoplasm of prostate: Secondary | ICD-10-CM | POA: Diagnosis not present

## 2019-10-18 DIAGNOSIS — Z8546 Personal history of malignant neoplasm of prostate: Secondary | ICD-10-CM | POA: Diagnosis not present

## 2019-10-25 ENCOUNTER — Other Ambulatory Visit: Payer: Self-pay

## 2019-10-25 ENCOUNTER — Encounter: Payer: Self-pay | Admitting: Family Medicine

## 2019-10-25 ENCOUNTER — Ambulatory Visit (INDEPENDENT_AMBULATORY_CARE_PROVIDER_SITE_OTHER): Payer: Medicare Other | Admitting: Family Medicine

## 2019-10-25 VITALS — BP 110/74 | HR 89 | Temp 98.0°F | Ht 63.0 in | Wt 148.2 lb

## 2019-10-25 DIAGNOSIS — E785 Hyperlipidemia, unspecified: Secondary | ICD-10-CM | POA: Diagnosis not present

## 2019-10-25 DIAGNOSIS — C61 Malignant neoplasm of prostate: Secondary | ICD-10-CM | POA: Insufficient documentation

## 2019-10-25 DIAGNOSIS — I10 Essential (primary) hypertension: Secondary | ICD-10-CM

## 2019-10-25 DIAGNOSIS — I152 Hypertension secondary to endocrine disorders: Secondary | ICD-10-CM

## 2019-10-25 DIAGNOSIS — E1159 Type 2 diabetes mellitus with other circulatory complications: Secondary | ICD-10-CM

## 2019-10-25 DIAGNOSIS — E1169 Type 2 diabetes mellitus with other specified complication: Secondary | ICD-10-CM | POA: Diagnosis not present

## 2019-10-25 DIAGNOSIS — Z23 Encounter for immunization: Secondary | ICD-10-CM

## 2019-10-25 LAB — BAYER DCA HB A1C WAIVED: HB A1C (BAYER DCA - WAIVED): 8.2 % — ABNORMAL HIGH (ref <7.0)

## 2019-10-25 MED ORDER — GLIPIZIDE 5 MG PO TABS: 5.0000 mg | ORAL_TABLET | Freq: Two times a day (BID) | ORAL | 3 refills | Status: DC

## 2019-10-25 NOTE — Addendum Note (Signed)
Addended by: Milas Hock on: 10/25/2019 12:03 PM   Modules accepted: Orders

## 2019-10-25 NOTE — Progress Notes (Signed)
BP 110/74   Pulse 89   Temp 98 F (36.7 C) (Temporal)   Ht 5' 3" (1.6 m)   Wt 148 lb 4 oz (67.2 kg)   BMI 26.26 kg/m    Subjective:   Patient ID: Chase [name], male    DOB: 08-27-1950, 69 y.o.   MRN: [id number]  HPI: Chase [name] is a 69 y.o. male presenting on 10/25/2019 for Medical Management of Chronic Issues   HPI Type 2 diabetes mellitus Patient comes in today for recheck of his diabetes. Patient has been currently taking metformin and Jardiance. Patient is currently on an ACE inhibitor/ARB. Patient has seen an ophthalmologist this year. Patient denies any issues with their feet. The symptom started onset as an adult hypertension and hyperlipidemia ARE RELATED TO DM   Hypertension Patient is currently on lisinopril, and their blood pressure today is 110/74. Patient denies any lightheadedness or dizziness. Patient denies headaches, blurred vision, chest pains, shortness of breath, or weakness. Denies any side effects from medication and is content with current medication.   Hyperlipidemia Patient is coming in for recheck of his hyperlipidemia. The patient is currently taking atorvastatin. They deny any issues with myalgias or history of liver damage from it. They deny any focal numbness or weakness or chest pain.   Patient has prostate cancer and sees Dr. Karsten Ro  Depression Patient says he was feeling a lot depressed and anxiety and stress related to waiting for his prostate cancer biopsy but he said the last biopsy came back good and he is feeling a lot better.  He says is for a lot of stress was, and is not really worried about it right now.  He has been taking amitriptyline for headache prevention and it seems to be helping and he has follow-up with Dr. Tomi Likens Depression screen [facility] 2/9 10/25/2019 11/16/2018 08/12/2018 05/03/2018 04/06/2018  Decreased Interest 3 0 0 0 0  Down, Depressed, Hopeless 3 0 0 0 0  PHQ - 2 Score 6 0 0 0 0  Altered sleeping 2 - - - -  Tired,  decreased energy 3 - - - -  Change in appetite 3 - - - -  Feeling bad or failure about yourself  2 - - - -  Trouble concentrating 1 - - - -  Moving slowly or fidgety/restless 0 - - - -  Suicidal thoughts 1 - - - -  PHQ-9 Score 18 - - - -  Difficult doing work/chores Somewhat difficult - - - -     Relevant past medical, surgical, family and social history reviewed and updated as indicated. Interim medical history since our last visit reviewed. Allergies and medications reviewed and updated.  Review of Systems  Constitutional: Negative for chills and fever.  Eyes: Negative for visual disturbance.  Respiratory: Negative for shortness of breath and wheezing.   Cardiovascular: Negative for chest pain and leg swelling.  Musculoskeletal: Negative for back pain and gait problem.  Skin: Negative for rash.  Psychiatric/Behavioral: Positive for dysphoric mood. Negative for self-injury, sleep disturbance and suicidal ideas. The patient is nervous/anxious.   All other systems reviewed and are negative.   Per HPI unless specifically indicated above   Allergies as of 10/25/2019   No Known Allergies     Medication List       Accurate as of Oct 25, 2019 11:12 AM. If you have any questions, ask your nurse or doctor.        albuterol 108 (90 Base) MCG/ACT inhaler  Commonly known as: VENTOLIN HFA Inhale 1-2 puffs into the lungs every 6 (six) hours as needed for wheezing or shortness of breath.   amitriptyline 10 MG tablet Commonly known as: ELAVIL Take 1 tablet at bedtime for one week, then 2 tablets at bedtime.   atorvastatin 40 MG tablet Commonly known as: LIPITOR Take 1 tablet (40 mg total) by mouth every evening.   glipiZIDE 5 MG tablet Commonly known as: GLUCOTROL Take 1 tablet (5 mg total) by mouth 2 (two) times daily before a meal. Started by: Fransisca Kaufmann Nalina Yeatman, MD   glucose blood test strip Use as instructed to test blood sugar once daily.  Please fill One touch verio test  strips. DX:E11.69   Jardiance 25 MG Tabs tablet Generic drug: empagliflozin Take 25 mg by mouth daily before breakfast.   lisinopril 5 MG tablet Commonly known as: ZESTRIL Take 1 tablet (5 mg total) by mouth daily.   metFORMIN 500 MG tablet Commonly known as: GLUCOPHAGE Take 2 tablets (1,000 mg total) by mouth 2 (two) times daily with a meal.   mirtazapine 30 MG tablet Commonly known as: Remeron Take 1 tablet (30 mg total) by mouth at bedtime.   OneTouch Delica Lancets 62I Misc Use to check blood sugar 2 times daily. E11.69   OneTouch Verio w/Device Kit Use to test blood sugar daily   tamsulosin 0.4 MG Caps capsule Commonly known as: FLOMAX Take 0.4 mg by mouth daily.        Objective:   BP 110/74   Pulse 89   Temp 98 F (36.7 C) (Temporal)   Ht 5' 3" (1.6 m)   Wt 148 lb 4 oz (67.2 kg)   BMI 26.26 kg/m   Wt Readings from Last 3 Encounters:  10/25/19 148 lb 4 oz (67.2 kg)  09/20/19 152 lb 9.6 oz (69.2 kg)  07/28/19 156 lb 9.6 oz (71 kg)    Physical Exam Vitals and nursing note reviewed.  Constitutional:      General: He is not in acute distress.    Appearance: He is well-developed. He is not diaphoretic.  Eyes:     General: No scleral icterus.    Conjunctiva/sclera: Conjunctivae normal.  Neck:     Thyroid: No thyromegaly.  Cardiovascular:     Rate and Rhythm: Normal rate and regular rhythm.     Heart sounds: Normal heart sounds. No murmur.  Pulmonary:     Effort: Pulmonary effort is normal. No respiratory distress.     Breath sounds: Normal breath sounds. No wheezing.  Musculoskeletal:        General: Normal range of motion.     Cervical back: Neck supple.  Lymphadenopathy:     Cervical: No cervical adenopathy.  Skin:    General: Skin is warm and dry.     Findings: No rash.  Neurological:     Mental Status: He is alert and oriented to person, place, and time.     Coordination: Coordination normal.  Psychiatric:        Behavior: Behavior  normal.       Assessment & Plan:   Problem List Items Addressed This Visit      Cardiovascular and Mediastinum   Hypertension associated with diabetes ([place])   Relevant Medications   glipiZIDE (GLUCOTROL) 5 MG tablet     Endocrine   Type 2 diabetes mellitus with other specified complication (HCC) - Primary   Relevant Medications   glipiZIDE (GLUCOTROL) 5 MG tablet   Other Relevant  Orders   Bayer DCA Hb A1c Waived   Hyperlipidemia associated with type 2 diabetes mellitus (HCC)   Relevant Medications   glipiZIDE (GLUCOTROL) 5 MG tablet     Genitourinary   Prostate cancer (HCC)      Added glipizide to his regiment of Metformin and Jardiance  Patient's PHQ was high but he says is feeling a lot better now that his prostate cancer results just came back is good. Follow up plan: Return in about 3 months (around 01/25/2020), or if symptoms worsen or fail to improve, for diabetes and htn, hld.  Counseling provided for all of the vaccine components Orders Placed This Encounter  Procedures  . [facility] Hb A1c [employer], MD [facility] 10/25/2019, 11:12 AM

## 2019-10-30 IMAGING — DX DG CHEST 2V
2 series · 2 of 2 positions shown · non-contrast
Comparison: CT chest dated 02/21/2018

CLINICAL DATA: Shortness of breath, right chest pain

EXAM:
CHEST - 2 VIEW

[chest pa]
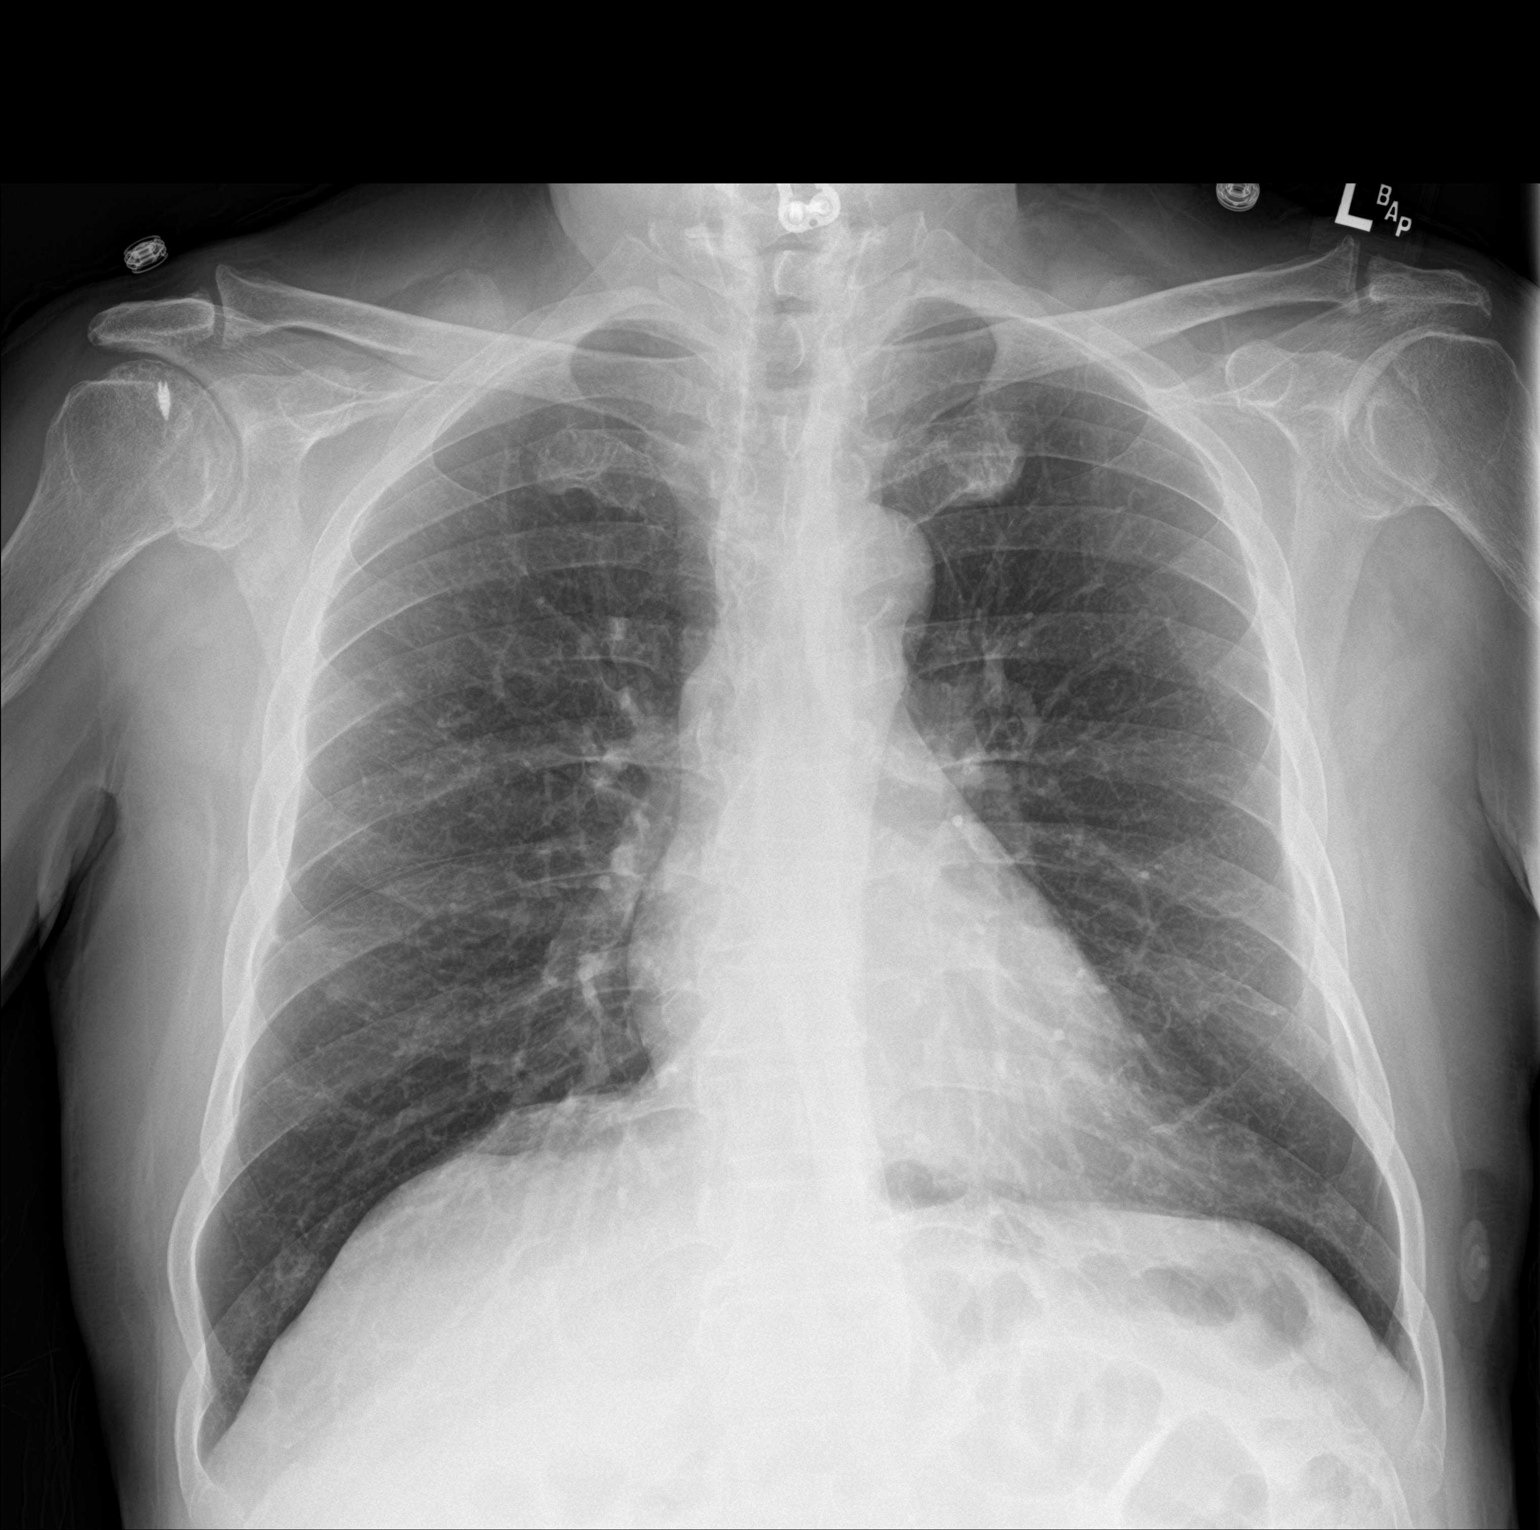

[chest lat]
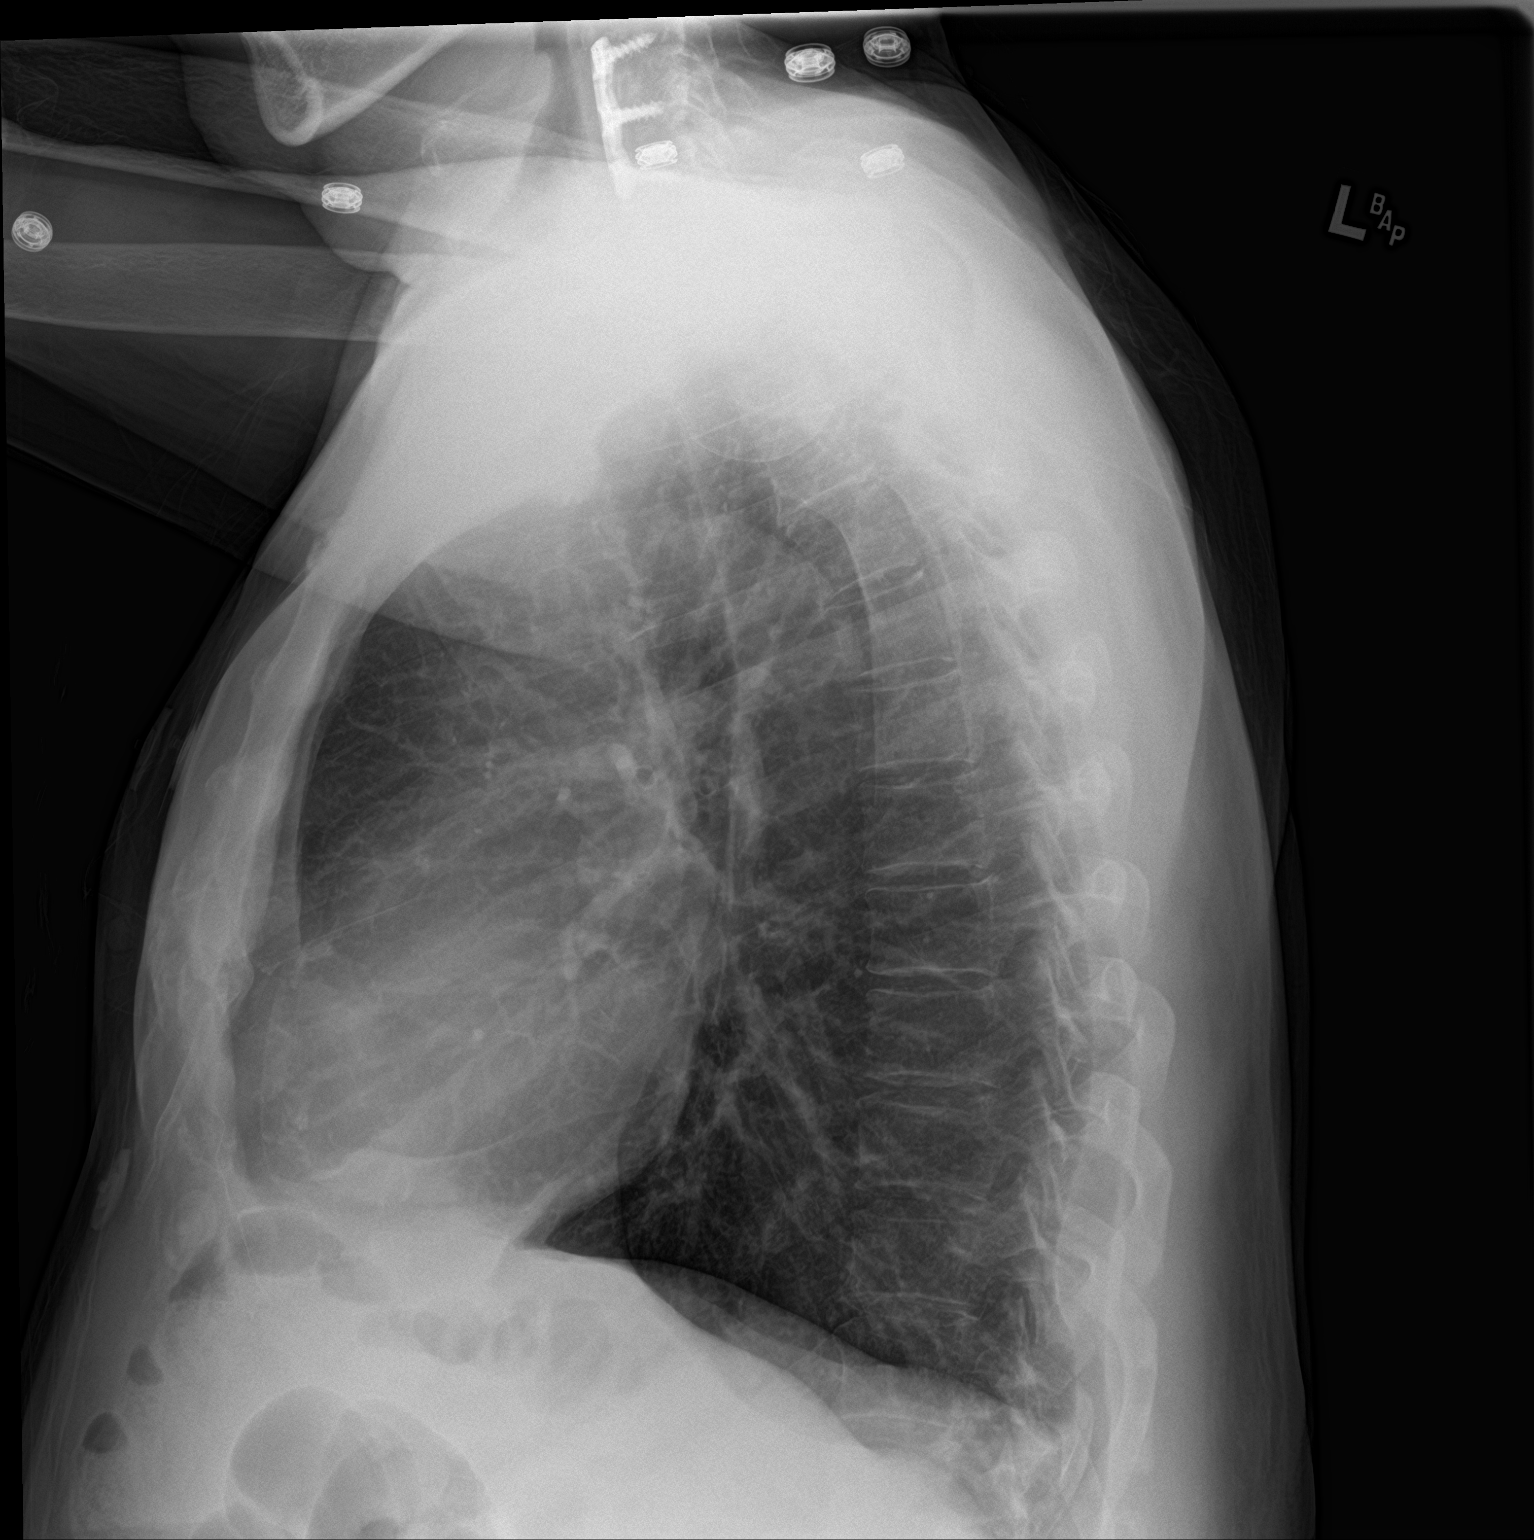

[2 of 2 positions shown; findings below may reference images not displayed]

FINDINGS: Lungs are clear.  No pleural effusion or pneumothorax.

The heart is normal in size.

Cervical spine fixation hardware.
IMPRESSION: No evidence of acute cardiopulmonary disease.

## 2019-11-10 ENCOUNTER — Other Ambulatory Visit: Payer: Medicare Other

## 2019-11-14 ENCOUNTER — Other Ambulatory Visit: Payer: Medicare Other

## 2019-11-23 ENCOUNTER — Telehealth: Payer: Self-pay | Admitting: Family Medicine

## 2019-11-23 NOTE — Chronic Care Management (AMB) (Signed)
  Care Management   Note  11/23/2019 Name: Chase [name] MRN: [id number] DOB: 05-01-1951  Chase [name] is a 69 y.o. year old male who is a primary care patient of Dettinger, Fransisca Kaufmann, MD and is actively engaged with the care management team. I reached out to [place] Ruiz-Guerrero by phone today to assist with re-scheduling a follow up visit with the RN Case Manager  Follow up plan: Telephone appointment with care management team member scheduled for:12/02/2019  Noreene Larsson, [place], [place], [place] [address] Direct Dial: [phone] [email or web address]: [email or web address]

## 2019-11-29 ENCOUNTER — Telehealth: Payer: Medicare Other

## 2019-12-02 ENCOUNTER — Ambulatory Visit: Payer: Medicare Other | Admitting: *Deleted

## 2019-12-02 DIAGNOSIS — E1159 Type 2 diabetes mellitus with other circulatory complications: Secondary | ICD-10-CM

## 2019-12-02 DIAGNOSIS — E1169 Type 2 diabetes mellitus with other specified complication: Secondary | ICD-10-CM

## 2019-12-02 NOTE — Chronic Care Management (AMB) (Signed)
  Chronic Care Management   Follow-up Outreach  12/02/2019 Name: Chase [name] MRN: [id number] DOB: 23-Dec-1950  Referred by: Dettinger, Fransisca Kaufmann, MD Reason for referral : Chronic Care Management (RN follow up)   An unsuccessful telephone follow-up was attempted today through the use of [employer] (interpreter ID [phone]). The patient was referred to the case management team for assistance with care management and care coordination.   Follow Up Plan:   A HIPPA compliant phone message was left for the patient through the use of a telephonic interpreter service, [employer], advising that I will reach back out via telephone again over the next 2 weeks  The care management team will reach out to the patient again over the next 14 days.   Chong Sicilian, BSN, RN-BC Embedded Chronic Care Manager [facility] / [place] [facility] Direct Dial: [phone]

## 2019-12-12 ENCOUNTER — Telehealth: Payer: Medicare Other

## 2019-12-14 ENCOUNTER — Ambulatory Visit: Payer: Medicare Other | Admitting: *Deleted

## 2019-12-14 DIAGNOSIS — E1159 Type 2 diabetes mellitus with other circulatory complications: Secondary | ICD-10-CM

## 2019-12-14 DIAGNOSIS — E1169 Type 2 diabetes mellitus with other specified complication: Secondary | ICD-10-CM

## 2020-01-09 NOTE — Chronic Care Management (AMB) (Signed)
  Chronic Care Management   Outreach Note 12/14/2019 Name: Chase [name] MRN: [id number] DOB: 1951-05-10  Referred by: Dettinger, Fransisca Kaufmann, MD Reason for referral : Chronic Care Management (RN follow up)   An unsuccessful telephone follow-up was attempted today. The patient was referred to the case management team for assistance with care management and care coordination.   Follow Up Plan: The care management team will reach out to the patient again over the next 45 days.   Chong Sicilian, BSN, RN-BC Embedded Chronic Care Manager [facility] / [place] [facility] Direct Dial: [phone]

## 2020-01-26 ENCOUNTER — Encounter: Payer: Self-pay | Admitting: Family Medicine

## 2020-01-26 ENCOUNTER — Ambulatory Visit (INDEPENDENT_AMBULATORY_CARE_PROVIDER_SITE_OTHER): Payer: Medicare Other | Admitting: Family Medicine

## 2020-01-26 ENCOUNTER — Other Ambulatory Visit: Payer: Self-pay

## 2020-01-26 VITALS — BP 110/75 | HR 76 | Temp 97.3°F | Ht 63.0 in | Wt 145.0 lb

## 2020-01-26 DIAGNOSIS — E785 Hyperlipidemia, unspecified: Secondary | ICD-10-CM | POA: Diagnosis not present

## 2020-01-26 DIAGNOSIS — I10 Essential (primary) hypertension: Secondary | ICD-10-CM | POA: Diagnosis not present

## 2020-01-26 DIAGNOSIS — M5432 Sciatica, left side: Secondary | ICD-10-CM | POA: Diagnosis not present

## 2020-01-26 DIAGNOSIS — E1169 Type 2 diabetes mellitus with other specified complication: Secondary | ICD-10-CM

## 2020-01-26 DIAGNOSIS — E1159 Type 2 diabetes mellitus with other circulatory complications: Secondary | ICD-10-CM

## 2020-01-26 LAB — BAYER DCA HB A1C WAIVED: HB A1C (BAYER DCA - WAIVED): 6.3 % (ref <7.0)

## 2020-01-26 MED ORDER — KETOROLAC TROMETHAMINE 60 MG/2ML IM SOLN: 60.0000 mg | Freq: Once | INTRAMUSCULAR | Status: AC

## 2020-01-26 NOTE — Progress Notes (Signed)
BP 110/75   Pulse 76   Temp (!) 97.3 F (36.3 C)   Ht '5\' 3"'  (1.6 m)   Wt 145 lb (65.8 kg)   SpO2 98%   BMI 25.69 kg/m    Subjective:   Patient ID: Chase [name], male    DOB: 1950/10/09, 69 y.o.   MRN: [id number]  HPI: Chase [name] is a 69 y.o. male presenting on 01/26/2020 for Medical Management of Chronic Issues and Diabetes   HPI Type 2 diabetes mellitus Patient comes in today for recheck of his diabetes. Patient has been currently taking Jardiance and glipizide and metformin and A1c is 6.3 today. Patient is currently on an ACE inhibitor/ARB. Patient has not seen an ophthalmologist this year. Patient denies any issues with their feet. The symptom started onset as an adult hypertension hyperlipidemia ARE RELATED TO DM   Hypertension Patient is currently on lisinopril, and their blood pressure today is 110/75. Patient denies any lightheadedness or dizziness. Patient denies headaches, blurred vision, chest pains, shortness of breath, or weakness. Denies any side effects from medication and is content with current medication.   Hyperlipidemia Patient is coming in for recheck of his hyperlipidemia. The patient is currently taking atorvastatin. They deny any issues with myalgias or history of liver damage from it. They deny any focal numbness or weakness or chest pain.   Patient comes in complaining of left sciatic nerve pain that goes down his leg sometimes to his foot he has been having.  He has been having this for at least a few months and says is not getting better or worse but just not improving.  He says it comes and goes.  Relevant past medical, surgical, family and social history reviewed and updated as indicated. Interim medical history since our last visit reviewed. Allergies and medications reviewed and updated.  Review of Systems  Constitutional: Negative for chills and fever.  Respiratory: Negative for shortness of breath and wheezing.   Cardiovascular:  Negative for chest pain and leg swelling.  Musculoskeletal: Positive for back pain. Negative for gait problem.  Skin: Negative for rash.  Neurological: Negative for weakness and numbness.  All other systems reviewed and are negative.   Per HPI unless specifically indicated above   Allergies as of 01/26/2020   No Known Allergies     Medication List       Accurate as of January 26, 2020  9:56 AM. If you have any questions, ask your nurse or doctor.        albuterol 108 (90 Base) MCG/ACT inhaler Commonly known as: VENTOLIN HFA Inhale 1-2 puffs into the lungs every 6 (six) hours as needed for wheezing or shortness of breath.   amitriptyline 10 MG tablet Commonly known as: ELAVIL Take 1 tablet at bedtime for one week, then 2 tablets at bedtime.   atorvastatin 40 MG tablet Commonly known as: LIPITOR Take 1 tablet (40 mg total) by mouth every evening.   glipiZIDE 5 MG tablet Commonly known as: GLUCOTROL Take 1 tablet (5 mg total) by mouth 2 (two) times daily before a meal.   glucose blood test strip Use as instructed to test blood sugar once daily.  Please fill One touch verio test strips. DX:E11.69   Jardiance 25 MG Tabs tablet Generic drug: empagliflozin Take 25 mg by mouth daily before breakfast.   lisinopril 5 MG tablet Commonly known as: ZESTRIL Take 1 tablet (5 mg total) by mouth daily.   metFORMIN 500 MG tablet Commonly known as:  GLUCOPHAGE Take 2 tablets (1,000 mg total) by mouth 2 (two) times daily with a meal.   mirtazapine 30 MG tablet Commonly known as: Remeron Take 1 tablet (30 mg total) by mouth at bedtime.   OneTouch Delica Lancets 11H Misc Use to check blood sugar 2 times daily. E11.69   OneTouch Verio w/Device Kit Use to test blood sugar daily   tamsulosin 0.4 MG Caps capsule Commonly known as: FLOMAX Take 0.4 mg by mouth daily.        Objective:   BP 110/75   Pulse 76   Temp (!) 97.3 F (36.3 C)   Ht '5\' 3"'  (1.6 m)   Wt 145 lb (65.8  kg)   SpO2 98%   BMI 25.69 kg/m   Wt Readings from Last 3 Encounters:  01/26/20 145 lb (65.8 kg)  10/25/19 148 lb 4 oz (67.2 kg)  09/20/19 152 lb 9.6 oz (69.2 kg)    Physical Exam Vitals and nursing note reviewed.  Constitutional:      General: He is not in acute distress.    Appearance: He is well-developed. He is not diaphoretic.  Eyes:     General: No scleral icterus.    Conjunctiva/sclera: Conjunctivae normal.  Neck:     Thyroid: No thyromegaly.  Cardiovascular:     Rate and Rhythm: Normal rate and regular rhythm.     Heart sounds: Normal heart sounds. No murmur heard.   Pulmonary:     Effort: Pulmonary effort is normal. No respiratory distress.     Breath sounds: Normal breath sounds. No wheezing.  Musculoskeletal:        General: Normal range of motion.     Cervical back: Neck supple.     Right knee: Normal.     Left knee: Crepitus present. No swelling. Tenderness present over the medial joint line. No LCL laxity, MCL laxity, ACL laxity or PCL laxity. Lymphadenopathy:     Cervical: No cervical adenopathy.  Skin:    General: Skin is warm and dry.     Findings: No rash.  Neurological:     Mental Status: He is alert and oriented to person, place, and time.     Coordination: Coordination normal.  Psychiatric:        Behavior: Behavior normal.       Assessment & Plan:   Problem List Items Addressed This Visit      Cardiovascular and Mediastinum   Hypertension associated with diabetes ([place])     Endocrine   Type 2 diabetes mellitus with other specified complication ([place]) - Primary   Relevant Orders   Bayer DCA Hb A1c Waived   Hyperlipidemia associated with type 2 diabetes mellitus ([place])    Other Visit Diagnoses    Sciatic nerve pain, left       Relevant Medications   ketorolac (TORADOL) injection 60 mg (Start on 01/26/2020 10:30 AM)      A1c 6.3, no change medication, blood sugar doing good  Continue current medications.  We will do a Toradol injection  to help with sciatic pain and follow-up after that if returns.  Follow up plan: Return in about 3 months (around 04/27/2020), or if symptoms worsen or fail to improve, for diabetes.  Counseling provided for all of the vaccine components Orders Placed This Encounter  Procedures  . Bayer [facility] Hb A1c [place], MD [facility] 01/26/2020, 9:56 AM
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
## 2020-01-30 ENCOUNTER — Ambulatory Visit (INDEPENDENT_AMBULATORY_CARE_PROVIDER_SITE_OTHER): Payer: Medicare Other | Admitting: Neurology

## 2020-01-30 ENCOUNTER — Encounter: Payer: Self-pay | Admitting: Neurology

## 2020-01-30 ENCOUNTER — Other Ambulatory Visit: Payer: Self-pay

## 2020-01-30 VITALS — BP 123/79 | HR 89 | Ht 64.0 in | Wt 151.8 lb

## 2020-01-30 DIAGNOSIS — R519 Headache, unspecified: Secondary | ICD-10-CM

## 2020-01-30 MED ORDER — AMITRIPTYLINE HCL 10 MG PO TABS: 20.0000 mg | ORAL_TABLET | Freq: Every day | ORAL | 5 refills | Status: DC

## 2020-01-30 NOTE — Patient Instructions (Signed)
1.  Start amitriptyline 10mg  tablet.  Take 1 tablet at bedtime for one week, then increase to 2 tablets at bedtime 2.  If headaches not improved in 4 weeks after starting medication, contact me and we can increase dose. 3.  Limit use of pain relievers to no more than 2 days out of week to prevent risk of rebound or medication-overuse headache. 4.  Keep a headache diary 5.  Follow up in 4 to 6 months.

## 2020-04-03 ENCOUNTER — Other Ambulatory Visit: Payer: Self-pay | Admitting: Family Medicine

## 2020-04-03 DIAGNOSIS — E1169 Type 2 diabetes mellitus with other specified complication: Secondary | ICD-10-CM

## 2020-04-16 ENCOUNTER — Other Ambulatory Visit: Payer: Self-pay | Admitting: Family Medicine

## 2020-04-17 DIAGNOSIS — R972 Elevated prostate specific antigen [PSA]: Secondary | ICD-10-CM | POA: Diagnosis not present

## 2020-04-18 ENCOUNTER — Other Ambulatory Visit: Payer: Self-pay | Admitting: Family Medicine

## 2020-04-24 DIAGNOSIS — R9721 Rising PSA following treatment for malignant neoplasm of prostate: Secondary | ICD-10-CM | POA: Diagnosis not present

## 2020-04-24 DIAGNOSIS — Z8546 Personal history of malignant neoplasm of prostate: Secondary | ICD-10-CM | POA: Diagnosis not present

## 2020-04-30 ENCOUNTER — Ambulatory Visit (INDEPENDENT_AMBULATORY_CARE_PROVIDER_SITE_OTHER): Payer: Medicare Other | Admitting: Family Medicine

## 2020-04-30 ENCOUNTER — Encounter: Payer: Self-pay | Admitting: Family Medicine

## 2020-04-30 ENCOUNTER — Other Ambulatory Visit: Payer: Self-pay

## 2020-04-30 VITALS — BP 129/84 | HR 71 | Temp 97.0°F | Ht 64.0 in | Wt 149.0 lb

## 2020-04-30 DIAGNOSIS — E1159 Type 2 diabetes mellitus with other circulatory complications: Secondary | ICD-10-CM

## 2020-04-30 DIAGNOSIS — Z23 Encounter for immunization: Secondary | ICD-10-CM

## 2020-04-30 DIAGNOSIS — I152 Hypertension secondary to endocrine disorders: Secondary | ICD-10-CM | POA: Diagnosis not present

## 2020-04-30 DIAGNOSIS — E785 Hyperlipidemia, unspecified: Secondary | ICD-10-CM | POA: Diagnosis not present

## 2020-04-30 DIAGNOSIS — E1169 Type 2 diabetes mellitus with other specified complication: Secondary | ICD-10-CM

## 2020-04-30 LAB — BAYER DCA HB A1C WAIVED: HB A1C (BAYER DCA - WAIVED): 8 % — ABNORMAL HIGH (ref <7.0)

## 2020-04-30 MED ORDER — ATORVASTATIN CALCIUM 40 MG PO TABS: 40.0000 mg | ORAL_TABLET | Freq: Every evening | ORAL | 3 refills | Status: DC

## 2020-04-30 MED ORDER — TAMSULOSIN HCL 0.4 MG PO CAPS
0.4000 mg | ORAL_CAPSULE | Freq: Every day | ORAL | 3 refills | Status: DC
Start: 2020-04-30 — End: 2020-11-08

## 2020-04-30 MED ORDER — LISINOPRIL 5 MG PO TABS: 5.0000 mg | ORAL_TABLET | Freq: Every day | ORAL | 3 refills | Status: DC

## 2020-04-30 MED ORDER — EMPAGLIFLOZIN 25 MG PO TABS
25.0000 mg | ORAL_TABLET | Freq: Every day | ORAL | 3 refills | Status: DC
Start: 2020-04-30 — End: 2021-02-01

## 2020-04-30 MED ORDER — GLIPIZIDE 5 MG PO TABS: 5.0000 mg | ORAL_TABLET | Freq: Two times a day (BID) | ORAL | 3 refills | Status: DC

## 2020-04-30 MED ORDER — METFORMIN HCL 500 MG PO TABS: 1000.0000 mg | ORAL_TABLET | Freq: Two times a day (BID) | ORAL | 3 refills | Status: DC

## 2020-04-30 NOTE — Progress Notes (Signed)
BP 129/84   Pulse 71   Temp (!) 97 F (36.1 C)   Ht '5\' 4"'  (1.626 m)   Wt 149 lb (67.6 kg)   BMI 25.58 kg/m    Subjective:   Patient ID: Chase [name], male    DOB: 1950/10/06, 69 y.o.   MRN: [id number]  HPI: Chase [name] is a 69 y.o. male presenting on 04/30/2020 for Medical Management of Chronic Issues and Diabetes   HPI Type 2 diabetes mellitus Patient comes in today for recheck of his diabetes. Patient has been currently taking Jardiance and glipizide and Metformin but had run out of the glipizide until just a week ago when he picked it up.  A1c is 8.0 today. Patient is currently on an ACE inhibitor/ARB. Patient has seen an ophthalmologist this year. Patient denies any issues with their feet. The symptom started onset as an adult hypertension and hyperlipidemia ARE RELATED TO DM   Hypertension Patient is currently on lisinopril, and their blood pressure today is 129/84. Patient denies any lightheadedness or dizziness. Patient denies headaches, blurred vision, chest pains, shortness of breath, or weakness. Denies any side effects from medication and is content with current medication.   Hyperlipidemia Patient is coming in for recheck of his hyperlipidemia. The patient is currently taking atorvastatin. They deny any issues with myalgias or history of liver damage from it. They deny any focal numbness or weakness or chest pain.   Patient sees urology for prostate numbers that have continued to rise and they are following up on them and takes Flomax.  He wants refill for Flomax today.  He has had prostate biopsies and scans and have not found any cancer but the numbers keep rising.  Relevant past medical, surgical, family and social history reviewed and updated as indicated. Interim medical history since our last visit reviewed. Allergies and medications reviewed and updated.  Review of Systems  Constitutional: Negative for chills and fever.  Eyes: Negative for visual  disturbance.  Respiratory: Negative for shortness of breath and wheezing.   Cardiovascular: Negative for chest pain and leg swelling.  Musculoskeletal: Negative for back pain and gait problem.  Skin: Negative for rash.  Neurological: Negative for dizziness, weakness and numbness.  All other systems reviewed and are negative.   Per HPI unless specifically indicated above   Allergies as of 04/30/2020   No Known Allergies     Medication List       Accurate as of April 30, 2020  9:10 AM. If you have any questions, ask your nurse or doctor.        STOP taking these medications   albuterol 108 (90 Base) MCG/ACT inhaler Commonly known as: VENTOLIN HFA Stopped by: Fransisca Kaufmann Saheed Carrington, MD   amitriptyline 10 MG tablet Commonly known as: ELAVIL Stopped by: Fransisca Kaufmann Lichelle Viets, MD     TAKE these medications   atorvastatin 40 MG tablet Commonly known as: LIPITOR Take 1 tablet (40 mg total) by mouth every evening.   empagliflozin 25 MG Tabs tablet Commonly known as: Jardiance Take 1 tablet (25 mg total) by mouth daily before breakfast.   glipiZIDE 5 MG tablet Commonly known as: GLUCOTROL Take 1 tablet (5 mg total) by mouth 2 (two) times daily before a meal. What changed: See the new instructions. Changed by: Fransisca Kaufmann Joyann Spidle, MD   glucose blood test strip Use as instructed to test blood sugar once daily.  Please fill One touch verio test strips. DX:E11.69   lisinopril 5 MG  tablet Commonly known as: ZESTRIL Take 1 tablet (5 mg total) by mouth daily.   metFORMIN 500 MG tablet Commonly known as: GLUCOPHAGE Take 2 tablets (1,000 mg total) by mouth 2 (two) times daily with a meal.   OneTouch Delica Lancets 76P Misc Use to check blood sugar 2 times daily. E11.69   OneTouch Verio w/Device Kit Use to test blood sugar daily   tamsulosin 0.4 MG Caps capsule Commonly known as: FLOMAX Take 1 capsule (0.4 mg total) by mouth daily.        Objective:   BP 129/84    Pulse 71   Temp (!) 97 F (36.1 C)   Ht '5\' 4"'  (1.626 m)   Wt 149 lb (67.6 kg)   BMI 25.58 kg/m   Wt Readings from Last 3 Encounters:  04/30/20 149 lb (67.6 kg)  01/30/20 151 lb 12.8 oz (68.9 kg)  01/26/20 145 lb (65.8 kg)    Physical Exam Vitals and nursing note reviewed.  Constitutional:      General: He is not in acute distress.    Appearance: He is well-developed. He is not diaphoretic.  Eyes:     General: No scleral icterus.    Conjunctiva/sclera: Conjunctivae normal.  Neck:     Thyroid: No thyromegaly.  Cardiovascular:     Rate and Rhythm: Normal rate and regular rhythm.     Heart sounds: Normal heart sounds. No murmur heard.   Pulmonary:     Effort: Pulmonary effort is normal. No respiratory distress.     Breath sounds: Normal breath sounds. No wheezing.  Musculoskeletal:        General: Normal range of motion.     Cervical back: Neck supple.  Lymphadenopathy:     Cervical: No cervical adenopathy.  Skin:    General: Skin is warm and dry.     Findings: No rash.  Neurological:     Mental Status: He is alert and oriented to person, place, and time.     Coordination: Coordination normal.  Psychiatric:        Behavior: Behavior normal.       Assessment & Plan:   Problem List Items Addressed This Visit      Cardiovascular and Mediastinum   Hypertension associated with diabetes ([place])   Relevant Medications   atorvastatin (LIPITOR) 40 MG tablet   empagliflozin (JARDIANCE) 25 MG TABS tablet   glipiZIDE (GLUCOTROL) 5 MG tablet   lisinopril (ZESTRIL) 5 MG tablet   metFORMIN (GLUCOPHAGE) 500 MG tablet   Other Relevant Orders   CBC with Differential/Platelet     Endocrine   Type 2 diabetes mellitus with other specified complication ([facility]) - Primary   Relevant Medications   atorvastatin (LIPITOR) 40 MG tablet   empagliflozin (JARDIANCE) 25 MG TABS tablet   glipiZIDE (GLUCOTROL) 5 MG tablet   lisinopril (ZESTRIL) 5 MG tablet   metFORMIN (GLUCOPHAGE) 500 MG  tablet   Other Relevant Orders   Bayer DCA Hb A1c Waived   CMP14+EGFR   Hyperlipidemia associated with type 2 diabetes mellitus ([facility])   Relevant Medications   atorvastatin (LIPITOR) 40 MG tablet   empagliflozin (JARDIANCE) 25 MG TABS tablet   glipiZIDE (GLUCOTROL) 5 MG tablet   lisinopril (ZESTRIL) 5 MG tablet   metFORMIN (GLUCOPHAGE) 500 MG tablet   Other Relevant Orders   Lipid panel    Other Visit Diagnoses    Need for immunization against influenza       Relevant Orders   Flu Vaccine QUAD  High Dose(Fluad) (Completed)      A1c is up slightly at 8.0, restart his glipizide, gave refills of all of his other medicines Follow up plan: Return in about 3 months (around 07/30/2020), or if symptoms worsen or fail to improve, for Diabetes and hypertension and cholesterol.  Counseling provided for all of the vaccine components Orders Placed This Encounter  Procedures  . Flu Vaccine QUAD High Dose(Fluad)  . Bayer [facility] Hb A1c Waived    Caryl Pina, MD [facility] 04/30/2020, 9:10 AM

## 2020-05-01 DIAGNOSIS — Z23 Encounter for immunization: Secondary | ICD-10-CM | POA: Diagnosis not present

## 2020-05-01 LAB — CMP14+EGFR
ALT: 26 IU/L (ref 0–44)
AST: 17 IU/L (ref 0–40)
Albumin/Globulin Ratio: 2.1 (ref 1.2–2.2)
Albumin: 4.8 g/dL (ref 3.8–4.8)
Alkaline Phosphatase: 61 IU/L (ref 44–121)
BUN/Creatinine Ratio: 14 (ref 10–24)
BUN: 11 mg/dL (ref 8–27)
Bilirubin Total: 0.3 mg/dL (ref 0.0–1.2)
CO2: 24 mmol/L (ref 20–29)
Calcium: 9.2 mg/dL (ref 8.6–10.2)
Chloride: 98 mmol/L (ref 96–106)
Creatinine, Ser: 0.77 mg/dL (ref 0.76–1.27)
GFR calc Af Amer: 109 mL/min/{1.73_m2} (ref >59)
GFR calc non Af Amer: 94 mL/min/{1.73_m2} (ref >59)
Globulin, Total: 2.3 g/dL (ref 1.5–4.5)
Glucose: 174 mg/dL — ABNORMAL HIGH (ref 65–99)
Potassium: 4.9 mmol/L (ref 3.5–5.2)
Sodium: 138 mmol/L (ref 134–144)
Total Protein: 7.1 g/dL (ref 6.0–8.5)

## 2020-05-01 LAB — CBC WITH DIFFERENTIAL/PLATELET
Basophils Absolute: 0.1 x10E3/uL (ref 0.0–0.2)
Basos: 1 %
EOS (ABSOLUTE): 0.3 x10E3/uL (ref 0.0–0.4)
Eos: 4 %
Hematocrit: 49.4 % (ref 37.5–51.0)
Hemoglobin: 16.8 g/dL (ref 13.0–17.7)
Immature Grans (Abs): 0.1 x10E3/uL (ref 0.0–0.1)
Immature Granulocytes: 1 %
Lymphocytes Absolute: 2.2 x10E3/uL (ref 0.7–3.1)
Lymphs: 35 %
MCH: 29 pg (ref 26.6–33.0)
MCHC: 34 g/dL (ref 31.5–35.7)
MCV: 85 fL (ref 79–97)
Monocytes Absolute: 0.4 x10E3/uL (ref 0.1–0.9)
Monocytes: 6 %
Neutrophils Absolute: 3.2 x10E3/uL (ref 1.4–7.0)
Neutrophils: 53 %
Platelets: 210 x10E3/uL (ref 150–450)
RBC: 5.8 x10E6/uL (ref 4.14–5.80)
RDW: 15.4 % (ref 11.6–15.4)
WBC: 6.2 x10E3/uL (ref 3.4–10.8)

## 2020-05-01 LAB — LIPID PANEL
Chol/HDL Ratio: 4.8 ratio (ref 0.0–5.0)
Cholesterol, Total: 190 mg/dL (ref 100–199)
HDL: 40 mg/dL
LDL Chol Calc (NIH): 104 mg/dL — ABNORMAL HIGH (ref 0–99)
Triglycerides: 272 mg/dL — ABNORMAL HIGH (ref 0–149)
VLDL Cholesterol Cal: 46 mg/dL — ABNORMAL HIGH (ref 5–40)

## 2020-05-11 ENCOUNTER — Encounter: Payer: Self-pay | Admitting: *Deleted

## 2020-06-26 ENCOUNTER — Other Ambulatory Visit (HOSPITAL_COMMUNITY): Payer: Self-pay | Admitting: Urology

## 2020-06-26 DIAGNOSIS — R9721 Rising PSA following treatment for malignant neoplasm of prostate: Secondary | ICD-10-CM

## 2020-07-09 ENCOUNTER — Ambulatory Visit: Payer: Medicare Other | Admitting: Neurology

## 2020-08-01 ENCOUNTER — Other Ambulatory Visit: Payer: Self-pay

## 2020-08-01 ENCOUNTER — Ambulatory Visit (INDEPENDENT_AMBULATORY_CARE_PROVIDER_SITE_OTHER): Payer: Medicare Other | Admitting: Family Medicine

## 2020-08-01 ENCOUNTER — Encounter: Payer: Self-pay | Admitting: Family Medicine

## 2020-08-01 VITALS — BP 117/76 | HR 68 | Ht 64.0 in | Wt 145.0 lb

## 2020-08-01 DIAGNOSIS — E785 Hyperlipidemia, unspecified: Secondary | ICD-10-CM

## 2020-08-01 DIAGNOSIS — E1159 Type 2 diabetes mellitus with other circulatory complications: Secondary | ICD-10-CM

## 2020-08-01 DIAGNOSIS — E1169 Type 2 diabetes mellitus with other specified complication: Secondary | ICD-10-CM

## 2020-08-01 DIAGNOSIS — M4726 Other spondylosis with radiculopathy, lumbar region: Secondary | ICD-10-CM | POA: Diagnosis not present

## 2020-08-01 DIAGNOSIS — I152 Hypertension secondary to endocrine disorders: Secondary | ICD-10-CM | POA: Diagnosis not present

## 2020-08-01 DIAGNOSIS — G4700 Insomnia, unspecified: Secondary | ICD-10-CM

## 2020-08-01 LAB — BAYER DCA HB A1C WAIVED: HB A1C (BAYER DCA - WAIVED): 6.7 % (ref <7.0)

## 2020-08-01 MED ORDER — ONETOUCH VERIO W/DEVICE KIT: 1.0000 | PACK | Freq: Two times a day (BID) | 1 refills | Status: AC

## 2020-08-01 MED ORDER — METHYLPREDNISOLONE ACETATE 80 MG/ML IJ SUSP
80.0000 mg | Freq: Once | INTRAMUSCULAR | Status: AC
  Administered 2020-08-01: 80 mg via INTRAMUSCULAR

## 2020-08-01 MED ORDER — AMITRIPTYLINE HCL 25 MG PO TABS: 25.0000 mg | ORAL_TABLET | Freq: Every day | ORAL | 3 refills | Status: DC

## 2020-08-01 NOTE — Progress Notes (Signed)
BP 117/76   Pulse 68   Ht _0  (1.626 m)   Wt 145 lb (65.8 kg)   SpO2 98%   BMI 24.89 kg/m    Subjective:   Patient ID: Chase [name], male    DOB: [birth year], 70 y.o.   MRN: [id number]  HPI: Chase [name] is a 70 y.o. male presenting on 08/01/2020 for Medical Management of Chronic Issues, Diabetes, and Hyperlipidemia   HPI Type 2 diabetes mellitus Patient comes in today for recheck of his diabetes. Patient has been currently taking Jardiance and glipizide and Metformin. Patient is currently on an ACE inhibitor/ARB. Patient has not seen an ophthalmologist this year. Patient denies any issues with their feet. The symptom started onset as an adult hypertension hyperlipidemia ARE RELATED TO DM   Hypertension Patient is currently on lisinopril, and their blood pressure today is 117/76. Patient denies any lightheadedness or dizziness. Patient denies headaches, blurred vision, chest pains, shortness of breath, or weakness. Denies any side effects from medication and is content with current medication.   Hyperlipidemia Patient is coming in for recheck of his hyperlipidemia. The patient is currently taking atorvastatin. They deny any issues with myalgias or history of liver damage from it. They deny any focal numbness or weakness or chest pain.   Patient is still getting occasional headaches and insomnia that come, he thinks is more the insomnia and that it keeps him from sleeping but then causes him to have headaches and he can start processing and thinking at night.  The headaches do not come except for at night when he is not sleeping.  Patient does have a little bit of sciatic pain on the left side but he has had previously little bit of problems with his knees and would like an injection to help with.  Relevant past medical, surgical, family and social history reviewed and updated as indicated. Interim medical history since our last visit reviewed. Allergies and medications  reviewed and updated.  Review of Systems  Constitutional: Negative for chills and fever.  Eyes: Negative for visual disturbance.  Respiratory: Negative for shortness of breath and wheezing.   Cardiovascular: Negative for chest pain and leg swelling.  Musculoskeletal: Negative for back pain and gait problem.  Skin: Negative for rash.  Neurological: Positive for headaches. Negative for dizziness, weakness and light-headedness.  Psychiatric/Behavioral: Positive for sleep disturbance.  All other systems reviewed and are negative.   Per HPI unless specifically indicated above   Allergies as of 08/01/2020   No Known Allergies     Medication List       Accurate as of August 01, 2020  8:28 AM. If you have any questions, ask your nurse or doctor.        atorvastatin 40 MG tablet Commonly known as: LIPITOR Take 1 tablet (40 mg total) by mouth every evening.   empagliflozin 25 MG Tabs tablet Commonly known as: Jardiance Take 1 tablet (25 mg total) by mouth daily before breakfast.   glipiZIDE 5 MG tablet Commonly known as: GLUCOTROL Take 1 tablet (5 mg total) by mouth 2 (two) times daily before a meal.   glucose blood test strip Use as instructed to test blood sugar once daily.  Please fill One touch verio test strips. DX:E11.69   lisinopril 5 MG tablet Commonly known as: ZESTRIL Take 1 tablet (5 mg total) by mouth daily.   metFORMIN 500 MG tablet Commonly known as: GLUCOPHAGE Take 2 tablets (1,000 mg total) by mouth 2 (two) times  daily with a meal.   OneTouch Delica Lancets 68T Misc Use to check blood sugar 2 times daily. E11.69   OneTouch Verio w/Device Kit Use to test blood sugar daily   tamsulosin 0.4 MG Caps capsule Commonly known as: FLOMAX Take 1 capsule (0.4 mg total) by mouth daily.        Objective:   BP 117/76   Pulse 68   Ht _0  (1.626 m)   Wt 145 lb (65.8 kg)   SpO2 98%   BMI 24.89 kg/m   Wt Readings from Last 3 Encounters:  08/01/20 145 lb  (65.8 kg)  04/30/20 149 lb (67.6 kg)  01/30/20 151 lb 12.8 oz (68.9 kg)    Physical Exam Vitals and nursing note reviewed.  Constitutional:      General: He is not in acute distress.    Appearance: He is well-developed and well-nourished. He is not diaphoretic.  Eyes:     General: No scleral icterus.    Extraocular Movements: EOM normal.     Conjunctiva/sclera: Conjunctivae normal.  Neck:     Thyroid: No thyromegaly.  Cardiovascular:     Rate and Rhythm: Normal rate and regular rhythm.     Pulses: Intact distal pulses.     Heart sounds: Normal heart sounds. No murmur heard.   Pulmonary:     Effort: Pulmonary effort is normal. No respiratory distress.     Breath sounds: Normal breath sounds. No wheezing.  Musculoskeletal:        General: No edema. Normal range of motion.     Cervical back: Neck supple.  Lymphadenopathy:     Cervical: No cervical adenopathy.  Skin:    General: Skin is warm and dry.     Findings: No rash.  Neurological:     Mental Status: He is alert and oriented to person, place, and time.     Coordination: Coordination normal.  Psychiatric:        Mood and Affect: Mood and affect normal.        Behavior: Behavior normal.       Assessment & Plan:   Problem List Items Addressed This Visit      Cardiovascular and Mediastinum   Hypertension associated with diabetes ([place])   Relevant Orders   Lipid panel   Bayer DCA Hb A1c Waived   CBC with Differential/Platelet   CMP14+EGFR     Endocrine   Type 2 diabetes mellitus with other specified complication (HCC) - Primary   Relevant Medications   Blood Glucose Monitoring Suppl (ONETOUCH VERIO) w/Device KIT   Other Relevant Orders   Lipid panel   Bayer DCA Hb A1c Waived   CBC with Differential/Platelet   CMP14+EGFR   Hyperlipidemia associated with type 2 diabetes mellitus (HCC)   Relevant Orders   Lipid panel   Bayer DCA Hb A1c Waived   CBC with Differential/Platelet   CMP14+EGFR     Nervous and  Auditory   Osteoarthritis of spine with radiculopathy, lumbar region   Relevant Medications   amitriptyline (ELAVIL) 25 MG tablet   methylPREDNISolone acetate (DEPO-MEDROL) injection 80 mg    Other Visit Diagnoses    Insomnia, unspecified type       Relevant Medications   amitriptyline (ELAVIL) 25 MG tablet      Will try amitriptyline to help with insomnia and headaches.  Depo-Medrol for sciatic type pain on the left side. Follow up plan: Return in about 3 months (around 11/01/2020), or if symptoms worsen or fail  to improve, for diabetes.  Counseling provided for all of the vaccine components Orders Placed This Encounter  Procedures  . Lipid panel  . Bayer DCA Hb A1c Waived  . CBC with Differential/Platelet  . [place] Denyse Fillion, MD [facility] 08/01/2020, 8:28 AM

## 2020-08-02 LAB — CBC WITH DIFFERENTIAL/PLATELET
Basophils Absolute: 0.1 10*3/uL (ref 0.0–0.2)
Basos: 1 %
EOS (ABSOLUTE): 0.1 10*3/uL (ref 0.0–0.4)
Eos: 2 %
Hematocrit: 50.9 % (ref 37.5–51.0)
Hemoglobin: 16.7 g/dL (ref 13.0–17.7)
Immature Grans (Abs): 0.1 10*3/uL (ref 0.0–0.1)
Immature Granulocytes: 2 %
Lymphocytes Absolute: 1.4 10*3/uL (ref 0.7–3.1)
Lymphs: 18 %
MCH: 29 pg (ref 26.6–33.0)
MCHC: 32.8 g/dL (ref 31.5–35.7)
MCV: 88 fL (ref 79–97)
Monocytes Absolute: 0.4 10*3/uL (ref 0.1–0.9)
Monocytes: 6 %
Neutrophils Absolute: 5.4 10*3/uL (ref 1.4–7.0)
Neutrophils: 71 %
Platelets: 293 10*3/uL (ref 150–450)
RBC: 5.76 x10E6/uL (ref 4.14–5.80)
RDW: 14.4 % (ref 11.6–15.4)
WBC: 7.5 10*3/uL (ref 3.4–10.8)

## 2020-08-02 LAB — LIPID PANEL
Chol/HDL Ratio: 6.1 ratio — ABNORMAL HIGH (ref 0.0–5.0)
Cholesterol, Total: 237 mg/dL — ABNORMAL HIGH (ref 100–199)
HDL: 39 mg/dL — ABNORMAL LOW (ref >39)
LDL Chol Calc (NIH): 161 mg/dL — ABNORMAL HIGH (ref 0–99)
Triglycerides: 198 mg/dL — ABNORMAL HIGH (ref 0–149)
VLDL Cholesterol Cal: 37 mg/dL (ref 5–40)

## 2020-08-02 LAB — CMP14+EGFR
ALT: 20 IU/L (ref 0–44)
AST: 12 IU/L (ref 0–40)
Albumin/Globulin Ratio: 1.6 (ref 1.2–2.2)
Albumin: 4.3 g/dL (ref 3.8–4.8)
Alkaline Phosphatase: 71 IU/L (ref 44–121)
BUN/Creatinine Ratio: 16 (ref 10–24)
BUN: 10 mg/dL (ref 8–27)
Bilirubin Total: <0.2 mg/dL (ref 0.0–1.2)
CO2: 24 mmol/L (ref 20–29)
Calcium: 9.7 mg/dL (ref 8.6–10.2)
Chloride: 101 mmol/L (ref 96–106)
Creatinine, Ser: 0.62 mg/dL — ABNORMAL LOW (ref 0.76–1.27)
Globulin, Total: 2.7 g/dL (ref 1.5–4.5)
Glucose: 79 mg/dL (ref 65–99)
Potassium: 4.7 mmol/L (ref 3.5–5.2)
Sodium: 139 mmol/L (ref 134–144)
Total Protein: 7 g/dL (ref 6.0–8.5)
eGFR: 105 mL/min/{1.73_m2} (ref >59)

## 2020-08-03 ENCOUNTER — Other Ambulatory Visit: Payer: Self-pay

## 2020-08-03 ENCOUNTER — Ambulatory Visit (HOSPITAL_COMMUNITY)
Admission: RE | Admit: 2020-08-03 | Discharge: 2020-08-03 | Disposition: A | Discharge status: Home / Self Care | Payer: Medicare Other | Source: Ambulatory Visit | Attending: Urology | Admitting: Urology

## 2020-08-03 DIAGNOSIS — R9721 Rising PSA following treatment for malignant neoplasm of prostate: Secondary | ICD-10-CM | POA: Insufficient documentation

## 2020-08-03 DIAGNOSIS — C61 Malignant neoplasm of prostate: Secondary | ICD-10-CM | POA: Diagnosis not present

## 2020-08-03 MED ORDER — PIFLIFOLASTAT F 18 (PYLARIFY) INJECTION
9.0000 | Freq: Once | INTRAVENOUS | Status: AC
  Administered 2020-08-03: 8.1 via INTRAVENOUS

## 2020-08-08 ENCOUNTER — Encounter: Payer: Self-pay | Admitting: *Deleted

## 2020-08-16 ENCOUNTER — Encounter: Payer: Self-pay | Admitting: Neurology

## 2020-08-16 DIAGNOSIS — R9721 Rising PSA following treatment for malignant neoplasm of prostate: Secondary | ICD-10-CM | POA: Diagnosis not present

## 2020-08-16 DIAGNOSIS — C778 Secondary and unspecified malignant neoplasm of lymph nodes of multiple regions: Secondary | ICD-10-CM | POA: Diagnosis not present

## 2020-08-16 DIAGNOSIS — C61 Malignant neoplasm of prostate: Secondary | ICD-10-CM | POA: Diagnosis not present

## 2020-08-27 ENCOUNTER — Ambulatory Visit (INDEPENDENT_AMBULATORY_CARE_PROVIDER_SITE_OTHER): Payer: Medicare Other

## 2020-08-27 ENCOUNTER — Telehealth: Payer: Self-pay

## 2020-08-27 DIAGNOSIS — Z0000 Encounter for general adult medical examination without abnormal findings: Secondary | ICD-10-CM | POA: Diagnosis not present

## 2020-08-27 NOTE — Progress Notes (Signed)
MEDICARE ANNUAL WELLNESS VISIT  08/27/2020  Telephone Visit Disclaimer This Medicare AWV was conducted by telephone due to national recommendations for restrictions regarding the COVID-19 Pandemic (e.g. social distancing).  I verified, using two identifiers, that I am speaking with Chase [name] or their authorized healthcare agent. I discussed the limitations, risks, security, and privacy concerns of performing an evaluation and management service by telephone and the potential availability of an in-person appointment in the future. The patient expressed understanding and agreed to proceed.  Location of Patient: Home Location of Provider (nurse):  [facility]  Subjective:    Chase [name] is a 70 y.o. male patient of Dettinger, Fransisca Kaufmann, MD who had a Medicare Annual Wellness Visit today via telephone. Chase [name] is Unemployed and lives with their spouse. he has eleven children. he reports that he is socially active and does interact with friends/family regularly. he is minimally physically active and enjoys watching television.  Patient Care Team: Dettinger, Fransisca Kaufmann, MD as PCP - General (Family Medicine) Ashok Pall, MD as Consulting Physician (Neurosurgery) Danie Binder, MD (Inactive) as Consulting Physician (Gastroenterology) Ilean China, RN as Registered Nurse  Advanced Directives 08/27/2020 01/30/2020 09/20/2019 03/22/2019 12/22/2018 12/20/2018 03/02/2018  Does Patient Have a Medical Advance Directive? _0  No No  Type of Advance Directive - - - - - - -  Does patient want to make changes to medical advance directive? - - - - - - -  Copy of [place] in Chart? - - - - - - -  Would patient like information on creating a medical advance directive? No - Patient declined - - - No - Patient declined No - Patient declined -    Hospital Utilization Over the Past 12 Months: # of hospitalizations or ER visits: 0 # of  surgeries: 0  Review of Systems    Patient reports that his overall health is unchanged compared to last year.  Negative except Complains of headache and not sleeping well. Amitriptyline not helping.  Patient Reported Readings (BP, Pulse, CBG, Weight, etc) CBG-118  Pain Assessment Pain : No/denies pain     Current Medications & Allergies (verified) Allergies as of 08/27/2020   No Known Allergies     Medication List       Accurate as of August 27, 2020 10:45 AM. If you have any questions, ask your nurse or doctor.        amitriptyline 25 MG tablet Commonly known as: ELAVIL Take 1 tablet (25 mg total) by mouth at bedtime.   atorvastatin 40 MG tablet Commonly known as: LIPITOR Take 1 tablet (40 mg total) by mouth every evening.   empagliflozin 25 MG Tabs tablet Commonly known as: Jardiance Take 1 tablet (25 mg total) by mouth daily before breakfast.   glipiZIDE 5 MG tablet Commonly known as: GLUCOTROL Take 1 tablet (5 mg total) by mouth 2 (two) times daily before a meal.   glucose blood test strip Use as instructed to test blood sugar once daily.  Please fill One touch verio test strips. DX:E11.69   lisinopril 5 MG tablet Commonly known as: ZESTRIL Take 1 tablet (5 mg total) by mouth daily.   metFORMIN 500 MG tablet Commonly known as: GLUCOPHAGE Take 2 tablets (1,000 mg total) by mouth 2 (two) times daily with a meal.   OneTouch Delica Lancets 16X Misc Use to check blood sugar 2 times daily. E11.69   OneTouch Verio w/Device Kit  Take 1 each by mouth in the morning and at bedtime. Use to test blood sugar daily   tamsulosin 0.4 MG Caps capsule Commonly known as: FLOMAX Take 1 capsule (0.4 mg total) by mouth daily.       History (reviewed): Past Medical History:  Diagnosis Date  . Arthritis   . Cancer [facility])    prostate 2017  . Depression   . Diabetes mellitus without complication ([place])   . Headache   . High cholesterol   . Hypertension   . Inguinal  hernia    rt  . Neck pain   . Numbness and tingling in hands   . Urinary frequency    Past Surgical History:  Procedure Laterality Date  . ANTERIOR CERVICAL DECOMP/DISCECTOMY FUSION N/A 04/04/2015   Procedure: ANTERIOR CERVICAL DECOMPRESSION/DISCECTOMY FUSION PLATING BONEGRAFT CERVICAL FOUR-FIVE CERVICAL FIVE-SIX;  Surgeon: Ashok Pall, MD;  Location: [facility];  Service: Neurosurgery;  Laterality: N/A;  . BACK SURGERY     lower back with Dr. Christella Noa  . COLONOSCOPY WITH PROPOFOL N/A 03/22/2019   Procedure: COLONOSCOPY WITH PROPOFOL;  Surgeon: Danie Binder, MD;  Location: [facility];  Service: Endoscopy;  Laterality: N/A;  8:30am  . INGUINAL HERNIA REPAIR Right 12/22/2018   Procedure: HERNIA REPAIR INGUINAL ADULT WITH MESH;  Surgeon: Aviva Signs, MD;  Location: [facility];  Service: General;  Laterality: Right;  . POLYPECTOMY  03/22/2019   Procedure: POLYPECTOMY;  Surgeon: Danie Binder, MD;  Location: [facility];  Service: Endoscopy;;  Cold snare polypectomy cecal, hepatic flexure polyps x 2,descending colon  polyps x 3,  and rectal polyps x 3   . SHOULDER ARTHROSCOPY Right   . UPPER EXTREMITY VENOGRAPHY N/A 07/22/2016   Procedure: Upper Extremity Venography - bilateral arm;  Surgeon: Serafina Mitchell, MD;  Location: [facility] CV LAB;  Service: Cardiovascular;  Laterality: N/A;   Family History  Problem Relation Age of Onset  . Colon cancer Neg Hx   . Colon polyps Neg Hx    Social History   Socioeconomic History  . Marital status: Married    Spouse name: Not on file  . Number of children: 11  . Years of education: Not on file  . Highest education level: Not on file  Occupational History  . Not on file  Tobacco Use  . Smoking status: Former Smoker    Packs/day: 0.25    Years: 40.00    Pack years: 10.00    Types: Cigarettes    Quit date: 12/19/2017    Years since quitting: 2.6  . Smokeless tobacco: Never Used  . Tobacco comment: quit 11/2017 (as of 12/08/2018)   Vaping Use  . Vaping Use: Never used  Substance and Sexual Activity  . Alcohol use: Not Currently    Comment: no ETOH since since 2016 (as of 12/08/2018)  . Drug use: No  . Sexual activity: Not Currently  Other Topics Concern  . Not on file  Social History Narrative   Right handed   livs with wife in a one story   Social Determinants of Health   Financial Resource Strain: Low Risk   . Difficulty of Paying Living Expenses: Not very hard  Food Insecurity: No Food Insecurity  . Worried About Charity fundraiser in the Last Year: Never true  . Ran Out of Food in the Last Year: Never true  Transportation Needs: No Transportation Needs  . Lack of Transportation (Medical): No  . Lack of Transportation (  Non-Medical): No  Physical Activity: Insufficiently Active  . Days of Exercise per Week: 7 days  . Minutes of Exercise per Session: 20 min  Stress: Stress Concern Present  . Feeling of Stress : Rather much  Social Connections: Socially Integrated  . Frequency of Communication with Friends and Family: More than three times a week  . Frequency of Social Gatherings with Friends and Family: More than three times a week  . Attends Religious Services: More than 4 times per year  . Active Member of Clubs or Organizations: Yes  . Attends Archivist Meetings: More than 4 times per year  . Marital Status: Married    Activities of Daily Living In your present state of health, do you have any difficulty performing the following activities: 08/27/2020 10/17/2019  Hearing? N -  Vision? N -  Comment - -  Difficulty concentrating or making decisions? N -  Comment - -  Walking or climbing stairs? N Y  Comment - has to hold on to something or crawl upstairs  Dressing or bathing? N -  Doing errands, shopping? [place] and eating ? N -  Using the Toilet? N -  In the past six months, have you accidently leaked urine? N -  Do you have problems with loss of bowel  control? N -  Managing your Medications? N -  Managing your Finances? N -  Housekeeping or managing your Housekeeping? N -  Some recent data might be hidden    Patient Education/ Literacy How often do you need to have someone help you when you read instructions, pamphlets, or other written materials from your doctor or pharmacy?: 4 - Often What is the last grade level you completed in school?: 1st grade  Exercise Current Exercise Habits: The patient does not participate in regular exercise at present  Diet Patient reports consuming 2 meals a day and 2 snack(s) a day Patient reports that his primary diet is: Regular Patient reports that she does have regular access to food.   Depression Screen PHQ 2/9 Scores 08/27/2020 08/27/2020 08/01/2020 04/30/2020 10/25/2019 11/16/2018 08/12/2018  PHQ - 2 Score 1 0 0 6 6 0 0  PHQ- 9 Score - - - 18 18 - -     Fall Risk Fall Risk  08/27/2020 08/01/2020 04/30/2020 01/30/2020 01/26/2020  Falls in the past year? 0 0 0 0 0  Number falls in past yr: - - - 0 -  Injury with Fall? - - - 0 -  Risk for fall due to : - - - - -     Objective:  Chase [name] seemed alert and oriented and he participated appropriately during our telephone visit.  Blood Pressure Weight BMI  BP Readings from Last 3 Encounters:  08/01/20 117/76  04/30/20 129/84  01/30/20 123/79   Wt Readings from Last 3 Encounters:  08/01/20 145 lb (65.8 kg)  04/30/20 149 lb (67.6 kg)  01/30/20 151 lb 12.8 oz (68.9 kg)   BMI Readings from Last 1 Encounters:  08/01/20 24.89 kg/m    *Unable to obtain current vital signs, weight, and BMI due to telephone visit type  Hearing/Vision  . Chase [name] did not seem to have difficulty with hearing/understanding during the telephone conversation . Reports that he has not had a formal eye exam by an eye care professional within the past year . Reports that he has not had a formal hearing evaluation within the past year *Unable to  fully assess hearing and  vision during telephone visit type  Cognitive Function: No flowsheet data found. (Normal:0-7, Significant for Dysfunction: >8)  Normal Cognitive Function Screening: No: Unable to complete   Immunization & Health Maintenance Record Immunization History  Administered Date(s) Administered  . Fluad Quad(high Dose 65+) 04/30/2020  . Influenza-Unspecified 06/02/2018  . Moderna Sars-Covid-2 Vaccination 07/27/2019, 08/24/2019  . Pneumococcal Conjugate-13 03/29/2018  . Pneumococcal Polysaccharide-23 10/25/2019  . Tdap 03/29/2018    Health Maintenance  Topic Date Due  . COVID-19 Vaccine (3 - Moderna risk 4-dose series) 11/01/2020 (Originally 09/21/2019)  . Hepatitis C Screening  04/30/2021 (Originally May 22, 1951)  . OPHTHALMOLOGY EXAM  10/06/2020  . FOOT EXAM  10/24/2020  . HEMOGLOBIN A1C  02/01/2021  . TETANUS/TDAP  03/29/2028  . COLONOSCOPY (Pts 45-14yr Insurance coverage will need to be confirmed)  03/21/2029  . INFLUENZA VACCINE  Completed  . PNA vac Low Risk Adult  Completed  . HPV VACCINES  Aged Out       Assessment  This is a routine wellness examination for Chase [name].  Health Maintenance: Due or Overdue There are no preventive care reminders to display for this patient.  Chase [name] does not need a referral for CCommercial Metals CompanyAssistance: Care Management:   no Social Work:    no Prescription Assistance:  no Nutrition/Diabetes Education:  no   Plan:  Personalized Goals Goals Addressed   None    Personalized Health Maintenance & Screening Recommendations    Lung Cancer Screening Recommended: no (Low Dose CT Chest recommended if Age 68-80years, 30 pack-year currently smoking OR have quit w/in past 15 years) Hepatitis C Screening recommended: no HIV Screening recommended: no  Advanced Directives: Written information was not prepared per patient's request.  Referrals & Orders No orders of the defined types were placed in this encounter.   Follow-up  Plan . Follow-up with Dettinger, Chase Kaufmann MD as planned . Schedule 11/08/20    I have personally reviewed and noted the following in the patient's chart:   . Medical and social history . Use of alcohol, tobacco or illicit drugs  . Current medications and supplements . Functional ability and status . Nutritional status . Physical activity . Advanced directives . List of other physicians . Hospitalizations, surgeries, and ER visits in previous 12 months . Vitals . Screenings to include cognitive, depression, and falls . Referrals and appointments  In addition, I have reviewed and discussed with JElita QuickRuiz-Guerrero certain preventive protocols, quality metrics, and best practice recommendations. A written personalized care plan for preventive services as well as general preventive health recommendations is available and can be mailed to the patient at his request.      AMaud DeedHMilwaukee Cty Behavioral Hlth Div 30/98/1191

## 2020-08-27 NOTE — Telephone Encounter (Signed)
Pt was prescribed amitriptyline at 08/01/20 visit with Dettinger. He states that the medication is not helping with his sleep or headaches.

## 2020-08-27 NOTE — Patient Instructions (Addendum)
  [place] Maintenance Summary and Written Plan of Care  Chase [name] ,  Thank you for allowing me to perform your Medicare Annual Wellness Visit and for your ongoing commitment to your health.   Health Maintenance & Immunization History Health Maintenance  Topic Date Due  . COVID-19 Vaccine (3 - Moderna risk 4-dose series) 11/01/2020 (Originally 09/21/2019)  . Hepatitis C Screening  04/30/2021 (Originally 12-Jan-1951)  . OPHTHALMOLOGY EXAM  10/06/2020  . FOOT EXAM  10/24/2020  . HEMOGLOBIN A1C  02/01/2021  . TETANUS/TDAP  03/29/2028  . COLONOSCOPY (Pts 45-62yrs Insurance coverage will need to be confirmed)  03/21/2029  . INFLUENZA VACCINE  Completed  . PNA vac Low Risk Adult  Completed  . HPV VACCINES  Aged Out   Immunization History  Administered Date(s) Administered  . Fluad Quad(high Dose 65+) 04/30/2020  . Influenza-Unspecified 06/02/2018  . Moderna Sars-Covid-2 Vaccination 07/27/2019, 08/24/2019  . Pneumococcal Conjugate-13 03/29/2018  . Pneumococcal Polysaccharide-23 10/25/2019  . Tdap 03/29/2018     This is a list of Health Maintenance Items that are overdue or due now: There are no preventive care reminders to display for this patient.   Orders/Referrals Placed Today: No orders of the defined types were placed in this encounter.  (Contact our referral department at [phone] if you have not spoken with someone about your referral appointment within the next 5 days)    Follow-up Plan  Scheduled with Dettinger 11/08/20 at 8:10am.

## 2020-08-28 NOTE — Telephone Encounter (Signed)
Called patient with interpreter line and [facility] not set up ac 03/29

## 2020-08-28 NOTE — Telephone Encounter (Signed)
Have him increase to 2 tablets every evening and see if helps. Caryl Pina, MD [facility] 08/28/2020, 8:00 AM

## 2020-08-31 NOTE — Telephone Encounter (Signed)
Tried to contact patient via interpreter, patient's voice mail box has not been set up

## 2020-09-12 NOTE — Progress Notes (Signed)
GU Location of Tumor / Histology: Recurrent prostatic adenocarcinoma  If Prostate Cancer, Gleason Score is (4 + 3) and PSA is (8.48) at diagnosis.  Chase [name] completed radiation therapy on 01/21/16. Unfortunately, the patient PSA began to rise. PET scan revealed nodal involvement.  Radiotracer activity associated 2 small lymph nodes is concerning for prostate cancer nodal metastasis. One node in the pelvis along the RIGHT iliac vessels and one node in the para-aortic retroperitoneum LEFT of the aorta.      Past/Anticipated interventions by urology, if any: prostate biopsy, repeat prostate biopsy, PET scan, referral back to radiation oncology  Past/Anticipated interventions by medical oncology, if any: no  Weight changes, if any: Has lost about 8 pounds over the past 3 months.  Bowel/Bladder complaints, if any: Reports nocturia with hesitancy and intermittency worse at night. Patient has a weak urine stream. Reports taking tamsulosin.   Nausea/Vomiting, if any: no  Pain issues, if any:  No  SAFETY ISSUES:  Prior radiation? Yes, completed 01/21/2016  Pacemaker/ICD? No  Possible current pregnancy? no, male patient  Is the patient on methotrexate? No  Current Complaints / other details:

## 2020-09-17 ENCOUNTER — Encounter: Payer: Self-pay | Admitting: Medical Oncology

## 2020-09-17 ENCOUNTER — Ambulatory Visit
Admission: RE | Admit: 2020-09-17 | Discharge: 2020-09-17 | Disposition: A | Discharge status: Home / Self Care | Payer: Medicare Other | Source: Ambulatory Visit | Attending: Radiation Oncology | Admitting: Radiation Oncology

## 2020-09-17 ENCOUNTER — Encounter: Payer: Self-pay | Admitting: Radiation Oncology

## 2020-09-17 ENCOUNTER — Other Ambulatory Visit: Payer: Self-pay

## 2020-09-17 VITALS — Ht 64.0 in | Wt 145.0 lb

## 2020-09-17 DIAGNOSIS — C775 Secondary and unspecified malignant neoplasm of intrapelvic lymph nodes: Secondary | ICD-10-CM | POA: Diagnosis not present

## 2020-09-17 DIAGNOSIS — C61 Malignant neoplasm of prostate: Secondary | ICD-10-CM

## 2020-09-17 NOTE — Progress Notes (Signed)
Radiation Oncology         (336) [phone] ________________________________  Initial Outpatient Consultation - Conducted via telephone due to current COVID-19 concerns for limiting patient exposure  Call Conducted through [employer] since patient is Spanish speaking  Name: Chase [name] MRN: [id number]  Date: 09/17/2020  DOB: 12/28/1950  SN:[id number], Chase Kaufmann, [name]  Chase Hughs, [name]   REFERRING PHYSICIAN: Ardis Hughs, [name]  DIAGNOSIS: 70 y.o. gentleman with oligometastatic adenocarcinoma of the prostate status post previous prostate radiotherapy, now with 1 pelvic and 1 periaortic lymph node    ICD-10-CM   1. Prostate cancer ([place])  Chase [name] is a 70 y.o. gentleman.  He was noted to have an elevated PSA of 8.48.  And he was diagnosed by Dr. Karsten [name] with Gleason 4+3 prostate cancer completing radiation therapy 01/21/2016 in [place].  Unfortunately, the patient experienced a biochemical recurrence with rising PSA.  When his PSA reached 4.21, the patient underwent prostate biopsy on 10/11/2019 revealing no evidence of recurrent disease.  His PSA reached 6.91 on 04/17/2020 prompting PSMA PET scan.  The PET scan performed on 08/03/2020 demonstrated no focal activity within the prostate.  However, there was a small right external iliac lymph node superior to the obturator fossa measuring 3 mm with an SUV max of 4.1 and a second lymph node in the periaortic retroperitoneal left of the aorta measuring 3 mm with an SUV max of 4.6.      The patient has kindly been referred today for consideration of stereotactic body radiotherapy for salvage of oligometastatic prostate cancer and discussion of potential radiation treatment options.Marland Kitchen  PREVIOUS RADIATION THERAPY: Yes prostate IMRT in 2017 in [place], [place], records requested but not yet received  PAST MEDICAL HISTORY:  has a past medical history of Arthritis, Cancer ([place]), Depression, Diabetes  mellitus without complication ([place]), Headache, High cholesterol, Hypertension, Inguinal hernia, Neck pain, Numbness and tingling in hands, and Urinary frequency.    PAST SURGICAL HISTORY: Past Surgical History:  Procedure Laterality Date  . ANTERIOR CERVICAL DECOMP/DISCECTOMY FUSION N/A 04/04/2015   Procedure: ANTERIOR CERVICAL DECOMPRESSION/DISCECTOMY FUSION PLATING BONEGRAFT CERVICAL FOUR-FIVE CERVICAL FIVE-SIX;  Surgeon: Ashok Pall, [name];  Location: [facility];  Service: Neurosurgery;  Laterality: N/A;  . BACK SURGERY     lower back with Dr. Christella Noa  . COLONOSCOPY WITH PROPOFOL N/A 03/22/2019   Procedure: COLONOSCOPY WITH PROPOFOL;  Surgeon: Danie Binder, [name];  [facility];  Service: Endoscopy;  Laterality: N/A;  8:30am  . INGUINAL HERNIA REPAIR Right 12/22/2018   Procedure: HERNIA REPAIR INGUINAL ADULT WITH MESH;  Surgeon: Aviva Signs, [name];  Location: [facility];  Service: General;  Laterality: Right;  . POLYPECTOMY  03/22/2019   Procedure: POLYPECTOMY;  Surgeon: Danie Binder, [name];  [facility];  Service: Endoscopy;;  Cold snare polypectomy cecal, hepatic flexure polyps x 2,descending colon  polyps x 3,  and rectal polyps x 3   . SHOULDER ARTHROSCOPY Right   . UPPER EXTREMITY VENOGRAPHY N/A 07/22/2016   Procedure: Upper Extremity Venography - bilateral arm;  Surgeon: Serafina Mitchell, [name];  Location: [facility];  Service: Cardiovascular;  Laterality: N/A;    FAMILY HISTORY: family history is not on file.  SOCIAL HISTORY:  reports that he quit smoking about 2 years ago. His smoking use included cigarettes. He has a 10.00 pack-year smoking history. He has never used smokeless tobacco. He reports previous alcohol use. He reports  that he does not use drugs.  ALLERGIES: Patient has no known allergies.  MEDICATIONS:  Current Outpatient Medications  Medication Sig Dispense Refill  . amitriptyline (ELAVIL) 25 MG tablet Take 1 tablet (25 mg total) by mouth at  bedtime. 30 tablet 3  . atorvastatin (LIPITOR) 40 MG tablet Take 1 tablet (40 mg total) by mouth every evening. 90 tablet 3  . Blood Glucose Monitoring Suppl (ONETOUCH VERIO) w/Device KIT Take 1 each by mouth in the morning and at bedtime. Use to test blood sugar daily 1 kit 1  . empagliflozin (JARDIANCE) 25 MG TABS tablet Take 1 tablet (25 mg total) by mouth daily before breakfast. 90 tablet 3  . glipiZIDE (GLUCOTROL) 5 MG tablet Take 1 tablet (5 mg total) by mouth 2 (two) times daily before a meal. 180 tablet 3  . glucose blood test strip Use as instructed to test blood sugar once daily.  Please fill One touch verio test strips. DX:E11.69 100 each 12  . lisinopril (ZESTRIL) 5 MG tablet Take 1 tablet (5 mg total) by mouth daily. 90 tablet 3  . metFORMIN (GLUCOPHAGE) 500 MG tablet Take 2 tablets (1,000 mg total) by mouth 2 (two) times daily with a meal. 180 tablet 3  . OneTouch Delica Lancets 32D MISC Use to check blood sugar 2 times daily. E11.69 100 each 4  . tamsulosin (FLOMAX) 0.4 MG CAPS capsule Take 1 capsule (0.4 mg total) by mouth daily. 90 capsule 3   No current facility-administered medications for this encounter.    REVIEW OF SYSTEMS:  A 15 point review of systems is documented in the electronic medical record. This was obtained by the nursing staff. However, I reviewed this with the patient to discuss relevant findings and make appropriate changes.  Pertinent items are noted in HPI..  .   PHYSICAL EXAM: This patient is in no acute distress.  He is alert and oriented.   height is '5\' 4"'  (1.626 m) and weight is 145 lb (65.8 kg).  He exhibits no respiratory distress or labored breathing.  He appears neurologically intact.  His mood is pleasant.  His affect is appropriate.   KPS = 100  100 - Normal; no complaints; no evidence of disease. 90   - Able to carry on normal activity; minor signs or symptoms of disease. 80   - Normal activity with effort; some signs or symptoms of disease. 15    - Cares for self; unable to carry on normal activity or to do active work. 60   - Requires occasional assistance, but is able to care for most of his personal needs. 50   - Requires considerable assistance and frequent medical care. 67   - Disabled; requires special care and assistance. 17   - Severely disabled; hospital admission is indicated although death not imminent. 27   - Very sick; hospital admission necessary; active supportive treatment necessary. 10   - Moribund; fatal processes progressing rapidly. 0     - Dead  Karnofsky DA, Abelmann Mullin, Craver LS and Burchenal [facility] [phone]) The use of the nitrogen mustards in the palliative treatment of carcinoma: with particular reference to bronchogenic carcinoma Cancer 1 634-56   LABORATORY DATA:  Lab Results  Component Value Date   WBC 7.5 08/01/2020   HGB 16.7 08/01/2020   HCT 50.9 08/01/2020   MCV 88 08/01/2020   PLT 293 08/01/2020   Lab Results  Component Value Date   NA 139 08/01/2020   K 4.7 08/01/2020  CL 101 08/01/2020   CO2 24 08/01/2020   Lab Results  Component Value Date   ALT 20 08/01/2020   AST 12 08/01/2020   ALKPHOS 71 08/01/2020   BILITOT <0.2 08/01/2020     RADIOGRAPHY: No results found.    IMPRESSION: This gentleman is a 70 y.o. gentleman with oligometastatic adenocarcinoma of the prostate status post previous prostate radiotherapy, now with 1 pelvic and 1 periaortic lymph node.  At this point, salvage focused radiation to the involved lymph nodes may provide disease control either temporarily or permanently.  PLAN:  This visit was conducted via telephone to spare the patient unnecessary potential exposure in the healthcare setting during the current COVID-19 pandemic  Today, I talked to the patient and family about the findings and work-up thus far.  We discussed the natural history of oligometastatic prostate cancer and general treatment, highlighting the role of stereotactic and ultrahypofractionated  radiotherapy in the management.  We discussed the available radiation techniques, and focused on the details of logistics and delivery.  We reviewed the anticipated acute and late sequelae associated with radiation in this setting.  The patient was encouraged to ask questions that I answered to the best of my ability.    I would recommend a 10-fraction course to treat the two visible nodes to an ablative 50 Gy in 10 fractions while treating the intervening and involved nodal echelons to 30 Gy in 10 fractions.  The patient would like to proceed with radiation and will be scheduled for CT simulation.  I spent 60 minutes minutes reviewing records and films, talking to the patient and following up on coordination of care.    Given current concerns for patient exposure during the COVID-19 pandemic, this encounter was conducted via telephone. The patient was notified in advance and was offered a [place] meeting to allow for face to face communication but unfortunately reported that he/she did not have the appropriate resources/technology to support such a visit and instead preferred to proceed with telephone consult. The patient has given verbal consent for this type of encounter. The time spent during this encounter was 60 minutes. The attendants for this meeting include Chase [name],, patient, Chase [name] and family members his daughter. During the encounter, Chase [name], was located at [facility] Radiation Oncology Department.  Patient, Chase [name] and family, daughter were located at home.    ------------------------------------------------  Sheral Apley. Tammi Klippel, M.D.

## 2020-09-20 ENCOUNTER — Telehealth: Payer: Self-pay | Admitting: Family Medicine

## 2020-09-20 NOTE — Addendum Note (Signed)
Encounter addended by: Heywood Footman, RN on: 09/20/2020 2:32 PM  Actions taken: Charge Capture section accepted

## 2020-09-20 NOTE — Addendum Note (Signed)
Encounter addended by: Heywood Footman, RN on: 09/20/2020 2:31 PM  Actions taken: Charge Capture section accepted

## 2020-09-20 NOTE — Telephone Encounter (Signed)
   Rohin Krejci DOB: 26-Feb-1951 MRN: [id number]   RIDER WAIVER AND RELEASE OF LIABILITY  For purposes of improving physical access to our facilities, [facility] is pleased to partner with third parties to provide [place] patients or other authorized individuals the option of convenient, on-demand ground transportation services (the [place]") through use of the technology service that enables users to request on-demand ground transportation from independent third-party providers.  By opting to use and accept these [employer], I, the undersigned, hereby agree on behalf of myself, and on behalf of any minor child using the [employer] for whom I am the parent or legal guardian, as follows:  1. Government social research officer provided to me are provided by independent third-party transportation providers who are not [employer] or employees and who are unaffiliated with [employer]. 2. [facility] is neither a transportation carrier nor a common or public carrier. 3. [facility] has no control over the quality or safety of the transportation that occurs as a result of the [employer]. 4. [facility] cannot guarantee that any third-party transportation provider will complete any arranged transportation service. 5. [facility] makes no representation, warranty, or guarantee regarding the reliability, timeliness, quality, safety, suitability, or availability of any of the Transport Services or that they will be error free. 6. I fully understand that traveling by vehicle involves risks and dangers of serious bodily injury, including permanent disability, paralysis, and death. I agree, on behalf of myself and on behalf of any minor child using the Transport Services for whom I am the parent or legal guardian, that the entire risk arising out of my use of the [employer] remains solely with me, to the maximum extent permitted under applicable law. 7. The [employer] are provided "as is" and "as available." [facility] disclaims all representations and warranties, express, implied or statutory, not expressly set out in these terms, including the implied warranties of merchantability and fitness for a particular purpose. 8. I hereby waive and release [facility], its agents, employees, officers, directors, representatives, insurers, attorneys, assigns, successors, subsidiaries, and affiliates from any and all past, present, or future claims, demands, liabilities, actions, causes of action, or suits of any kind directly or indirectly arising from acceptance and use of the [employer]. 9. I further waive and release [facility] and its affiliates from all present and future liability and responsibility for any injury or death to persons or damages to property caused by or related to the use of the [employer]. 10. I have read this Waiver and Release of Liability, and I understand the terms used in it and their legal significance. This Waiver is freely and voluntarily given with the understanding that my right (as well as the right of any minor child for whom I am the parent or legal guardian using the [employer]) to legal recourse against [place] in connection with the [employer] is knowingly surrendered in return for use of these services.   I attest that I read the consent document to Cyndee Brightly, gave Mr. Ryder the opportunity to ask questions and answered the questions asked (if any). I affirm that Itamar Ruiz-Guerrero then provided consent for he's participation in this program.     Legrand [place], read to patient's daughter in [place]

## 2020-09-24 ENCOUNTER — Other Ambulatory Visit: Payer: Self-pay

## 2020-09-24 ENCOUNTER — Ambulatory Visit
Admission: RE | Admit: 2020-09-24 | Discharge: 2020-09-24 | Disposition: A | Discharge status: Home / Self Care | Payer: Medicare Other | Source: Ambulatory Visit | Attending: Radiation Oncology | Admitting: Radiation Oncology

## 2020-09-24 ENCOUNTER — Encounter: Payer: Self-pay | Admitting: Medical Oncology

## 2020-09-24 DIAGNOSIS — Z510 Encounter for antineoplastic radiation therapy: Secondary | ICD-10-CM | POA: Diagnosis not present

## 2020-09-24 DIAGNOSIS — C61 Malignant neoplasm of prostate: Secondary | ICD-10-CM | POA: Diagnosis not present

## 2020-09-24 DIAGNOSIS — C775 Secondary and unspecified malignant neoplasm of intrapelvic lymph nodes: Secondary | ICD-10-CM | POA: Diagnosis not present

## 2020-09-24 NOTE — Progress Notes (Signed)
  Radiation Oncology         [phone]) [phone] ________________________________  Name: Chase [name] MRN: [id number]  Date: 09/24/2020  DOB: 1951-04-17  STEREOTACTIC BODY RADIOTHERAPY SIMULATION AND TREATMENT PLANNING NOTE    ICD-10-CM   1. Prostate cancer [facility])  C61     DIAGNOSIS:  70 y.o. gentleman with oligometastatic adenocarcinoma of the prostate status post previous prostate radiotherapy, now with 1 pelvic and 1 peraaortic lymph node  NARRATIVE:  The patient was brought to the [facility].  Identity was confirmed.  All relevant records and images related to the planned course of therapy were reviewed.  The patient freely provided informed written consent to proceed with treatment after reviewing the details related to the planned course of therapy. The consent form was witnessed and verified by the simulation staff.  Then, the patient was set-up in a stable reproducible  supine position for radiation therapy.  A BodyFix immobilization pillow was fabricated for reproducible positioning.  Surface markings were placed.  The CT images were loaded into the planning software.  The gross target volumes (GTV) and planning target volumes (PTV) were delinieated, and avoidance structures were contoured.  Treatment planning then occurred.  The radiation prescription was entered and confirmed.  A total of two complex treatment devices were fabricated in the form of the BodyFix immobilization pillow and a neck accuform cushion.  I have requested : 3D Simulation  I have requested a DVH of the following structures: targets and all normal structures near the target including kidneys, bowel, spinal cord and others as noted on the radiation plan to maintain doses in adherence with established limits  SPECIAL TREATMENT PROCEDURE:  The planned course of therapy using radiation constitutes a special treatment procedure. Special care is required in the management of this patient for the following  reasons. High dose per fraction requiring special monitoring for increased toxicities of treatment including daily imaging..  The special nature of the planned course of radiotherapy will require increased physician supervision and oversight to ensure patient's safety with optimal treatment outcomes.  PLAN:  The patient will receive 50 Gy in 10 fractions to the two involved nodes with 30 Gy in 10 fractions to involved and intervening lymph node echelons.  There were some smaller scattered nodes in intervening nodal echelons with lower PET activity.  These nodes were contoured for inclusion in the 30 Gy zone in the event they harbor more microscopic disease.  ________________________________  Sheral Apley. Tammi Klippel, M.D.

## 2020-10-01 DIAGNOSIS — C61 Malignant neoplasm of prostate: Secondary | ICD-10-CM | POA: Insufficient documentation

## 2020-10-01 DIAGNOSIS — Z510 Encounter for antineoplastic radiation therapy: Secondary | ICD-10-CM | POA: Insufficient documentation

## 2020-10-01 DIAGNOSIS — C775 Secondary and unspecified malignant neoplasm of intrapelvic lymph nodes: Secondary | ICD-10-CM | POA: Diagnosis not present

## 2020-10-02 ENCOUNTER — Ambulatory Visit
Admission: RE | Admit: 2020-10-02 | Discharge: 2020-10-02 | Disposition: A | Discharge status: Home / Self Care | Payer: Medicare Other | Source: Ambulatory Visit | Attending: Radiation Oncology | Admitting: Radiation Oncology

## 2020-10-02 ENCOUNTER — Other Ambulatory Visit: Payer: Self-pay

## 2020-10-02 DIAGNOSIS — C775 Secondary and unspecified malignant neoplasm of intrapelvic lymph nodes: Secondary | ICD-10-CM | POA: Diagnosis not present

## 2020-10-02 DIAGNOSIS — Z510 Encounter for antineoplastic radiation therapy: Secondary | ICD-10-CM | POA: Diagnosis not present

## 2020-10-02 DIAGNOSIS — C61 Malignant neoplasm of prostate: Secondary | ICD-10-CM | POA: Diagnosis not present

## 2020-10-03 ENCOUNTER — Ambulatory Visit
Admission: RE | Admit: 2020-10-03 | Discharge: 2020-10-03 | Disposition: A | Discharge status: Home / Self Care | Payer: Medicare Other | Source: Ambulatory Visit | Attending: Radiation Oncology | Admitting: Radiation Oncology

## 2020-10-03 DIAGNOSIS — C61 Malignant neoplasm of prostate: Secondary | ICD-10-CM | POA: Diagnosis not present

## 2020-10-03 DIAGNOSIS — C775 Secondary and unspecified malignant neoplasm of intrapelvic lymph nodes: Secondary | ICD-10-CM | POA: Diagnosis not present

## 2020-10-03 DIAGNOSIS — Z510 Encounter for antineoplastic radiation therapy: Secondary | ICD-10-CM | POA: Diagnosis not present

## 2020-10-04 ENCOUNTER — Ambulatory Visit
Admission: RE | Admit: 2020-10-04 | Discharge: 2020-10-04 | Disposition: A | Discharge status: Home / Self Care | Payer: Medicare Other | Source: Ambulatory Visit | Attending: Radiation Oncology | Admitting: Radiation Oncology

## 2020-10-04 ENCOUNTER — Other Ambulatory Visit: Payer: Self-pay

## 2020-10-04 DIAGNOSIS — C775 Secondary and unspecified malignant neoplasm of intrapelvic lymph nodes: Secondary | ICD-10-CM | POA: Diagnosis not present

## 2020-10-04 DIAGNOSIS — C61 Malignant neoplasm of prostate: Secondary | ICD-10-CM

## 2020-10-04 DIAGNOSIS — Z510 Encounter for antineoplastic radiation therapy: Secondary | ICD-10-CM | POA: Diagnosis not present

## 2020-10-05 ENCOUNTER — Ambulatory Visit
Admission: RE | Admit: 2020-10-05 | Discharge: 2020-10-05 | Disposition: A | Discharge status: Home / Self Care | Payer: Medicare Other | Source: Ambulatory Visit | Attending: Radiation Oncology | Admitting: Radiation Oncology

## 2020-10-05 DIAGNOSIS — C61 Malignant neoplasm of prostate: Secondary | ICD-10-CM

## 2020-10-05 DIAGNOSIS — C775 Secondary and unspecified malignant neoplasm of intrapelvic lymph nodes: Secondary | ICD-10-CM | POA: Diagnosis not present

## 2020-10-05 DIAGNOSIS — Z510 Encounter for antineoplastic radiation therapy: Secondary | ICD-10-CM | POA: Diagnosis not present

## 2020-10-05 NOTE — Progress Notes (Signed)
Patient in after completing 3 of 10 radiation treatments to his abd. Patient denies pain, Reports having nausea, vomiting and diarrhea. Patient reports having a poor appetite. Patient denies dysuria, or hematuria. Patient states  Having nocturia, frequency and urgency. Denies skin issues. Patient reports feeling depressed. Patient denied feeling like he wants to harm himself. Offered social work and nutririon referrals. Patient declined at this time.

## 2020-10-08 ENCOUNTER — Ambulatory Visit
Admission: RE | Admit: 2020-10-08 | Discharge: 2020-10-08 | Disposition: A | Discharge status: Home / Self Care | Payer: Medicare Other | Source: Ambulatory Visit | Attending: Radiation Oncology | Admitting: Radiation Oncology

## 2020-10-08 ENCOUNTER — Other Ambulatory Visit: Payer: Self-pay

## 2020-10-08 DIAGNOSIS — C775 Secondary and unspecified malignant neoplasm of intrapelvic lymph nodes: Secondary | ICD-10-CM | POA: Diagnosis not present

## 2020-10-08 DIAGNOSIS — Z510 Encounter for antineoplastic radiation therapy: Secondary | ICD-10-CM | POA: Diagnosis not present

## 2020-10-08 DIAGNOSIS — C61 Malignant neoplasm of prostate: Secondary | ICD-10-CM

## 2020-10-09 ENCOUNTER — Ambulatory Visit
Admission: RE | Admit: 2020-10-09 | Discharge: 2020-10-09 | Disposition: A | Discharge status: Home / Self Care | Payer: Medicare Other | Source: Ambulatory Visit | Attending: Radiation Oncology | Admitting: Radiation Oncology

## 2020-10-09 DIAGNOSIS — C61 Malignant neoplasm of prostate: Secondary | ICD-10-CM | POA: Diagnosis not present

## 2020-10-09 DIAGNOSIS — Z510 Encounter for antineoplastic radiation therapy: Secondary | ICD-10-CM | POA: Diagnosis not present

## 2020-10-09 DIAGNOSIS — C775 Secondary and unspecified malignant neoplasm of intrapelvic lymph nodes: Secondary | ICD-10-CM | POA: Diagnosis not present

## 2020-10-10 ENCOUNTER — Ambulatory Visit
Admission: RE | Admit: 2020-10-10 | Discharge: 2020-10-10 | Disposition: A | Discharge status: Home / Self Care | Payer: Medicare Other | Source: Ambulatory Visit | Attending: Radiation Oncology | Admitting: Radiation Oncology

## 2020-10-10 DIAGNOSIS — C61 Malignant neoplasm of prostate: Secondary | ICD-10-CM

## 2020-10-10 DIAGNOSIS — C775 Secondary and unspecified malignant neoplasm of intrapelvic lymph nodes: Secondary | ICD-10-CM | POA: Diagnosis not present

## 2020-10-10 DIAGNOSIS — Z510 Encounter for antineoplastic radiation therapy: Secondary | ICD-10-CM | POA: Diagnosis not present

## 2020-10-11 ENCOUNTER — Other Ambulatory Visit: Payer: Self-pay | Admitting: Radiation Oncology

## 2020-10-11 ENCOUNTER — Ambulatory Visit
Admission: RE | Admit: 2020-10-11 | Discharge: 2020-10-11 | Disposition: A | Discharge status: Home / Self Care | Payer: Medicare Other | Source: Ambulatory Visit | Attending: Radiation Oncology | Admitting: Radiation Oncology

## 2020-10-11 ENCOUNTER — Other Ambulatory Visit: Payer: Self-pay

## 2020-10-11 DIAGNOSIS — C61 Malignant neoplasm of prostate: Secondary | ICD-10-CM

## 2020-10-11 DIAGNOSIS — Z510 Encounter for antineoplastic radiation therapy: Secondary | ICD-10-CM | POA: Diagnosis not present

## 2020-10-11 DIAGNOSIS — C775 Secondary and unspecified malignant neoplasm of intrapelvic lymph nodes: Secondary | ICD-10-CM | POA: Diagnosis not present

## 2020-10-11 MED ORDER — PROCHLORPERAZINE MALEATE 10 MG PO TABS: 10.0000 mg | ORAL_TABLET | Freq: Four times a day (QID) | ORAL | 3 refills | Status: DC | PRN

## 2020-10-12 ENCOUNTER — Ambulatory Visit
Admission: RE | Admit: 2020-10-12 | Discharge: 2020-10-12 | Disposition: A | Discharge status: Home / Self Care | Payer: Medicare Other | Source: Ambulatory Visit | Attending: Radiation Oncology | Admitting: Radiation Oncology

## 2020-10-12 DIAGNOSIS — C775 Secondary and unspecified malignant neoplasm of intrapelvic lymph nodes: Secondary | ICD-10-CM | POA: Diagnosis not present

## 2020-10-12 DIAGNOSIS — Z510 Encounter for antineoplastic radiation therapy: Secondary | ICD-10-CM | POA: Diagnosis not present

## 2020-10-12 DIAGNOSIS — C61 Malignant neoplasm of prostate: Secondary | ICD-10-CM

## 2020-10-15 ENCOUNTER — Encounter: Payer: Self-pay | Admitting: Urology

## 2020-10-15 ENCOUNTER — Other Ambulatory Visit: Payer: Self-pay

## 2020-10-15 ENCOUNTER — Ambulatory Visit
Admission: RE | Admit: 2020-10-15 | Discharge: 2020-10-15 | Disposition: A | Discharge status: Home / Self Care | Payer: Medicare Other | Source: Ambulatory Visit | Attending: Radiation Oncology | Admitting: Radiation Oncology

## 2020-10-15 DIAGNOSIS — C775 Secondary and unspecified malignant neoplasm of intrapelvic lymph nodes: Secondary | ICD-10-CM | POA: Diagnosis not present

## 2020-10-15 DIAGNOSIS — C61 Malignant neoplasm of prostate: Secondary | ICD-10-CM

## 2020-10-15 DIAGNOSIS — Z510 Encounter for antineoplastic radiation therapy: Secondary | ICD-10-CM | POA: Diagnosis not present

## 2020-11-02 ENCOUNTER — Ambulatory Visit: Payer: Medicare Other | Admitting: Family Medicine

## 2020-11-08 ENCOUNTER — Other Ambulatory Visit: Payer: Self-pay

## 2020-11-08 ENCOUNTER — Encounter: Payer: Self-pay | Admitting: Family Medicine

## 2020-11-08 ENCOUNTER — Ambulatory Visit (INDEPENDENT_AMBULATORY_CARE_PROVIDER_SITE_OTHER): Payer: Medicare Other | Admitting: Family Medicine

## 2020-11-08 ENCOUNTER — Telehealth: Payer: Self-pay | Admitting: Family Medicine

## 2020-11-08 VITALS — BP 120/74 | HR 90 | Ht 64.0 in | Wt 134.0 lb

## 2020-11-08 DIAGNOSIS — E1169 Type 2 diabetes mellitus with other specified complication: Secondary | ICD-10-CM

## 2020-11-08 DIAGNOSIS — E785 Hyperlipidemia, unspecified: Secondary | ICD-10-CM | POA: Diagnosis not present

## 2020-11-08 DIAGNOSIS — E1159 Type 2 diabetes mellitus with other circulatory complications: Secondary | ICD-10-CM

## 2020-11-08 DIAGNOSIS — Z23 Encounter for immunization: Secondary | ICD-10-CM | POA: Diagnosis not present

## 2020-11-08 DIAGNOSIS — I152 Hypertension secondary to endocrine disorders: Secondary | ICD-10-CM

## 2020-11-08 DIAGNOSIS — M1712 Unilateral primary osteoarthritis, left knee: Secondary | ICD-10-CM

## 2020-11-08 DIAGNOSIS — B3749 Other urogenital candidiasis: Secondary | ICD-10-CM

## 2020-11-08 LAB — BAYER DCA HB A1C WAIVED: HB A1C (BAYER DCA - WAIVED): 7.3 % — ABNORMAL HIGH (ref <7.0)

## 2020-11-08 MED ORDER — FLUCONAZOLE 150 MG PO TABS: 150.0000 mg | ORAL_TABLET | ORAL | 0 refills | Status: DC

## 2020-11-08 NOTE — Telephone Encounter (Signed)
Please call back for clarification on fluconazole (DIFLUCAN) 150 MG tablet rx

## 2020-11-08 NOTE — Progress Notes (Signed)
BP 120/74   Pulse 90   Ht '5\' 4"'  (1.626 m)   Wt 134 lb (60.8 kg)   SpO2 95%   BMI 23.00 kg/m    Subjective:   Patient ID: Chase [name], male    DOB: [birth year], 70 y.o.   MRN: [id number]  HPI: Chase [name] is a 70 y.o. male presenting on 11/08/2020 for Hyperlipidemia, Hypertension, and Diabetes   HPI Patient does complain some of left knee osteoarthritis is been bothering him off and on he does have some swelling there.  He is not currently taking Tylenol or ibuprofen or anything for it at this point.  Patient complains of a little bit of yeast at the head of his penis, has had this before and is using a powder for but it is not working completely and occasionally has to take a pill for it but Diflucan and he would like to get some dose of that to help.  He says that just starting and he wants to get it before he gets bad.  He is on Jardiance and that does put him at risk for it.  Type 2 diabetes mellitus Patient comes in today for recheck of his diabetes. Patient has been currently taking Jardiance and metformin, A1c is up to slightly at 7.3. Patient is currently on an ACE inhibitor/ARB. Patient has not seen an ophthalmologist this year. Patient denies any issues with their feet. The symptom started onset as an adult hypertension and hyperlipidemia ARE RELATED TO DM   Hypertension Patient is currently on lisinopril, and their blood pressure today is 120/74. Patient denies any lightheadedness or dizziness. Patient denies headaches, blurred vision, chest pains, shortness of breath, or weakness. Denies any side effects from medication and is content with current medication.   Hyperlipidemia Patient is coming in for recheck of his hyperlipidemia. The patient is currently taking atorvastatin. They deny any issues with myalgias or history of liver damage from it. They deny any focal numbness or weakness or chest pain.   Relevant past medical, surgical, family and social history  reviewed and updated as indicated. Interim medical history since our last visit reviewed. Allergies and medications reviewed and updated.  Review of Systems  Constitutional:  Negative for chills and fever.  Eyes:  Positive for visual disturbance (Patient says he started to have some visual disturbance recommend he go back and see his eye doctor.).  Respiratory:  Negative for shortness of breath and wheezing.   Cardiovascular:  Negative for chest pain and leg swelling.  Musculoskeletal:  Positive for arthralgias. Negative for back pain and gait problem.  Skin:  Positive for rash. Negative for color change.  All other systems reviewed and are negative.  Per HPI unless specifically indicated above   Allergies as of 11/08/2020   No Known Allergies      Medication List        Accurate as of November 08, 2020  8:32 AM. If you have any questions, ask your nurse or doctor.          STOP taking these medications    glipiZIDE 5 MG tablet Commonly known as: GLUCOTROL Stopped by: Worthy Rancher, MD   prochlorperazine 10 MG tablet Commonly known as: COMPAZINE Stopped by: Fransisca Kaufmann Eual Lindstrom, MD   tamsulosin 0.4 MG Caps capsule Commonly known as: FLOMAX Stopped by: Fransisca Kaufmann Rollo Farquhar, MD       TAKE these medications    amitriptyline 25 MG tablet Commonly known as: ELAVIL Take 1 tablet (  25 mg total) by mouth at bedtime.   atorvastatin 40 MG tablet Commonly known as: LIPITOR Take 1 tablet (40 mg total) by mouth every evening.   empagliflozin 25 MG Tabs tablet Commonly known as: Jardiance Take 1 tablet (25 mg total) by mouth daily before breakfast.   glucose blood test strip Use as instructed to test blood sugar once daily.  Please fill One touch verio test strips. DX:E11.69   lisinopril 5 MG tablet Commonly known as: ZESTRIL Take 1 tablet (5 mg total) by mouth daily.   metFORMIN 500 MG tablet Commonly known as: GLUCOPHAGE Take 2 tablets (1,000 mg total) by mouth 2  (two) times daily with a meal.   OneTouch Delica Lancets 14H Misc Use to check blood sugar 2 times daily. E11.69   OneTouch Verio w/Device Kit Take 1 each by mouth in the morning and at bedtime. Use to test blood sugar daily         Objective:   BP 120/74   Pulse 90   Ht '5\' 4"'  (1.626 m)   Wt 134 lb (60.8 kg)   SpO2 95%   BMI 23.00 kg/m   Wt Readings from Last 3 Encounters:  11/08/20 134 lb (60.8 kg)  09/17/20 145 lb (65.8 kg)  08/01/20 145 lb (65.8 kg)    Physical Exam Vitals and nursing note reviewed.  Constitutional:      General: He is not in acute distress.    Appearance: He is well-developed. He is not diaphoretic.  Eyes:     General: No scleral icterus.       Right eye: No discharge.     Conjunctiva/sclera: Conjunctivae normal.     Pupils: Pupils are equal, round, and reactive to light.  Neck:     Thyroid: No thyromegaly.  Cardiovascular:     Rate and Rhythm: Normal rate and regular rhythm.     Heart sounds: Normal heart sounds. No murmur heard. Pulmonary:     Effort: Pulmonary effort is normal. No respiratory distress.     Breath sounds: Normal breath sounds. No wheezing.  Musculoskeletal:        General: Swelling and tenderness (Left knee swelling and tenderness) present. Normal range of motion.     Cervical back: Neck supple.  Lymphadenopathy:     Cervical: No cervical adenopathy.  Skin:    General: Skin is warm and dry.     Findings: No rash.  Neurological:     Mental Status: He is alert and oriented to person, place, and time.     Coordination: Coordination normal.  Psychiatric:        Behavior: Behavior normal.      Assessment & Plan:   Problem List Items Addressed This Visit       Cardiovascular and Mediastinum   Hypertension associated with diabetes ([place])   Relevant Orders   CBC with Differential/Platelet   CMP14+EGFR   Lipase   Bayer DCA Hb A1c Waived     Endocrine   Type 2 diabetes mellitus with other specified complication  ([place]) - Primary   Relevant Orders   CBC with Differential/Platelet   CMP14+EGFR   Lipase   Bayer DCA Hb A1c Waived   Hyperlipidemia associated with type 2 diabetes mellitus ([facility])   Relevant Orders   CBC with Differential/Platelet   CMP14+EGFR   Lipase   Bayer DCA Hb A1c Waived   Lipid panel   Other Visit Diagnoses     Primary osteoarthritis of left knee  Yeast dermatitis of penis       Relevant Medications   fluconazole (DIFLUCAN) 150 MG tablet       Continue current medication, give Diflucan for 3 doses to help with his yeast.  Further arthritis recommended over-the-counter Voltaren gel    Follow up plan: Return in about 3 months (around 02/08/2021), or if symptoms worsen or fail to improve, for Diabetes and hypertension recheck.  Counseling provided for all of the vaccine components Orders Placed This Encounter  Procedures   CBC with Differential/Platelet   CMP14+EGFR   Lipase   Bayer DCA Hb A1c Waived   Lipid panel    Caryl Pina, MD [facility] 11/08/2020, 8:32 AM

## 2020-11-08 NOTE — Telephone Encounter (Signed)
I sent corrected prescription, wanted to be once a week

## 2020-11-08 NOTE — Patient Instructions (Signed)
Voltaren gel para arthritis que se pueded comprar en la [place] sin recete

## 2020-11-09 LAB — CBC WITH DIFFERENTIAL/PLATELET
Basophils Absolute: 0.1 10*3/uL (ref 0.0–0.2)
Basos: 1 %
EOS (ABSOLUTE): 0.7 10*3/uL — ABNORMAL HIGH (ref 0.0–0.4)
Eos: 14 %
Hematocrit: 45.4 % (ref 37.5–51.0)
Hemoglobin: 14.9 g/dL (ref 13.0–17.7)
Immature Grans (Abs): 0.1 10*3/uL (ref 0.0–0.1)
Immature Granulocytes: 1 %
Lymphocytes Absolute: 0.9 10*3/uL (ref 0.7–3.1)
Lymphs: 18 %
MCH: 28.7 pg (ref 26.6–33.0)
MCHC: 32.8 g/dL (ref 31.5–35.7)
MCV: 88 fL (ref 79–97)
Monocytes Absolute: 0.5 10*3/uL (ref 0.1–0.9)
Monocytes: 10 %
Neutrophils Absolute: 2.8 10*3/uL (ref 1.4–7.0)
Neutrophils: 56 %
Platelets: 228 10*3/uL (ref 150–450)
RBC: 5.19 x10E6/uL (ref 4.14–5.80)
RDW: 17 % — ABNORMAL HIGH (ref 11.6–15.4)
WBC: 4.9 10*3/uL (ref 3.4–10.8)

## 2020-11-09 LAB — CMP14+EGFR
ALT: 18 IU/L (ref 0–44)
AST: 15 IU/L (ref 0–40)
Albumin/Globulin Ratio: 2.1 (ref 1.2–2.2)
Albumin: 4.4 g/dL (ref 3.8–4.8)
Alkaline Phosphatase: 65 IU/L (ref 44–121)
BUN/Creatinine Ratio: 15 (ref 10–24)
BUN: 9 mg/dL (ref 8–27)
Bilirubin Total: 0.3 mg/dL (ref 0.0–1.2)
CO2: 22 mmol/L (ref 20–29)
Calcium: 9.6 mg/dL (ref 8.6–10.2)
Chloride: 104 mmol/L (ref 96–106)
Creatinine, Ser: 0.61 mg/dL — ABNORMAL LOW (ref 0.76–1.27)
Globulin, Total: 2.1 g/dL (ref 1.5–4.5)
Glucose: 160 mg/dL — ABNORMAL HIGH (ref 65–99)
Potassium: 4.3 mmol/L (ref 3.5–5.2)
Sodium: 142 mmol/L (ref 134–144)
Total Protein: 6.5 g/dL (ref 6.0–8.5)
eGFR: 105 mL/min/{1.73_m2} (ref >59)

## 2020-11-09 LAB — LIPASE: Lipase: 60 U/L (ref 13–78)

## 2020-11-10 LAB — LIPID PANEL
Chol/HDL Ratio: 4.4 ratio (ref 0.0–5.0)
Cholesterol, Total: 127 mg/dL (ref 100–199)
HDL: 29 mg/dL — ABNORMAL LOW (ref >39)
LDL Chol Calc (NIH): 56 mg/dL (ref 0–99)
Triglycerides: 262 mg/dL — ABNORMAL HIGH (ref 0–149)
VLDL Cholesterol Cal: 42 mg/dL — ABNORMAL HIGH (ref 5–40)

## 2020-11-10 LAB — SPECIMEN STATUS REPORT

## 2020-11-20 DIAGNOSIS — C61 Malignant neoplasm of prostate: Secondary | ICD-10-CM | POA: Diagnosis not present

## 2020-11-20 DIAGNOSIS — R9721 Rising PSA following treatment for malignant neoplasm of prostate: Secondary | ICD-10-CM | POA: Diagnosis not present

## 2020-11-25 ENCOUNTER — Other Ambulatory Visit: Payer: Self-pay | Admitting: Family Medicine

## 2020-11-25 DIAGNOSIS — F410 Panic disorder [episodic paroxysmal anxiety] without agoraphobia: Secondary | ICD-10-CM

## 2020-11-29 ENCOUNTER — Other Ambulatory Visit: Payer: Self-pay

## 2020-11-29 ENCOUNTER — Ambulatory Visit
Admission: RE | Admit: 2020-11-29 | Discharge: 2020-11-29 | Disposition: A | Discharge status: Home / Self Care | Payer: Medicare Other | Source: Ambulatory Visit | Attending: Urology | Admitting: Urology

## 2020-11-29 ENCOUNTER — Encounter: Payer: Self-pay | Admitting: Urology

## 2020-11-29 DIAGNOSIS — C775 Secondary and unspecified malignant neoplasm of intrapelvic lymph nodes: Secondary | ICD-10-CM | POA: Insufficient documentation

## 2020-11-29 DIAGNOSIS — C61 Malignant neoplasm of prostate: Secondary | ICD-10-CM

## 2020-11-29 NOTE — Progress Notes (Signed)
  Radiation Oncology         (336) [phone] ________________________________  Name: Yandriel Boening MRN: [id number]  Date: 10/15/2020  DOB: [birth year]  End of Treatment Note  Diagnosis:   70 y.o. gentleman with oligometastatic adenocarcinoma of the prostate status post previous prostate radiotherapy, now with 1 pelvic and 1 peraaortic lymph node     Indication for treatment:  Curative, Definitive SBRT       Radiation treatment dates:   10/02/20 - 10/15/20  Site/dose:   The two involved nodes were treated to 50 Gy in 10 fractions  with 30 Gy in 10 fractions to the involved and intervening lymph node echelons.  There were some smaller scattered nodes in intervening nodal echelons with lower PET activity.  These nodes were contoured for inclusion in the 30 Gy zone in the event they harbor more microscopic disease.  Beams/energy:   The patient was treated using stereotactic body radiotherapy according to a 3D conformal radiotherapy plan.  Volumetric arc fields were employed to deliver 6 MV X-rays.  Image guidance was performed with per fraction cone beam CT prior to treatment under personal MD supervision.  Immobilization was achieved using BodyFix Pillow.  Narrative: The patient tolerated radiation treatment relatively well.   He did experience some nausea, decreased appetite, fatigue and loose stools. He also reported occasional dysuria, intermittency, and nocturia x2-3/night despite using Flomax.  Plan: The patient has completed radiation treatment. The patient will return to radiation oncology clinic for routine followup in one month. I advised them to call or return sooner if they have any questions or concerns related to their recovery or treatment. ________________________________  Sheral Apley. Tammi Klippel, M.D.

## 2020-11-29 NOTE — Progress Notes (Signed)
Spoke with patient he directed me to call his daughter Verdis Frederickson as he does not speak English and refused an interpreter. Meaningful use complete. Daughter states father complains of a headache most days and also lower abdominal pain. Daughter was unable to answer AUA questions.

## 2020-11-29 NOTE — Progress Notes (Signed)
Radiation Oncology         (336) [phone] ________________________________  Name: Chase [name] MRN: [id number]  Date: 11/29/2020  DOB: 01/29/1951  Post Treatment Note  CC: Dettinger, Fransisca Kaufmann, MD  Ardis Hughs, MD  Diagnosis:   70 y.o. gentleman with oligometastatic adenocarcinoma of the prostate status post previous prostate radiotherapy, now with 1 pelvic and 1 peraaortic lymph node     Interval Since Last Radiation:  6.5 weeks  10/02/20 - 10/15/20//SBRT:  The two involved nodes were treated to 50 Gy in 10 fractions  with 30 Gy in 10 fractions to the involved and intervening lymph node echelons.  There were some smaller scattered nodes in intervening nodal echelons with lower PET activity.  These nodes were contoured for inclusion in the 30 Gy zone in the event they harbor more microscopic disease.  2017:  prostate IMRT in [place]  Narrative:  I spoke with the patient and his daughter, Chase [name], who serves as his interpreter, to conduct his routine scheduled 1 month follow up visit via telephone to spare the patient unnecessary potential exposure in the healthcare setting during the current COVID-19 pandemic.  The patient was notified in advance and gave permission to proceed with this visit format. He tolerated radiation treatment relatively well.   He did experience some nausea, decreased appetite, fatigue and loose stools. He also reported occasional dysuria, intermittency, and nocturia x2-3/night despite using Flomax.                              On review of systems, obtained from daughter, Chase [name] who serves as his interpreter, the patient states that he is doing well in general. His LUTS are back to baseline and he continues taking Flomax daily as prescribed. He specifically denies gross hematuria, dysuria, excessive daytime frequency, urgency, incomplete emptying or incontinence. He continues with some decreased appetite and occasional nausea/vomiting but denies recent fever, chills or  night sweats. He is maintaining his weight. He also has occasional headaches and is scheduled to see his neurologist next month. Overall, he is pleased with his progress to date.  ALLERGIES:  has No Known Allergies.  Meds: Current Outpatient Medications  Medication Sig Dispense Refill   amitriptyline (ELAVIL) 25 MG tablet Take 1 tablet (25 mg total) by mouth at bedtime. 30 tablet 3   atorvastatin (LIPITOR) 40 MG tablet Take 1 tablet (40 mg total) by mouth every evening. 90 tablet 3   Blood Glucose Monitoring Suppl (ONETOUCH VERIO) w/Device KIT Take 1 each by mouth in the morning and at bedtime. Use to test blood sugar daily 1 kit 1   empagliflozin (JARDIANCE) 25 MG TABS tablet Take 1 tablet (25 mg total) by mouth daily before breakfast. 90 tablet 3   fluconazole (DIFLUCAN) 150 MG tablet Take 1 tablet (150 mg total) by mouth once a week. 3 tablet 0   glucose blood test strip Use as instructed to test blood sugar once daily.  Please fill One touch verio test strips. DX:E11.69 100 each 12   lisinopril (ZESTRIL) 5 MG tablet Take 1 tablet (5 mg total) by mouth daily. 90 tablet 3   metFORMIN (GLUCOPHAGE) 500 MG tablet Take 2 tablets (1,000 mg total) by mouth 2 (two) times daily with a meal. 180 tablet 3   OneTouch Delica Lancets 73A MISC Use to check blood sugar 2 times daily. E11.69 100 each 4   No current facility-administered medications for this encounter.  Physical Findings:  vitals were not taken for this visit.  Pain Assessment Pain Score: 5  Pain Frequency: Constant Pain Loc: Abdomen/10 Unable to assess due to telephone follow up visit format.  Lab Findings: Lab Results  Component Value Date   WBC 4.9 11/08/2020   HGB 14.9 11/08/2020   HCT 45.4 11/08/2020   MCV 88 11/08/2020   PLT 228 11/08/2020     Radiographic Findings: No results found.  Impression/Plan: 1. 70 y.o. gentleman with oligometastatic adenocarcinoma of the prostate status post previous prostate  radiotherapy, now with 1 pelvic and 1 peraaortic lymph node. He appears to have recovered well from the effects of his recent SBRT. He had a recent follow up visit with Dr. Louis Meckel on 11/20/20 and his PSA was 8.35 which is increased from 6.91 in 04/2020 but this was checked within 3 weeks of completing his radiation so it is difficult to really interpret the significance of this number. We discussed this today and advised that we will have a better idea of how his disease is responding to the radiation at the time of his repeat labs in 01/2021. We discussed that while we are happy to continue to participate in his care if clinically indicated, at this point, we will plan to see him back on an as-needed basis.  He will continue in routine follow-up under the care and direction of Dr. Louis Meckel for continued monitoring and management of his systemic disease.  They appear to have a good understanding and are comfortable and in agreement with the stated plan.  They are welcome to call at anytime with any questions related to his radiation.    Nicholos Johns, PA-C

## 2020-12-10 ENCOUNTER — Ambulatory Visit (INDEPENDENT_AMBULATORY_CARE_PROVIDER_SITE_OTHER): Payer: Medicare Other | Admitting: Family Medicine

## 2020-12-10 ENCOUNTER — Encounter: Payer: Self-pay | Admitting: Family Medicine

## 2020-12-10 DIAGNOSIS — F410 Panic disorder [episodic paroxysmal anxiety] without agoraphobia: Secondary | ICD-10-CM | POA: Diagnosis not present

## 2020-12-10 DIAGNOSIS — F419 Anxiety disorder, unspecified: Secondary | ICD-10-CM

## 2020-12-10 MED ORDER — DULOXETINE HCL 30 MG PO CPEP: 30.0000 mg | ORAL_CAPSULE | Freq: Every day | ORAL | 1 refills | Status: DC

## 2020-12-10 MED ORDER — HYDROXYZINE PAMOATE 25 MG PO CAPS: 25.0000 mg | ORAL_CAPSULE | Freq: Three times a day (TID) | ORAL | 1 refills | Status: DC | PRN

## 2020-12-10 NOTE — Progress Notes (Signed)
Virtual Visit  Note Due to COVID-19 pandemic this visit was conducted virtually. This visit type was conducted due to national recommendations for restrictions regarding the COVID-19 Pandemic (e.g. social distancing, sheltering in place) in an effort to limit this patient's exposure and mitigate transmission in our community. All issues noted in this document were discussed and addressed.  A physical exam was not performed with this format.  I connected with Chase [name] on 12/10/20 at 1438 by telephone and verified that I am speaking with the correct person using two identifiers. Chase [name] is currently located at home and his wife is currently with him during the visit. The provider, Gwenlyn Perking, FNP is located in their office at time of visit.  I discussed the limitations, risks, security and privacy concerns of performing an evaluation and management service by telephone and the availability of in person appointments. I also discussed with the patient that there may be a patient responsible charge related to this service. The patient expressed understanding and agreed to proceed.  CC: anxiety  History and Present Illness:  HPI  Interpreter Malachy Mood [phone] was used during the entirety of this visit today.   Chase [name] reports a history of anxiety and panic attacks that started 2 years ago. He was previously on Cymbalta and hydroxyzine for this and this controlled his symptoms well. He is not currently on these medication. For the last 2 weeks he has had an increase in anxiety and has had panic attacks in the afternoons. He reports sudden episodes of difficulty breathing, anxiety, chest pain, and the feeling the run away at one time. He has also been feeling down and depressed. He is not sleeping well and is having headaches. He would like to restart on these medications. He denies SI. Attempted to completed PHQ and GAD screenings tools but patient had difficulty understanding the  questions.   ROS As per HPI.   Observations/Objective: Alert and oriented x 3. Able to speak in full sentences without difficulty.    Assessment and Plan: Chase [name] was seen today for anxiety.  Diagnoses and all orders for this visit:  Anxiety Panic attacks Restart on cymbalta as below. Hydroxyzine as needed. Return to office for new or worsening symptoms, or if symptoms persist.  -     DULoxetine (CYMBALTA) 30 MG capsule; Take 1 capsule (30 mg total) by mouth daily. -     hydrOXYzine (VISTARIL) 25 MG capsule; Take 1 capsule (25 mg total) by mouth 3 (three) times daily as needed for anxiety.    Follow Up Instructions: 4-6 weeks with PCP for medication follow, sooner if needed.     I discussed the assessment and treatment plan with the patient. The patient was provided an opportunity to ask questions and all were answered. The patient agreed with the plan and demonstrated an understanding of the instructions.   The patient was advised to call back or seek an in-person evaluation if the symptoms worsen or if the condition fails to improve as anticipated.  The above assessment and management plan was discussed with the patient. The patient verbalized understanding of and has agreed to the management plan. Patient is aware to call the clinic if symptoms persist or worsen. Patient is aware when to return to the clinic for a follow-up visit. Patient educated on when it is appropriate to go to the emergency department.   Time call ended:  1452  I provided 14 minutes of  non face-to-face time during this encounter.  Gwenlyn Perking, FNP

## 2020-12-26 ENCOUNTER — Other Ambulatory Visit: Payer: Self-pay | Admitting: Family Medicine

## 2020-12-26 DIAGNOSIS — E1169 Type 2 diabetes mellitus with other specified complication: Secondary | ICD-10-CM

## 2021-01-07 ENCOUNTER — Ambulatory Visit: Payer: Medicare Other | Admitting: Neurology

## 2021-01-25 NOTE — Progress Notes (Signed)
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 Improve sleep hygiene Routine exercise Hydration Management of anxiety If anxiety or headaches do not improve, consider increasing Cymbalta to 60mg  daily Follow up as needed.  Subjective:  Chase [name] is a 70 year old Hispanic man with HTN, type 2 diabetes mellitus, hyperlipidemia and history of prostate cancer 2017 who follows up for headaches.  He is accompanied by his daughter who supplements history.     UPDATE: Last seen a year ago.  At that time, restarted amitriptyline.  He also had impacted molars extracted which provided mild relief.  He subsequently stopped amitriptyline.  Headaches are mild but gets worse later in day, lasts 30 minutes but will return later that day and are occurring daily.  Takes ibuprofen and Tylenol daily.  A good night's sleep helps.  Restarted Cymbalta last month for anxiety.   Current NSAIDS:  Advil Current analgesics:  Tylenol Current triptans:  none Current ergotamine:  none Current anti-emetic:  none Current muscle relaxants:  none Current anti-anxiolytic:  none Current sleep aide:  none Current Antihypertensive medications:  Lisinopril 5mg  daily Current Antidepressant medications:  Amitriptyline 25mg  QHS; Cymbalta 30mg  daily Current Anticonvulsant medications:  none Current anti-CGRP:  none Current Vitamins/Herbal/Supplements:  none Current Antihistamines/Decongestants:  hydroxyzine (anxiety) Other therapy:  Cold compresses Hormone/birth control:  none Other medications:  Albuterol; atorvastatin 40mg ; metformin; Jardiance  Depression and significant anxiety and panic attacks.   HISTORY: He began having headaches a in 2020 and has gradually progressed.  Denies  preceding trigger.  Initially it was a moderate bifrontal non-throbbing pressure headache.  It became more frequent.  It changed to 7/10 pressure involving the entire right half of his head and face, including his ear.  It is near-persistent but may stop for a few minutes.  He notes associated nausea, blurred vision in the right eye, photophobia, phonophobia, bilateral jaw pain (chewing aggravates pain) and tinnitus in the right ear, described as a buzzing sound.  When he is exposed to loud nose, he feels a pounding in his head.  He uses cold compresses.  Headache is a little better after he slept, but he has not been able to sleep due to the pain.  He has not seen the ophthalmologist or dentist.  He denies numbness and tingling, neck pain or pain radiating into his extremities.   He has history of cervical spinal fusion in 2016 as well as multiple lumbar spine surgeries.  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  He also has residual left leg weakness related to his lower back.  He takes Advil or Tylenol daily.   Past NSAIDS:  naproxen Past analgesics:  none Past abortive triptans:  none Past abortive ergotamine:  none Past muscle relaxants:  Flexeril; tizanidine Past anti-emetic:  Zofran ODT 4mg  Past antihypertensive medications:  none Past antidepressant medications:  Cymbalta 30mg ; trazodone Past anticonvulsant medications:  none Past anti-CGRP:  none Past vitamins/Herbal/Supplements:  none Past antihistamines/decongestants:  none Other past therapies:  none   Cervical spine XR from 11/29/2015 personally reviewed and demonstrated C4-C6 fusion post-surgical changes with degenerative changes at C3-4 and C6-7  PAST MEDICAL HISTORY: Past Medical History:  Diagnosis Date   Arthritis    Cancer (  [place])    prostate 2017   Depression    Diabetes mellitus without complication (HCC)    Headache    High cholesterol    Hypertension     Inguinal hernia    rt   Neck pain    Numbness and tingling in hands    Urinary frequency     MEDICATIONS: Current Outpatient Medications on File Prior to Visit  Medication Sig Dispense Refill   amitriptyline (ELAVIL) 25 MG tablet Take 1 tablet (25 mg total) by mouth at bedtime. 30 tablet 3   atorvastatin (LIPITOR) 40 MG tablet Take 1 tablet (40 mg total) by mouth every evening. 90 tablet 3   Blood Glucose Monitoring Suppl (ONETOUCH VERIO) w/Device KIT Take 1 each by mouth in the morning and at bedtime. Use to test blood sugar daily 1 kit 1   DULoxetine (CYMBALTA) 30 MG capsule Take 1 capsule (30 mg total) by mouth daily. 30 capsule 1   empagliflozin (JARDIANCE) 25 MG TABS tablet Take 1 tablet (25 mg total) by mouth daily before breakfast. 90 tablet 3   fluconazole (DIFLUCAN) 150 MG tablet Take 1 tablet (150 mg total) by mouth once a week. 3 tablet 0   glucose blood test strip Use as instructed to test blood sugar once daily.  Please fill One touch verio test strips. DX:E11.69 100 each 12   hydrOXYzine (VISTARIL) 25 MG capsule Take 1 capsule (25 mg total) by mouth 3 (three) times daily as needed for anxiety. 90 capsule 1   lisinopril (ZESTRIL) 5 MG tablet Take 1 tablet (5 mg total) by mouth daily. 90 tablet 3   metFORMIN (GLUCOPHAGE) 500 MG tablet TAKE 2 TABLETS BY MOUTH TWICE DAILY WITH A MEAL 180 tablet 0   OneTouch Delica Lancets 81X MISC Use to check blood sugar 2 times daily. E11.69 100 each 4   No current facility-administered medications on file prior to visit.    ALLERGIES: No Known Allergies  FAMILY HISTORY: Family History  Problem Relation Age of Onset   Colon cancer Neg Hx    Colon polyps Neg Hx       Objective:  Blood pressure 116/68, pulse 72, height _0  (1.6 m), weight 126 lb (57.2 kg), SpO2 98 %. General: No acute distress.  Patient appears well-groomed.   Head:  Normocephalic/atraumatic Eyes:  Fundi examined but not visualized Neck: supple, no paraspinal  tenderness, full range of motion Heart:  Regular rate and rhythm Lungs:  Clear to auscultation bilaterally Back: No paraspinal tenderness Neurological Exam: alert and oriented to person, place, and time.  Speech fluent and not dysarthric, language intact.  CN II-XII intact. Bulk and tone normal, muscle strength 4+/5 left hand grip and left lower extremity, otherwise 5/5 throughout.  Sensation to light touch intact.  Deep tendon reflexes 3+ upper extremities with left Hoffman's sign, 2+ lower extremities with bilateral Babinski  Finger to nose testing intact.  Antalgic gait.  Romberg with sway.   Metta Clines, DO  CC: Caryl Pina, MD

## 2021-01-28 ENCOUNTER — Ambulatory Visit (INDEPENDENT_AMBULATORY_CARE_PROVIDER_SITE_OTHER): Payer: Medicare Other | Admitting: Neurology

## 2021-01-28 ENCOUNTER — Encounter: Payer: Self-pay | Admitting: Neurology

## 2021-01-28 ENCOUNTER — Other Ambulatory Visit: Payer: Self-pay

## 2021-01-28 VITALS — BP 116/68 | HR 72 | Ht 63.0 in | Wt 126.0 lb

## 2021-01-28 DIAGNOSIS — G44229 Chronic tension-type headache, not intractable: Secondary | ICD-10-CM

## 2021-01-28 NOTE — Patient Instructions (Signed)
  Limit use of pain relievers to no more than 2 days out of the week.  These medications include acetaminophen, NSAIDs (ibuprofen/Advil/Motrin, naproxen/Aleve, triptans (Imitrex/sumatriptan), Excedrin, and narcotics.  This will help reduce risk of rebound headaches. Routine exercise Stay adequately hydrated (aim for 64 oz water daily) Keep headache diary Maintain proper stress management Maintain proper sleep hygiene Do not skip meals Consider supplements:  magnesium citrate '400mg'$  daily, riboflavin '400mg'$  daily, coenzyme Q10 '100mg'$  three times daily. Follow up as needed.

## 2021-02-01 ENCOUNTER — Ambulatory Visit (INDEPENDENT_AMBULATORY_CARE_PROVIDER_SITE_OTHER): Payer: Medicare Other | Admitting: Family Medicine

## 2021-02-01 ENCOUNTER — Encounter: Payer: Self-pay | Admitting: Family Medicine

## 2021-02-01 ENCOUNTER — Other Ambulatory Visit: Payer: Self-pay

## 2021-02-01 VITALS — BP 125/80 | HR 87 | Ht 63.0 in | Wt 135.0 lb

## 2021-02-01 DIAGNOSIS — F410 Panic disorder [episodic paroxysmal anxiety] without agoraphobia: Secondary | ICD-10-CM

## 2021-02-01 DIAGNOSIS — F419 Anxiety disorder, unspecified: Secondary | ICD-10-CM | POA: Diagnosis not present

## 2021-02-01 DIAGNOSIS — E1169 Type 2 diabetes mellitus with other specified complication: Secondary | ICD-10-CM

## 2021-02-01 DIAGNOSIS — E1159 Type 2 diabetes mellitus with other circulatory complications: Secondary | ICD-10-CM | POA: Diagnosis not present

## 2021-02-01 DIAGNOSIS — E785 Hyperlipidemia, unspecified: Secondary | ICD-10-CM | POA: Diagnosis not present

## 2021-02-01 DIAGNOSIS — I152 Hypertension secondary to endocrine disorders: Secondary | ICD-10-CM

## 2021-02-01 LAB — BAYER DCA HB A1C WAIVED: HB A1C (BAYER DCA - WAIVED): 6.6 % (ref <7.0)

## 2021-02-01 MED ORDER — BLOOD GLUCOSE MONITOR KIT: PACK | 0 refills | Status: AC

## 2021-02-01 MED ORDER — DULOXETINE HCL 30 MG PO CPEP: 30.0000 mg | ORAL_CAPSULE | Freq: Every day | ORAL | 3 refills | Status: DC

## 2021-02-01 MED ORDER — ATORVASTATIN CALCIUM 40 MG PO TABS: 40.0000 mg | ORAL_TABLET | Freq: Every evening | ORAL | 3 refills | Status: DC

## 2021-02-01 MED ORDER — LISINOPRIL 5 MG PO TABS: 5.0000 mg | ORAL_TABLET | Freq: Every day | ORAL | 3 refills | Status: DC

## 2021-02-01 MED ORDER — METFORMIN HCL 500 MG PO TABS: 1000.0000 mg | ORAL_TABLET | Freq: Two times a day (BID) | ORAL | 3 refills | Status: DC

## 2021-02-01 MED ORDER — EMPAGLIFLOZIN 25 MG PO TABS: 25.0000 mg | ORAL_TABLET | Freq: Every day | ORAL | 3 refills | Status: DC

## 2021-02-01 NOTE — Progress Notes (Signed)
BP 125/80   Pulse 87   Ht '5\' 3"'  (1.6 m)   Wt 135 lb (61.2 kg)   SpO2 99%   BMI 23.91 kg/m    Subjective:   Patient ID: Chase [name], male    DOB: [birth year], 70 y.o.   MRN: [id number]  HPI: Chase [name] is a 70 y.o. male presenting on 02/01/2021 for Medical Management of Chronic Issues and Diabetes   HPI Type 2 diabetes mellitus Patient comes in today for recheck of his diabetes. Patient has been currently taking Jardiance and metformin. Patient is currently on an ACE inhibitor/ARB. Patient has not seen an ophthalmologist this year. Patient denies any issues with their feet. The symptom started onset as an adult hyperlipidemia and hypertension ARE RELATED TO DM   Hyperlipidemia Patient is coming in for recheck of his hyperlipidemia. The patient is currently taking atorvastatin. They deny any issues with myalgias or history of liver damage from it. They deny any focal numbness or weakness or chest pain.   Hypertension Patient is currently on lisinopril, and their blood pressure today is 125/80. Patient denies any lightheadedness or dizziness. Patient denies headaches, blurred vision, chest pains, shortness of breath, or weakness. Denies any side effects from medication and is content with current medication.   Anxiety panic attack recheck Patient is coming in today for anxiety and panic attack recheck.  He was started on Cymbalta just over a month ago and also taken hydroxyzine as needed.  Patient feels like things are going well and the medications working very well for his anxiety.  He is very happy with the Cymbalta. Depression screen [facility] 2/9 02/01/2021 11/08/2020 08/27/2020 08/27/2020 08/01/2020  Decreased Interest 0 0 - 0 0  Down, Depressed, Hopeless '1 1 1 ' 0 0  PHQ - 2 Score '1 1 1 ' 0 0  Altered sleeping - - - - -  Tired, decreased energy - - - - -  Change in appetite - - - - -  Feeling bad or failure about yourself  - - - - -  Trouble concentrating - - - - -  Moving slowly  or fidgety/restless - - - - -  Suicidal thoughts - - - - -  PHQ-9 Score - - - - -  Difficult doing work/chores - - - - -     Relevant past medical, surgical, family and social history reviewed and updated as indicated. Interim medical history since our last visit reviewed. Allergies and medications reviewed and updated.  Review of Systems  Constitutional:  Negative for chills and fever.  Respiratory:  Negative for shortness of breath and wheezing.   Cardiovascular:  Negative for chest pain and leg swelling.  Musculoskeletal:  Negative for back pain and gait problem.  Skin:  Negative for rash.  Neurological:  Negative for dizziness.  Psychiatric/Behavioral:  Negative for decreased concentration, dysphoric mood, self-injury, sleep disturbance and suicidal ideas. The patient is not nervous/anxious.   All other systems reviewed and are negative.  Per HPI unless specifically indicated above   Allergies as of 02/01/2021   No Known Allergies      Medication List        Accurate as of February 01, 2021  4:14 PM. If you have any questions, ask your nurse or doctor.          STOP taking these medications    fluconazole 150 MG tablet Commonly known as: Diflucan Stopped by: Worthy Rancher, MD       TAKE these  medications    amitriptyline 25 MG tablet Commonly known as: ELAVIL Take 1 tablet (25 mg total) by mouth at bedtime.   atorvastatin 40 MG tablet Commonly known as: LIPITOR Take 1 tablet (40 mg total) by mouth every evening.   blood glucose meter kit and supplies Kit Dispense based on patient and insurance preference. Use up to four times daily as directed. Started by: Worthy Rancher, MD   DULoxetine 30 MG capsule Commonly known as: CYMBALTA Take 1 capsule (30 mg total) by mouth daily.   empagliflozin 25 MG Tabs tablet Commonly known as: Jardiance Take 1 tablet (25 mg total) by mouth daily before breakfast.   glucose blood test strip Use as  instructed to test blood sugar once daily.  Please fill One touch verio test strips. DX:E11.69   hydrOXYzine 25 MG capsule Commonly known as: VISTARIL Take 1 capsule (25 mg total) by mouth 3 (three) times daily as needed for anxiety.   lisinopril 5 MG tablet Commonly known as: ZESTRIL Take 1 tablet (5 mg total) by mouth daily.   metFORMIN 500 MG tablet Commonly known as: GLUCOPHAGE Take 2 tablets (1,000 mg total) by mouth 2 (two) times daily with a meal. What changed: See the new instructions. Changed by: Fransisca Kaufmann Peachie Barkalow, MD   OneTouch Delica Lancets 83T Misc Use to check blood sugar 2 times daily. E11.69   OneTouch Verio w/Device Kit Take 1 each by mouth in the morning and at bedtime. Use to test blood sugar daily         Objective:   BP 125/80   Pulse 87   Ht '5\' 3"'  (1.6 m)   Wt 135 lb (61.2 kg)   SpO2 99%   BMI 23.91 kg/m   Wt Readings from Last 3 Encounters:  02/01/21 135 lb (61.2 kg)  01/28/21 126 lb (57.2 kg)  11/08/20 134 lb (60.8 kg)    Physical Exam Vitals and nursing note reviewed.  Constitutional:      General: He is not in acute distress.    Appearance: He is well-developed. He is not diaphoretic.  Eyes:     General: No scleral icterus.    Conjunctiva/sclera: Conjunctivae normal.  Neck:     Thyroid: No thyromegaly.  Cardiovascular:     Rate and Rhythm: Normal rate and regular rhythm.     Heart sounds: Normal heart sounds. No murmur heard. Pulmonary:     Effort: Pulmonary effort is normal. No respiratory distress.     Breath sounds: Normal breath sounds. No wheezing.  Musculoskeletal:        General: Normal range of motion.     Cervical back: Neck supple.  Lymphadenopathy:     Cervical: No cervical adenopathy.  Skin:    General: Skin is warm and dry.     Findings: No rash.  Neurological:     Mental Status: He is alert and oriented to person, place, and time.     Coordination: Coordination normal.  Psychiatric:        Behavior: Behavior  normal.      Assessment & Plan:   Problem List Items Addressed This Visit       Cardiovascular and Mediastinum   Hypertension associated with diabetes ([place])   Relevant Medications   empagliflozin (JARDIANCE) 25 MG TABS tablet   lisinopril (ZESTRIL) 5 MG tablet   metFORMIN (GLUCOPHAGE) 500 MG tablet   atorvastatin (LIPITOR) 40 MG tablet     Endocrine   Type 2 diabetes mellitus with  other specified complication ([place]) - Primary   Relevant Medications   empagliflozin (JARDIANCE) 25 MG TABS tablet   lisinopril (ZESTRIL) 5 MG tablet   metFORMIN (GLUCOPHAGE) 500 MG tablet   atorvastatin (LIPITOR) 40 MG tablet   Other Relevant Orders   Bayer DCA Hb A1c Waived   Hyperlipidemia associated with type 2 diabetes mellitus (HCC)   Relevant Medications   empagliflozin (JARDIANCE) 25 MG TABS tablet   lisinopril (ZESTRIL) 5 MG tablet   metFORMIN (GLUCOPHAGE) 500 MG tablet   atorvastatin (LIPITOR) 40 MG tablet   Other Visit Diagnoses     Anxiety       Relevant Medications   DULoxetine (CYMBALTA) 30 MG capsule   Panic attacks       Relevant Medications   DULoxetine (CYMBALTA) 30 MG capsule       A1c looks good at 6.6, no change in medication. Follow up plan: Return in about 3 months (around 05/03/2021), or if symptoms worsen or fail to improve, for Diabetes and hypertension cholesterol.  Counseling provided for all of the vaccine components Orders Placed This Encounter  Procedures   Bayer [place] Hb A1c [place] Bodin Gorka, MD [facility] 02/01/2021, 4:14 PM

## 2021-02-18 DIAGNOSIS — R9721 Rising PSA following treatment for malignant neoplasm of prostate: Secondary | ICD-10-CM | POA: Diagnosis not present

## 2021-02-25 DIAGNOSIS — C778 Secondary and unspecified malignant neoplasm of lymph nodes of multiple regions: Secondary | ICD-10-CM | POA: Diagnosis not present

## 2021-02-25 DIAGNOSIS — R9721 Rising PSA following treatment for malignant neoplasm of prostate: Secondary | ICD-10-CM | POA: Diagnosis not present

## 2021-05-06 ENCOUNTER — Encounter: Payer: Self-pay | Admitting: Family Medicine

## 2021-05-06 ENCOUNTER — Ambulatory Visit (INDEPENDENT_AMBULATORY_CARE_PROVIDER_SITE_OTHER): Payer: Medicare Other | Admitting: Family Medicine

## 2021-05-06 VITALS — BP 116/78 | HR 69 | Ht 63.0 in | Wt 138.0 lb

## 2021-05-06 DIAGNOSIS — E1169 Type 2 diabetes mellitus with other specified complication: Secondary | ICD-10-CM

## 2021-05-06 DIAGNOSIS — Z23 Encounter for immunization: Secondary | ICD-10-CM

## 2021-05-06 DIAGNOSIS — M4726 Other spondylosis with radiculopathy, lumbar region: Secondary | ICD-10-CM

## 2021-05-06 DIAGNOSIS — E785 Hyperlipidemia, unspecified: Secondary | ICD-10-CM

## 2021-05-06 DIAGNOSIS — E1159 Type 2 diabetes mellitus with other circulatory complications: Secondary | ICD-10-CM | POA: Diagnosis not present

## 2021-05-06 DIAGNOSIS — G4700 Insomnia, unspecified: Secondary | ICD-10-CM | POA: Diagnosis not present

## 2021-05-06 DIAGNOSIS — I152 Hypertension secondary to endocrine disorders: Secondary | ICD-10-CM

## 2021-05-06 LAB — BAYER DCA HB A1C WAIVED: HB A1C (BAYER DCA - WAIVED): 7.1 % — ABNORMAL HIGH (ref 4.8–5.6)

## 2021-05-06 MED ORDER — METHYLPREDNISOLONE ACETATE 80 MG/ML IJ SUSP
80.0000 mg | Freq: Once | INTRAMUSCULAR | Status: AC
  Administered 2021-05-06: 80 mg via INTRAMUSCULAR

## 2021-05-06 NOTE — Addendum Note (Signed)
Addended by: Alphonzo Dublin on: 05/06/2021 04:25 PM   Modules accepted: Orders

## 2021-05-06 NOTE — Progress Notes (Addendum)
BP 116/78   Pulse 69   Ht '5\' 3"'  (1.6 m)   Wt 138 lb (62.6 kg)   SpO2 97%   BMI 24.45 kg/m    Subjective:   Patient ID: Chase [name], male    DOB: 1951-03-26, 70 y.o.   MRN: [id number]  HPI: Chase [name] is a 70 y.o. male presenting on 05/06/2021 for Medical Management of Chronic Issues, Diabetes, Hyperlipidemia, and Hypertension   HPI Type 2 diabetes mellitus Patient comes in today for recheck of his diabetes. Patient has been currently taking Jardiance and metformin and A1c is 7.1. Patient is currently on an ACE inhibitor/ARB. Patient has seen an ophthalmologist this year. Patient denies any issues with their feet. The symptom started onset as an adult htn and hld ARE RELATED TO DM   Hyperlipidemia Patient is coming in for recheck of his hyperlipidemia. The patient is currently taking Lipitor. They deny any issues with myalgias or history of liver damage from it. They deny any focal numbness or weakness or chest pain.   Hypertension Patient is currently on lisinopril, and their blood pressure today is 116/78. Patient denies any lightheadedness or dizziness. Patient denies headaches, blurred vision, chest pains, shortness of breath, or weakness. Denies any side effects from medication and is content with current medication.   Patient has complained about bilateral sciatic pain that kicked up again.  He had an injection for this last year when it was just the right side and notes both sides.  The injection helped a lot and it was a year ago and so he would like another 1.  Relevant past medical, surgical, family and social history reviewed and updated as indicated. Interim medical history since our last visit reviewed. Allergies and medications reviewed and updated.  Review of Systems  Constitutional:  Negative for chills and fever.  Eyes:  Negative for visual disturbance.  Respiratory:  Negative for shortness of breath and wheezing.   Cardiovascular:  Negative for chest  pain and leg swelling.  Musculoskeletal:  Positive for arthralgias, back pain and myalgias. Negative for gait problem.  Skin:  Negative for rash.  Neurological:  Negative for dizziness, weakness and light-headedness.  Psychiatric/Behavioral:  Positive for sleep disturbance. Negative for dysphoric mood. The patient is not nervous/anxious.   All other systems reviewed and are negative.  Per HPI unless specifically indicated above   Allergies as of 05/06/2021   No Known Allergies      Medication List        Accurate as of May 06, 2021  3:53 PM. If you have any questions, ask your nurse or doctor.          amitriptyline 25 MG tablet Commonly known as: ELAVIL Take 1 tablet (25 mg total) by mouth at bedtime.   atorvastatin 40 MG tablet Commonly known as: LIPITOR Take 1 tablet (40 mg total) by mouth every evening.   blood glucose meter kit and supplies Kit Dispense based on patient and insurance preference. Use up to four times daily as directed.   DULoxetine 30 MG capsule Commonly known as: CYMBALTA Take 1 capsule (30 mg total) by mouth daily.   empagliflozin 25 MG Tabs tablet Commonly known as: Jardiance Take 1 tablet (25 mg total) by mouth daily before breakfast.   glucose blood test strip Use as instructed to test blood sugar once daily.  Please fill One touch verio test strips. DX:E11.69   hydrOXYzine 25 MG capsule Commonly known as: VISTARIL Take 1 capsule (25 mg  total) by mouth 3 (three) times daily as needed for anxiety.   lisinopril 5 MG tablet Commonly known as: ZESTRIL Take 1 tablet (5 mg total) by mouth daily.   metFORMIN 500 MG tablet Commonly known as: GLUCOPHAGE Take 2 tablets (1,000 mg total) by mouth 2 (two) times daily with a meal.   OneTouch Delica Lancets 09O Misc Use to check blood sugar 2 times daily. E11.69   OneTouch Verio w/Device Kit Take 1 each by mouth in the morning and at bedtime. Use to test blood sugar daily          Objective:   BP 116/78   Pulse 69   Ht '5\' 3"'  (1.6 m)   Wt 138 lb (62.6 kg)   SpO2 97%   BMI 24.45 kg/m   Wt Readings from Last 3 Encounters:  05/06/21 138 lb (62.6 kg)  02/01/21 135 lb (61.2 kg)  01/28/21 126 lb (57.2 kg)    Physical Exam Vitals and nursing note reviewed.  Constitutional:      General: He is not in acute distress.    Appearance: He is well-developed. He is not diaphoretic.  Eyes:     General: No scleral icterus.    Conjunctiva/sclera: Conjunctivae normal.  Neck:     Thyroid: No thyromegaly.  Cardiovascular:     Rate and Rhythm: Normal rate and regular rhythm.     Heart sounds: Normal heart sounds. No murmur heard. Pulmonary:     Effort: Pulmonary effort is normal. No respiratory distress.     Breath sounds: Normal breath sounds. No wheezing.  Musculoskeletal:        General: Tenderness (Bilateral lower back) present. Normal range of motion.     Cervical back: Neck supple.  Lymphadenopathy:     Cervical: No cervical adenopathy.  Skin:    General: Skin is warm and dry.     Findings: No rash.  Neurological:     Mental Status: He is alert and oriented to person, place, and time.     Coordination: Coordination normal.  Psychiatric:        Behavior: Behavior normal.      Assessment & Plan:   Problem List Items Addressed This Visit       Cardiovascular and Mediastinum   Hypertension associated with diabetes ([place])   Relevant Orders   CBC with Differential/Platelet   CMP14+EGFR   Lipid panel   Bayer DCA Hb A1c Waived     Endocrine   Type 2 diabetes mellitus with other specified complication ([place]) - Primary   Relevant Orders   CBC with Differential/Platelet   CMP14+EGFR   Lipid panel   Bayer DCA Hb A1c Waived   Hyperlipidemia associated with type 2 diabetes mellitus ([place])   Relevant Orders   CBC with Differential/Platelet   CMP14+EGFR   Lipid panel   Bayer DCA Hb A1c Waived   Other Visit Diagnoses     Insomnia, unspecified  type       Need for immunization against influenza           A1c looks decent at 7.1, not going to change any of his medicines.  Continue with diet and exercise, it is actually slightly down from where it was at 7.3. Follow up plan: Return in about 3 months (around 08/04/2021), or if symptoms worsen or fail to improve, for Diabetes and hypertension.  Counseling provided for all of the vaccine components Orders Placed This Encounter  Procedures   CBC with Differential/Platelet  CMP14+EGFR   Lipid panel   Bayer DCA Hb A1c [place], MD [facility] 05/06/2021, 3:53 PM

## 2021-05-07 LAB — CMP14+EGFR
ALT: 21 IU/L (ref 0–44)
AST: 16 IU/L (ref 0–40)
Albumin/Globulin Ratio: 2.1 (ref 1.2–2.2)
Albumin: 4.5 g/dL (ref 3.8–4.8)
Alkaline Phosphatase: 64 IU/L (ref 44–121)
BUN/Creatinine Ratio: 21 (ref 10–24)
BUN: 14 mg/dL (ref 8–27)
Bilirubin Total: 0.2 mg/dL (ref 0.0–1.2)
CO2: 23 mmol/L (ref 20–29)
Calcium: 9.5 mg/dL (ref 8.6–10.2)
Chloride: 104 mmol/L (ref 96–106)
Creatinine, Ser: 0.66 mg/dL — ABNORMAL LOW (ref 0.76–1.27)
Globulin, Total: 2.1 g/dL (ref 1.5–4.5)
Glucose: 152 mg/dL — ABNORMAL HIGH (ref 70–99)
Potassium: 4.9 mmol/L (ref 3.5–5.2)
Sodium: 144 mmol/L (ref 134–144)
Total Protein: 6.6 g/dL (ref 6.0–8.5)
eGFR: 102 mL/min/{1.73_m2} (ref >59)

## 2021-05-07 LAB — CBC WITH DIFFERENTIAL/PLATELET
Basophils Absolute: 0.1 10*3/uL (ref 0.0–0.2)
Basos: 1 %
EOS (ABSOLUTE): 0.1 10*3/uL (ref 0.0–0.4)
Eos: 3 %
Hematocrit: 47.2 % (ref 37.5–51.0)
Hemoglobin: 15.9 g/dL (ref 13.0–17.7)
Immature Grans (Abs): 0 10*3/uL (ref 0.0–0.1)
Immature Granulocytes: 1 %
Lymphocytes Absolute: 0.9 10*3/uL (ref 0.7–3.1)
Lymphs: 16 %
MCH: 30.1 pg (ref 26.6–33.0)
MCHC: 33.7 g/dL (ref 31.5–35.7)
MCV: 89 fL (ref 79–97)
Monocytes Absolute: 0.2 10*3/uL (ref 0.1–0.9)
Monocytes: 4 %
Neutrophils Absolute: 4.1 10*3/uL (ref 1.4–7.0)
Neutrophils: 75 %
Platelets: 228 10*3/uL (ref 150–450)
RBC: 5.28 x10E6/uL (ref 4.14–5.80)
RDW: 14.6 % (ref 11.6–15.4)
WBC: 5.4 10*3/uL (ref 3.4–10.8)

## 2021-05-07 LAB — LIPID PANEL
Chol/HDL Ratio: 4.6 ratio (ref 0.0–5.0)
Cholesterol, Total: 173 mg/dL (ref 100–199)
HDL: 38 mg/dL — ABNORMAL LOW (ref >39)
LDL Chol Calc (NIH): 80 mg/dL (ref 0–99)
Triglycerides: 342 mg/dL — ABNORMAL HIGH (ref 0–149)
VLDL Cholesterol Cal: 55 mg/dL — ABNORMAL HIGH (ref 5–40)

## 2021-05-10 ENCOUNTER — Other Ambulatory Visit: Payer: Self-pay | Admitting: Family Medicine

## 2021-05-23 DIAGNOSIS — C61 Malignant neoplasm of prostate: Secondary | ICD-10-CM | POA: Diagnosis not present

## 2021-05-23 DIAGNOSIS — R9721 Rising PSA following treatment for malignant neoplasm of prostate: Secondary | ICD-10-CM | POA: Diagnosis not present

## 2021-08-16 ENCOUNTER — Encounter: Payer: Self-pay | Admitting: Family Medicine

## 2021-08-16 ENCOUNTER — Ambulatory Visit (INDEPENDENT_AMBULATORY_CARE_PROVIDER_SITE_OTHER): Payer: Medicare Other | Admitting: Family Medicine

## 2021-08-16 VITALS — BP 137/81 | HR 72 | Wt 135.0 lb

## 2021-08-16 DIAGNOSIS — E785 Hyperlipidemia, unspecified: Secondary | ICD-10-CM | POA: Diagnosis not present

## 2021-08-16 DIAGNOSIS — Z23 Encounter for immunization: Secondary | ICD-10-CM | POA: Diagnosis not present

## 2021-08-16 DIAGNOSIS — I152 Hypertension secondary to endocrine disorders: Secondary | ICD-10-CM | POA: Diagnosis not present

## 2021-08-16 DIAGNOSIS — E1169 Type 2 diabetes mellitus with other specified complication: Secondary | ICD-10-CM | POA: Diagnosis not present

## 2021-08-16 DIAGNOSIS — E1159 Type 2 diabetes mellitus with other circulatory complications: Secondary | ICD-10-CM | POA: Diagnosis not present

## 2021-08-16 LAB — BAYER DCA HB A1C WAIVED: HB A1C (BAYER DCA - WAIVED): 6.1 % — ABNORMAL HIGH (ref 4.8–5.6)

## 2021-08-16 NOTE — Progress Notes (Signed)
? ?BP 137/81   Pulse 72   Wt 135 lb (61.2 kg)   SpO2 97%   BMI 23.91 kg/m?   ? ?Subjective:  ? ?Patient ID: Chase [name], male    DOB: Dec 06, 1950, 71 y.o.   MRN: [id number] ? ?HPI: ?Chase [name] is a 71 y.o. male presenting on 08/16/2021 for Medical Management of Chronic Issues and Diabetes ? ? ?HPI ?Type 2 diabetes mellitus ?Patient comes in today for recheck of his diabetes. Patient has been currently taking metformin and Jardiance. Patient is currently on an ACE inhibitor/ARB. Patient has not seen an ophthalmologist this year. Patient denies any issues with their feet. The symptom started onset as an adult hypertension and hyperlipidemia ARE RELATED TO DM  ? ?Hypertension ?Patient is currently on lisinopril, and their blood pressure today is 137/81. Patient denies any lightheadedness or dizziness. Patient denies headaches, blurred vision, chest pains, shortness of breath, or weakness. Denies any side effects from medication and is content with current medication.  ? ?Hyperlipidemia ?Patient is coming in for recheck of his hyperlipidemia. The patient is currently taking atorvastatin. They deny any issues with myalgias or history of liver damage from it. They deny any focal numbness or weakness or chest pain.  ? ?Relevant past medical, surgical, family and social history reviewed and updated as indicated. Interim medical history since our last visit reviewed. ?Allergies and medications reviewed and updated. ? ?Review of Systems  ?Constitutional:  Negative for chills and fever.  ?Eyes:  Negative for visual disturbance.  ?Respiratory:  Negative for shortness of breath and wheezing.   ?Cardiovascular:  Negative for chest pain and leg swelling.  ?Gastrointestinal:  Positive for constipation.  ?Musculoskeletal:  Negative for back pain and gait problem.  ?Skin:  Negative for rash.  ?Neurological:  Negative for dizziness, weakness and light-headedness.  ?All other systems reviewed and are negative. ? ?Per HPI  unless specifically indicated above ? ? ?Allergies as of 08/16/2021   ?No Known Allergies ?  ? ?  ?Medication List  ?  ? ?  ? Accurate as of August 16, 2021  3:49 PM. If you have any questions, ask your nurse or doctor.  ?  ?  ? ?  ? ?STOP taking these medications   ? ?amitriptyline 25 MG tablet ?Commonly known as: ELAVIL ?Stopped by: Worthy Rancher, MD ?  ?DULoxetine 30 MG capsule ?Commonly known as: CYMBALTA ?Stopped by: Worthy Rancher, MD ?  ?hydrOXYzine 25 MG capsule ?Commonly known as: VISTARIL ?Stopped by: Worthy Rancher, MD ?  ? ?  ? ?TAKE these medications   ? ?atorvastatin 40 MG tablet ?Commonly known as: LIPITOR ?Take 1 tablet (40 mg total) by mouth every evening. ?  ?blood glucose meter kit and supplies Kit ?Dispense based on patient and insurance preference. Use up to four times daily as directed. ?  ?empagliflozin 25 MG Tabs tablet ?Commonly known as: Jardiance ?Take 1 tablet (25 mg total) by mouth daily before breakfast. ?  ?glucose blood test strip ?Use as instructed to test blood sugar once daily.  Please fill One touch verio test strips. DX:E11.69 ?  ?lisinopril 5 MG tablet ?Commonly known as: ZESTRIL ?Take 1 tablet (5 mg total) by mouth daily. ?  ?metFORMIN 500 MG tablet ?Commonly known as: GLUCOPHAGE ?Take 2 tablets (1,000 mg total) by mouth 2 (two) times daily with a meal. ?  ?OneTouch Delica Lancets 55V Misc ?Use to check blood sugar 2 times daily. E11.69 ?  ?OneTouch Verio w/Device Kit ?Take  1 each by mouth in the morning and at bedtime. Use to test blood sugar daily ?  ?tamsulosin 0.4 MG Caps capsule ?Commonly known as: FLOMAX ?Take 1 capsule by mouth once daily ?  ? ?  ? ? ? ?Objective:  ? ?BP 137/81   Pulse 72   Wt 135 lb (61.2 kg)   SpO2 97%   BMI 23.91 kg/m?   ?Wt Readings from Last 3 Encounters:  ?08/16/21 135 lb (61.2 kg)  ?05/06/21 138 lb (62.6 kg)  ?02/01/21 135 lb (61.2 kg)  ?  ?Physical Exam ?Vitals and nursing note reviewed.  ?Constitutional:   ?   General: He is not  in acute distress. ?   Appearance: He is well-developed. He is not diaphoretic.  ?Eyes:  ?   General: No scleral icterus. ?   Conjunctiva/sclera: Conjunctivae normal.  ?Neck:  ?   Thyroid: No thyromegaly.  ?Cardiovascular:  ?   Rate and Rhythm: Normal rate and regular rhythm.  ?   Heart sounds: Normal heart sounds. No murmur heard. ?Pulmonary:  ?   Effort: Pulmonary effort is normal. No respiratory distress.  ?   Breath sounds: Normal breath sounds. No wheezing.  ?Abdominal:  ?   General: Abdomen is flat. Bowel sounds are normal. There is no distension.  ?   Tenderness: There is no abdominal tenderness. There is no guarding or rebound.  ?Musculoskeletal:     ?   General: Normal range of motion.  ?   Cervical back: Neck supple.  ?Lymphadenopathy:  ?   Cervical: No cervical adenopathy.  ?Skin: ?   General: Skin is warm and dry.  ?   Findings: No rash.  ?Neurological:  ?   Mental Status: He is alert and oriented to person, place, and time.  ?   Coordination: Coordination normal.  ?Psychiatric:     ?   Behavior: Behavior normal.  ? ? ? ? ?Assessment & Plan:  ? ?Problem List Items Addressed This Visit   ? ?  ? Cardiovascular and Mediastinum  ? Hypertension associated with diabetes ([place])  ?  ? Endocrine  ? Type 2 diabetes mellitus with other specified complication ([place]) - Primary  ? Relevant Orders  ? Bayer DCA Hb A1c Waived (Completed)  ? Hyperlipidemia associated with type 2 diabetes mellitus ([place])  ? ?Other Visit Diagnoses   ? ? Need for shingles vaccine      ? Relevant Orders  ? Varicella-zoster vaccine IM (Shingrix) (Completed)  ? ?  ?  ?Recommend MiraLAX daily, Zofran as needed ? ?A1c looks good at 6.1.  No change in medication. ?Follow up plan: ?Return in about 3 months (around 11/16/2021), or if symptoms worsen or fail to improve, for Diabetes hypertension . ? ?Counseling provided for all of the vaccine components ?Orders Placed This Encounter  ?Procedures  ? Varicella-zoster vaccine IM (Shingrix)  ? Bayer DCA Hb  A1c Waived  ? ? ?Caryl Pina, MD ?[place] ?08/16/2021, 3:50 PM ? ? ?  ?

## 2021-08-19 DIAGNOSIS — N5201 Erectile dysfunction due to arterial insufficiency: Secondary | ICD-10-CM | POA: Diagnosis not present

## 2021-08-19 DIAGNOSIS — C61 Malignant neoplasm of prostate: Secondary | ICD-10-CM | POA: Diagnosis not present

## 2021-08-23 ENCOUNTER — Telehealth: Payer: Self-pay | Admitting: Family Medicine

## 2021-08-23 NOTE — Telephone Encounter (Signed)
Spoke with pt. Informed that [facility] should have the medication for him. We sent in a year supply Sept of 2022. ?

## 2021-08-23 NOTE — Telephone Encounter (Signed)
?  Prescription Request ? ?08/23/2021 ? ?Is this a "Controlled Substance" medicine? no ? ?Have you seen your PCP in the last 2 weeks? yes ? ?If YES, route message to pool  -  If NO, patient needs to be scheduled for appointment. ? ?What is the name of the medication or equipment? atorvastatin (LIPITOR) 40 MG tablet  ? ?Have you contacted your pharmacy to request a refill? yes  ? ?Which pharmacy would you like this sent to? [facility] [place] ? ? ?Patient notified that their request is being sent to the clinical staff for review and that they should receive a response within 2 business days.  ? ? ?

## 2021-08-28 ENCOUNTER — Ambulatory Visit: Payer: Medicare Other

## 2021-11-13 DIAGNOSIS — H524 Presbyopia: Secondary | ICD-10-CM | POA: Diagnosis not present

## 2021-11-13 DIAGNOSIS — Z0100 Encounter for examination of eyes and vision without abnormal findings: Secondary | ICD-10-CM | POA: Diagnosis not present

## 2021-11-18 ENCOUNTER — Encounter: Payer: Self-pay | Admitting: Family Medicine

## 2021-11-18 ENCOUNTER — Ambulatory Visit (INDEPENDENT_AMBULATORY_CARE_PROVIDER_SITE_OTHER): Payer: Medicare HMO | Admitting: Family Medicine

## 2021-11-18 ENCOUNTER — Other Ambulatory Visit: Payer: Self-pay | Admitting: Family Medicine

## 2021-11-18 VITALS — BP 124/83 | HR 64 | Temp 97.2°F | Ht 63.0 in | Wt 137.0 lb

## 2021-11-18 DIAGNOSIS — R1031 Right lower quadrant pain: Secondary | ICD-10-CM

## 2021-11-18 DIAGNOSIS — E785 Hyperlipidemia, unspecified: Secondary | ICD-10-CM

## 2021-11-18 DIAGNOSIS — E1159 Type 2 diabetes mellitus with other circulatory complications: Secondary | ICD-10-CM

## 2021-11-18 DIAGNOSIS — I152 Hypertension secondary to endocrine disorders: Secondary | ICD-10-CM

## 2021-11-18 DIAGNOSIS — C61 Malignant neoplasm of prostate: Secondary | ICD-10-CM

## 2021-11-18 DIAGNOSIS — K5909 Other constipation: Secondary | ICD-10-CM

## 2021-11-18 DIAGNOSIS — E1169 Type 2 diabetes mellitus with other specified complication: Secondary | ICD-10-CM

## 2021-11-18 LAB — BAYER DCA HB A1C WAIVED: HB A1C (BAYER DCA - WAIVED): 6.2 % — ABNORMAL HIGH (ref 4.8–5.6)

## 2021-11-18 MED ORDER — POLYETHYLENE GLYCOL 3350 17 GM/SCOOP PO POWD: 17.0000 g | Freq: Two times a day (BID) | ORAL | 1 refills | Status: DC | PRN

## 2021-11-18 MED ORDER — TAMSULOSIN HCL 0.4 MG PO CAPS: 0.4000 mg | ORAL_CAPSULE | Freq: Every day | ORAL | 3 refills | Status: DC

## 2021-11-18 MED ORDER — METFORMIN HCL 500 MG PO TABS: 1000.0000 mg | ORAL_TABLET | Freq: Two times a day (BID) | ORAL | 3 refills | Status: DC

## 2021-11-18 MED ORDER — LISINOPRIL 5 MG PO TABS: 5.0000 mg | ORAL_TABLET | Freq: Every day | ORAL | 3 refills | Status: DC

## 2021-11-18 MED ORDER — ATORVASTATIN CALCIUM 40 MG PO TABS: 40.0000 mg | ORAL_TABLET | Freq: Every evening | ORAL | 3 refills | Status: DC

## 2021-11-18 MED ORDER — EMPAGLIFLOZIN 25 MG PO TABS: 25.0000 mg | ORAL_TABLET | Freq: Every day | ORAL | 3 refills | Status: DC

## 2021-11-18 NOTE — Progress Notes (Signed)
BP 124/83   Pulse 64   Temp (!) 97.2 F (36.2 C)   Ht _0  (1.6 m)   Wt 137 lb (62.1 kg)   SpO2 98%   BMI 24.27 kg/m    Subjective:   Patient ID: Chase [name], male    DOB: [birth year], 71 y.o.   MRN: [id number]  HPI: Chase [name] is a 71 y.o. male presenting on 11/18/2021 for Medical Management of Chronic Issues, Hyperlipidemia, Hypertension, and Diabetes   HPI Type 2 diabetes mellitus Patient comes in today for recheck of his diabetes. Patient has been currently taking Jardiance and metformin. Patient is currently on an ACE inhibitor/ARB. Patient has not seen an ophthalmologist this year. Patient denies any issues with their feet. The symptom started onset as an adult hypertension and hyperlipidemia ARE RELATED TO DM   Hypertension Patient is currently on lisinopril, and their blood pressure today is 124/83. Patient denies any lightheadedness or dizziness. Patient denies headaches, blurred vision, chest pains, shortness of breath, or weakness. Denies any side effects from medication and is content with current medication.   Hyperlipidemia Patient is coming in for recheck of his hyperlipidemia. The patient is currently taking atorvastatin. They deny any issues with myalgias or history of liver damage from it. They deny any focal numbness or weakness or chest pain.   Relevant past medical, surgical, family and social history reviewed and updated as indicated. Interim medical history since our last visit reviewed. Allergies and medications reviewed and updated.  Review of Systems  Constitutional:  Negative for chills and fever.  Eyes:  Negative for visual disturbance.  Respiratory:  Negative for shortness of breath and wheezing.   Cardiovascular:  Negative for chest pain and leg swelling.  Gastrointestinal:  Positive for abdominal pain and constipation. Negative for diarrhea, nausea and vomiting.  Musculoskeletal:  Negative for back pain and gait problem.  Skin:   Negative for rash.  Neurological:  Negative for dizziness, weakness and light-headedness.  All other systems reviewed and are negative.   Per HPI unless specifically indicated above   Allergies as of 11/18/2021   No Known Allergies      Medication List        Accurate as of November 18, 2021  1:52 PM. If you have any questions, ask your nurse or doctor.          atorvastatin 40 MG tablet Commonly known as: LIPITOR Take 1 tablet (40 mg total) by mouth every evening.   blood glucose meter kit and supplies Kit Dispense based on patient and insurance preference. Use up to four times daily as directed.   empagliflozin 25 MG Tabs tablet Commonly known as: Jardiance Take 1 tablet (25 mg total) by mouth daily before breakfast.   glucose blood test strip Use as instructed to test blood sugar once daily.  Please fill One touch verio test strips. DX:E11.69   lisinopril 5 MG tablet Commonly known as: ZESTRIL Take 1 tablet (5 mg total) by mouth daily.   metFORMIN 500 MG tablet Commonly known as: GLUCOPHAGE Take 2 tablets (1,000 mg total) by mouth 2 (two) times daily with a meal.   OneTouch Delica Lancets 93T Misc Use to check blood sugar 2 times daily. E11.69   OneTouch Verio w/Device Kit Take 1 each by mouth in the morning and at bedtime. Use to test blood sugar daily   polyethylene glycol powder 17 GM/SCOOP powder Commonly known as: GLYCOLAX/MIRALAX Take 17 g by mouth 2 (two) times daily as  needed. Started by: Worthy Rancher, MD   tamsulosin 0.4 MG Caps capsule Commonly known as: FLOMAX Take 1 capsule (0.4 mg total) by mouth daily.         Objective:   BP 124/83   Pulse 64   Temp (!) 97.2 F (36.2 C)   Ht _0  (1.6 m)   Wt 137 lb (62.1 kg)   SpO2 98%   BMI 24.27 kg/m   Wt Readings from Last 3 Encounters:  11/18/21 137 lb (62.1 kg)  08/16/21 135 lb (61.2 kg)  05/06/21 138 lb (62.6 kg)    Physical Exam Vitals and nursing note reviewed.   Constitutional:      General: He is not in acute distress.    Appearance: He is well-developed. He is not diaphoretic.  Eyes:     General: No scleral icterus.    Conjunctiva/sclera: Conjunctivae normal.  Neck:     Thyroid: No thyromegaly.  Cardiovascular:     Rate and Rhythm: Normal rate and regular rhythm.     Heart sounds: Normal heart sounds. No murmur heard. Pulmonary:     Effort: Pulmonary effort is normal. No respiratory distress.     Breath sounds: Normal breath sounds. No wheezing.  Abdominal:     General: Abdomen is flat. Bowel sounds are normal. There is no distension.     Tenderness: There is abdominal tenderness in the right lower quadrant. There is no right CVA tenderness, left CVA tenderness, guarding or rebound. Negative signs include Murphy's sign, Rovsing's sign and McBurney's sign.  Musculoskeletal:        General: Normal range of motion.     Cervical back: Neck supple.  Lymphadenopathy:     Cervical: No cervical adenopathy.  Skin:    General: Skin is warm and dry.     Findings: No rash.  Neurological:     Mental Status: He is alert and oriented to person, place, and time.     Coordination: Coordination normal.  Psychiatric:        Behavior: Behavior normal.       Assessment & Plan:   Problem List Items Addressed This Visit       Cardiovascular and Mediastinum   Hypertension associated with diabetes ([place])   Relevant Medications   lisinopril (ZESTRIL) 5 MG tablet   metFORMIN (GLUCOPHAGE) 500 MG tablet   empagliflozin (JARDIANCE) 25 MG TABS tablet   atorvastatin (LIPITOR) 40 MG tablet   Other Relevant Orders   CBC with Differential/Platelet   CMP14+EGFR   Lipid panel   Bayer DCA Hb A1c Waived     Endocrine   Type 2 diabetes mellitus with other specified complication (HCC) - Primary   Relevant Medications   lisinopril (ZESTRIL) 5 MG tablet   metFORMIN (GLUCOPHAGE) 500 MG tablet   empagliflozin (JARDIANCE) 25 MG TABS tablet   atorvastatin  (LIPITOR) 40 MG tablet   Other Relevant Orders   CBC with Differential/Platelet   CMP14+EGFR   Lipid panel   Bayer DCA Hb A1c Waived   Hyperlipidemia associated with type 2 diabetes mellitus (HCC)   Relevant Medications   lisinopril (ZESTRIL) 5 MG tablet   metFORMIN (GLUCOPHAGE) 500 MG tablet   empagliflozin (JARDIANCE) 25 MG TABS tablet   atorvastatin (LIPITOR) 40 MG tablet   Other Relevant Orders   CBC with Differential/Platelet   CMP14+EGFR   Lipid panel   Bayer DCA Hb A1c Waived     Genitourinary   Prostate cancer (HCC)   Relevant Medications  tamsulosin (FLOMAX) 0.4 MG CAPS capsule   Other Visit Diagnoses     Chronic constipation       Relevant Medications   polyethylene glycol powder (GLYCOLAX/MIRALAX) 17 GM/SCOOP powder   Right lower quadrant abdominal pain       Relevant Medications   polyethylene glycol powder (GLYCOLAX/MIRALAX) 17 GM/SCOOP powder       Unlikely to be related to appendices because has been going on for at least 3 months or longer.  He does also admit to constipation so it is more likely related to adhesions attached to the bowels causing his issues.  He is using MiraLAX every day already.  We will increase the MiraLAX to twice a day and if does not work then may try medicine such as [place].  Patient had a colonoscopy 3 years ago in 2020 so unlikely bowel obstruction due to carcinoma.  A1c 6.2 looks good, no change from diabetes.  Blood pressure good.  No change Follow up plan: Return in about 3 months (around 02/18/2022), or if symptoms worsen or fail to improve, for Diabetes and hypertension and cholesterol.  Counseling provided for all of the vaccine components Orders Placed This Encounter  Procedures   CBC with Differential/Platelet   CMP14+EGFR   Lipid panel   Bayer DCA Hb A1c [place], MD [facility] 11/18/2021, 1:52 PM

## 2021-11-19 LAB — CMP14+EGFR
ALT: 18 IU/L (ref 0–44)
AST: 11 IU/L (ref 0–40)
Albumin/Globulin Ratio: 2.1 (ref 1.2–2.2)
Albumin: 4.6 g/dL (ref 3.8–4.8)
Alkaline Phosphatase: 62 IU/L (ref 44–121)
BUN/Creatinine Ratio: 28 — ABNORMAL HIGH (ref 10–24)
BUN: 18 mg/dL (ref 8–27)
Bilirubin Total: 0.3 mg/dL (ref 0.0–1.2)
CO2: 22 mmol/L (ref 20–29)
Calcium: 9.2 mg/dL (ref 8.6–10.2)
Chloride: 99 mmol/L (ref 96–106)
Creatinine, Ser: 0.65 mg/dL — ABNORMAL LOW (ref 0.76–1.27)
Globulin, Total: 2.2 g/dL (ref 1.5–4.5)
Glucose: 146 mg/dL — ABNORMAL HIGH (ref 70–99)
Potassium: 4.6 mmol/L (ref 3.5–5.2)
Sodium: 137 mmol/L (ref 134–144)
Total Protein: 6.8 g/dL (ref 6.0–8.5)
eGFR: 102 mL/min/{1.73_m2} (ref >59)

## 2021-11-19 LAB — CBC WITH DIFFERENTIAL/PLATELET
Basophils Absolute: 0.1 10*3/uL (ref 0.0–0.2)
Basos: 1 %
EOS (ABSOLUTE): 0.2 10*3/uL (ref 0.0–0.4)
Eos: 5 %
Hematocrit: 45.2 % (ref 37.5–51.0)
Hemoglobin: 15.5 g/dL (ref 13.0–17.7)
Immature Grans (Abs): 0.1 10*3/uL (ref 0.0–0.1)
Immature Granulocytes: 2 %
Lymphocytes Absolute: 1 10*3/uL (ref 0.7–3.1)
Lymphs: 21 %
MCH: 30.6 pg (ref 26.6–33.0)
MCHC: 34.3 g/dL (ref 31.5–35.7)
MCV: 89 fL (ref 79–97)
Monocytes Absolute: 0.5 10*3/uL (ref 0.1–0.9)
Monocytes: 10 %
Neutrophils Absolute: 2.9 10*3/uL (ref 1.4–7.0)
Neutrophils: 61 %
Platelets: 199 10*3/uL (ref 150–450)
RBC: 5.06 x10E6/uL (ref 4.14–5.80)
RDW: 14.2 % (ref 11.6–15.4)
WBC: 4.7 10*3/uL (ref 3.4–10.8)

## 2021-11-19 LAB — LIPID PANEL
Chol/HDL Ratio: 3.9 ratio (ref 0.0–5.0)
Cholesterol, Total: 147 mg/dL (ref 100–199)
HDL: 38 mg/dL — ABNORMAL LOW (ref >39)
LDL Chol Calc (NIH): 78 mg/dL (ref 0–99)
Triglycerides: 182 mg/dL — ABNORMAL HIGH (ref 0–149)
VLDL Cholesterol Cal: 31 mg/dL (ref 5–40)

## 2021-12-04 ENCOUNTER — Ambulatory Visit (INDEPENDENT_AMBULATORY_CARE_PROVIDER_SITE_OTHER): Payer: Medicare HMO

## 2021-12-04 VITALS — Wt 137.0 lb

## 2021-12-04 DIAGNOSIS — Z0000 Encounter for general adult medical examination without abnormal findings: Secondary | ICD-10-CM | POA: Diagnosis not present

## 2021-12-04 NOTE — Patient Instructions (Addendum)
[name] , Thank you for taking time to come for your Medicare Wellness Visit. I appreciate your ongoing commitment to your health goals. Please review the following plan we discussed and let me know if I can assist you in the future.   Screening recommendations/referrals: Colonoscopy: Done 03/22/2019 - Repeat in 3 years *this is coming up soon Recommended yearly ophthalmology/optometry visit for glaucoma screening and checkup Recommended yearly dental visit for hygiene and checkup  Vaccinations: Influenza vaccine: Done 02/04/2021 - Repeat annually  Pneumococcal vaccine: Done 03/30/19 & 10/25/2019  Tdap vaccine: Done 03/29/2018 - Repeat in 10 years Shingles vaccine: Done 11/08/2020 & 08/16/2021   Covid-19: Done 07/27/2019, 08/24/2019, & 05/01/2020  Advanced directives: Advance directive discussed with you today. Even though you declined this today, please call our office should you change your mind, and we can give you the proper paperwork for you to fill out.   Conditions/risks identified: Aim for 4-6 glasses of water, plenty of protein in your diet and try to get up and walk/ stretch every hour for 5-10 minutes at a time. Try balance and strength exercises to help strengthen your legs to prevent falls.  Next appointment: Follow up in one year for your annual wellness visit.   Preventive Care 77 Years and Older, Male  Preventive care refers to lifestyle choices and visits with your health care provider that can promote health and wellness. What does preventive care include? A yearly physical exam. This is also called an annual well check. Dental exams once or twice a year. Routine eye exams. Ask your health care provider how often you should have your eyes checked. Personal lifestyle choices, including: Daily care of your teeth and gums. Regular physical activity. Eating a healthy diet. Avoiding tobacco and drug use. Limiting alcohol use. Practicing safe sex. Taking low doses of  aspirin every day. Taking vitamin and mineral supplements as recommended by your health care provider. What happens during an annual well check? The services and screenings done by your health care provider during your annual well check will depend on your age, overall health, lifestyle risk factors, and family history of disease. Counseling  Your health care provider may ask you questions about your: Alcohol use. Tobacco use. Drug use. Emotional well-being. Home and relationship well-being. Sexual activity. Eating habits. History of falls. Memory and ability to understand (cognition). Work and work Statistician. Screening  You may have the following tests or measurements: Height, weight, and BMI. Blood pressure. Lipid and cholesterol levels. These may be checked every 5 years, or more frequently if you are over 9 years old. Skin check. Lung cancer screening. You may have this screening every year starting at age 29 if you have a 30-pack-year history of smoking and currently smoke or have quit within the past 15 years. Fecal occult blood test (FOBT) of the stool. You may have this test every year starting at age 61. Flexible sigmoidoscopy or colonoscopy. You may have a sigmoidoscopy every 5 years or a colonoscopy every 10 years starting at age 58. Prostate cancer screening. Recommendations will vary depending on your family history and other risks. Hepatitis C blood test. Hepatitis B blood test. Sexually transmitted disease (STD) testing. Diabetes screening. This is done by checking your blood sugar (glucose) after you have not eaten for a while (fasting). You may have this done every 1-3 years. Abdominal aortic aneurysm (AAA) screening. You may need this if you are a current or former smoker. Osteoporosis. You may be screened starting  at age 57 if you are at high risk. Talk with your health care provider about your test results, treatment options, and if necessary, the need for more  tests. Vaccines  Your health care provider may recommend certain vaccines, such as: Influenza vaccine. This is recommended every year. Tetanus, diphtheria, and acellular pertussis (Tdap, Td) vaccine. You may need a Td booster every 10 years. Zoster vaccine. You may need this after age 35. Pneumococcal 13-valent conjugate (PCV13) vaccine. One dose is recommended after age 35. Pneumococcal polysaccharide (PPSV23) vaccine. One dose is recommended after age 67. Talk to your health care provider about which screenings and vaccines you need and how often you need them. This information is not intended to replace advice given to you by your health care provider. Make sure you discuss any questions you have with your health care provider. Document Released: 06/15/2015 Document Revised: 02/06/2016 Document Reviewed: 03/20/2015 Elsevier Interactive Patient Education  2017 [place] Prevention in the Home Falls can cause injuries. They can happen to people of all ages. There are many things you can do to make your home safe and to help prevent falls. What can I do on the outside of my home? Regularly fix the edges of walkways and driveways and fix any cracks. Remove anything that might make you trip as you walk through a door, such as a raised step or threshold. Trim any bushes or trees on the path to your home. Use bright outdoor lighting. Clear any walking paths of anything that might make someone trip, such as rocks or tools. Regularly check to see if handrails are loose or broken. Make sure that both sides of any steps have handrails. Any raised decks and porches should have guardrails on the edges. Have any leaves, snow, or ice cleared regularly. Use sand or salt on walking paths during winter. Clean up any spills in your garage right away. This includes oil or grease spills. What can I do in the bathroom? Use night lights. Install grab bars by the toilet and in the tub and shower.  Do not use towel bars as grab bars. Use non-skid mats or decals in the tub or shower. If you need to sit down in the shower, use a plastic, non-slip stool. Keep the floor dry. Clean up any water that spills on the floor as soon as it happens. Remove soap buildup in the tub or shower regularly. Attach bath mats securely with double-sided non-slip rug tape. Do not have throw rugs and other things on the floor that can make you trip. What can I do in the bedroom? Use night lights. Make sure that you have a light by your bed that is easy to reach. Do not use any sheets or blankets that are too big for your bed. They should not hang down onto the floor. Have a firm chair that has side arms. You can use this for support while you get dressed. Do not have throw rugs and other things on the floor that can make you trip. What can I do in the kitchen? Clean up any spills right away. Avoid walking on wet floors. Keep items that you use a lot in easy-to-reach places. If you need to reach something above you, use a strong step stool that has a grab bar. Keep electrical cords out of the way. Do not use floor polish or wax that makes floors slippery. If you must use wax, use non-skid floor wax. Do not have throw rugs  and other things on the floor that can make you trip. What can I do with my stairs? Do not leave any items on the stairs. Make sure that there are handrails on both sides of the stairs and use them. Fix handrails that are broken or loose. Make sure that handrails are as long as the stairways. Check any carpeting to make sure that it is firmly attached to the stairs. Fix any carpet that is loose or worn. Avoid having throw rugs at the top or bottom of the stairs. If you do have throw rugs, attach them to the floor with carpet tape. Make sure that you have a light switch at the top of the stairs and the bottom of the stairs. If you do not have them, ask someone to add them for you. What else  can I do to help prevent falls? Wear shoes that: Do not have high heels. Have rubber bottoms. Are comfortable and fit you well. Are closed at the toe. Do not wear sandals. If you use a stepladder: Make sure that it is fully opened. Do not climb a closed stepladder. Make sure that both sides of the stepladder are locked into place. Ask someone to hold it for you, if possible. Clearly mark and make sure that you can see: Any grab bars or handrails. First and last steps. Where the edge of each step is. Use tools that help you move around (mobility aids) if they are needed. These include: Canes. Walkers. Scooters. Crutches. Turn on the lights when you go into a dark area. Replace any light bulbs as soon as they burn out. Set up your furniture so you have a clear path. Avoid moving your furniture around. If any of your floors are uneven, fix them. If there are any pets around you, be aware of where they are. Review your medicines with your doctor. Some medicines can make you feel dizzy. This can increase your chance of falling. Ask your doctor what other things that you can do to help prevent falls. This information is not intended to replace advice given to you by your health care provider. Make sure you discuss any questions you have with your health care provider. Document Released: 03/15/2009 Document Revised: 10/25/2015 Document Reviewed: 06/23/2014 Elsevier Interactive Patient Education  2017 [employer].

## 2021-12-04 NOTE — Progress Notes (Signed)
Subjective:   Chase [name] is a 70 y.o. male who presents for Medicare Annual/Subsequent preventive examination.  Virtual Visit via Telephone Note  I connected with  Chase [name] on 12/04/21 at  8:15 AM EDT by telephone and verified that I am speaking with the correct person using two identifiers.  Location: Patient: Home Provider: [facility] Persons participating in the virtual visit: patient, [place]   I discussed the limitations, risks, security and privacy concerns of performing an evaluation and management service by telephone and the availability of in person appointments. The patient expressed understanding and agreed to proceed.  Interactive audio and video telecommunications were attempted between this nurse and patient, however failed, due to patient having technical difficulties OR patient did not have access to video capability.  We continued and completed visit with audio only.  Some vital signs may be absent or patient reported.   Dalana Pfahler E Mikka Kissner, LPN   Review of Systems     Cardiac Risk Factors include: advanced age (>30mn, >>50women);diabetes mellitus;dyslipidemia;hypertension;male gender;sedentary lifestyle     Objective:    Today's Vitals   12/04/21 0822 12/04/21 0823  Weight: 137 lb (62.1 kg)   PainSc:  7    Body mass index is 24.27 kg/m.     12/04/2021    8:52 AM 01/28/2021    8:40 AM 11/29/2020    7:58 AM 09/17/2020    8:55 AM 08/27/2020   10:24 AM 01/30/2020   10:54 AM 09/20/2019   11:08 AM  Advanced Directives  Does Patient Have a Medical Advance Directive? No Yes _0   Would patient like information on creating a medical advance directive? No - Patient declined   No - Patient declined No - Patient declined      Current Medications (verified) Outpatient Encounter Medications as of 12/04/2021  Medication Sig   atorvastatin (LIPITOR) 40 MG tablet Take 1 tablet (40 mg total) by mouth every  evening.   empagliflozin (JARDIANCE) 25 MG TABS tablet Take 1 tablet (25 mg total) by mouth daily before breakfast.   lisinopril (ZESTRIL) 5 MG tablet Take 1 tablet (5 mg total) by mouth daily.   metFORMIN (GLUCOPHAGE) 500 MG tablet Take 2 tablets (1,000 mg total) by mouth 2 (two) times daily with a meal.   tamsulosin (FLOMAX) 0.4 MG CAPS capsule Take 1 capsule (0.4 mg total) by mouth daily.   blood glucose meter kit and supplies KIT Dispense based on patient and insurance preference. Use up to four times daily as directed.   Blood Glucose Monitoring Suppl (ONETOUCH VERIO) w/Device KIT Take 1 each by mouth in the morning and at bedtime. Use to test blood sugar daily   glucose blood test strip Use as instructed to test blood sugar once daily.  Please fill One touch verio test strips. DSE:G31.51  OneTouch Delica Lancets 376HMISC Use to check blood sugar 2 times daily. E11.69   polyethylene glycol powder (GLYCOLAX/MIRALAX) 17 GM/SCOOP powder Take 17 g by mouth 2 (two) times daily as needed. (Patient not taking: Reported on 12/04/2021)   tadalafil (CIALIS) 20 MG tablet Take 20 mg by mouth daily as needed. (Patient not taking: Reported on 12/04/2021)   No facility-administered encounter medications on file as of 12/04/2021.    Allergies (verified) Patient has no known allergies.   History: Past Medical History:  Diagnosis Date   Arthritis    Cancer ([facility]    prostate 2017   Depression  Diabetes mellitus without complication (HCC)    Headache    High cholesterol    Hypertension    Inguinal hernia    rt   Neck pain    Numbness and tingling in hands    Urinary frequency    Past Surgical History:  Procedure Laterality Date   ANTERIOR CERVICAL DECOMP/DISCECTOMY FUSION N/A 04/04/2015   Procedure: ANTERIOR CERVICAL DECOMPRESSION/DISCECTOMY FUSION PLATING BONEGRAFT CERVICAL FOUR-FIVE CERVICAL FIVE-SIX;  Surgeon: Ashok Pall, MD;  Location: [facility];  Service: Neurosurgery;  Laterality: N/A;    BACK SURGERY     lower back with Dr. Christella Noa   COLONOSCOPY WITH PROPOFOL N/A 03/22/2019   Procedure: COLONOSCOPY WITH PROPOFOL;  Surgeon: Danie Binder, MD;  Location: [facility];  Service: Endoscopy;  Laterality: N/A;  8:30am   INGUINAL HERNIA REPAIR Right 12/22/2018   Procedure: HERNIA REPAIR INGUINAL ADULT WITH MESH;  Surgeon: Aviva Signs, MD;  Location: [facility];  Service: General;  Laterality: Right;   POLYPECTOMY  03/22/2019   Procedure: POLYPECTOMY;  Surgeon: Danie Binder, MD;  Location: [facility];  Service: Endoscopy;;  Cold snare polypectomy cecal, hepatic flexure polyps x 2,descending colon  polyps x 3,  and rectal polyps x 3    SHOULDER ARTHROSCOPY Right    UPPER EXTREMITY VENOGRAPHY N/A 07/22/2016   Procedure: Upper Extremity Venography - bilateral arm;  Surgeon: Serafina Mitchell, MD;  Location: [facility];  Service: Cardiovascular;  Laterality: N/A;   Family History  Problem Relation Age of Onset   Colon cancer Neg Hx    Colon polyps Neg Hx    Social History   Socioeconomic History   Marital status: Married    Spouse name: Not on file   Number of children: 48   Years of education: Not on file   Highest education level: Not on file  Occupational History   Not on file  Tobacco Use   Smoking status: Former    Packs/day: 0.25    Years: 40.00    Total pack years: 10.00    Types: Cigarettes    Quit date: 12/19/2017    Years since quitting: 3.9   Smokeless tobacco: Never   Tobacco comments:    quit 11/2017 (as of 12/08/2018)  Vaping Use   Vaping Use: Never used  Substance and Sexual Activity   Alcohol use: Not Currently    Comment: no ETOH since since 2016 (as of 12/08/2018)   Drug use: No   Sexual activity: Not Currently  Other Topics Concern   Not on file  Social History Narrative   Right handed   livs with wife in a one story   Social Determinants of Health   Financial Resource Strain: Low Risk  (12/04/2021)   Overall Financial Resource  Strain (CARDIA)    Difficulty of Paying Living Expenses: Not hard at all  Food Insecurity: No Food Insecurity (12/04/2021)   Hunger Vital Sign    Worried About Running Out of Food in the Last Year: Never true    Ran Out of Food in the Last Year: Never true  Transportation Needs: No Transportation Needs (12/04/2021)   PRAPARE - Hydrologist (Medical): No    Lack of Transportation (Non-Medical): No  Physical Activity: Insufficiently Active (12/04/2021)   Exercise Vital Sign    Days of Exercise per Week: 3 days    Minutes of Exercise per Session: 20 min  Stress: Stress Concern Present (12/04/2021)   [place]  [employer] - Occupational Stress Questionnaire    Feeling of Stress : Rather much  Social Connections: Moderately Isolated (12/04/2021)   Social Connection and Isolation Panel [NHANES]    Frequency of Communication with Friends and Family: More than three times a week    Frequency of Social Gatherings with Friends and Family: More than three times a week    Attends Religious Services: Never    Marine scientist or Organizations: No    Attends Archivist Meetings: Never    Marital Status: Married    Tobacco Counseling Counseling given: Not Answered Tobacco comments: quit 11/2017 (as of 12/08/2018)   Clinical Intake:  Pre-visit preparation completed: Yes  Pain : 0-10 Pain Score: 7  Pain Type: Acute pain Pain Location: Head Pain Orientation: Anterior Pain Radiating Towards: eyes Pain Descriptors / Indicators: Aching, Headache, Pressure, Restless Pain Onset: In the past 7 days Pain Frequency: Constant Pain Relieving Factors: sleep Effect of Pain on Daily Activities: difficulty sleeping, loss of appetite  Pain Relieving Factors: sleep  BMI - recorded: 24.27 Nutritional Status: BMI of 19-24  Normal Nutritional Risks: None Diabetes: Yes CBG done?: No Did pt. bring in CBG monitor from home?: No  How often do you need  to have someone help you when you read instructions, pamphlets, or other written materials from your doctor or pharmacy?: 1 - Never  Diabetic?Nutrition Risk Assessment:  Has the patient had any N/V/D within the last 2 months?  Yes  Does the patient have any non-healing wounds?  No  Has the patient had any unintentional weight loss or weight gain?  No   Diabetes:  Is the patient diabetic?  Yes  If diabetic, was a CBG obtained today?  No  Did the patient bring in their glucometer from home?  No  How often do you monitor your CBG's? never.   Financial Strains and Diabetes Management:  Are you having any financial strains with the device, your supplies or your medication? No .  Does the patient want to be seen by Chronic Care Management for management of their diabetes?  No  Would the patient like to be referred to a Nutritionist or for Diabetic Management?  No   Diabetic Exams:  Diabetic Eye Exam: Completed 11/11/2021.   Diabetic Foot Exam: Completed 11/18/2021. Pt has been advised about the importance in completing this exam.   Interpreter Needed?: Yes Interpreter Agency: [employer] Interpreter Name: Pryor Curia ID: [id number] Patient Declined Interpreter : No Patient signed [place] waiver: No  Information entered by :: Adalei Novell, LPN   Activities of Daily Living    12/04/2021    8:55 AM  In your present state of health, do you have any difficulty performing the following activities:  Hearing? 0  Vision? 0  Difficulty concentrating or making decisions? 1  Walking or climbing stairs? 1  Dressing or bathing? 0  Doing errands, shopping? 0  Preparing Food and eating ? N  Using the Toilet? N  In the past six months, have you accidently leaked urine? N  Do you have problems with loss of bowel control? N  Managing your Medications? N  Managing your Finances? N  Housekeeping or managing your Housekeeping? N    Patient Care Team: Dettinger, Fransisca Kaufmann, MD as  PCP - General (Family Medicine) Ashok Pall, MD as Consulting Physician (Neurosurgery) Danie Binder, MD (Inactive) as Consulting Physician (Gastroenterology) Ilean China, RN as Registered Nurse Cira Rue, RN as  Oncology Nurse Navigator Pieter Partridge, DO as Consulting Physician (Neurology) Ardis Hughs, MD as Attending Physician (Urology) Tyler Pita, MD as Consulting Physician (Radiation Oncology)  Indicate any recent Medical Services you may have received from other than [facility] providers in the past year (date may be approximate).     Assessment:   This is a routine wellness examination for [place].  Hearing/Vision screen Hearing Screening - Comments:: Denies hearing difficulties   Vision Screening - Comments:: Wears rx glasses - up to date with routine eye exams with [facility]  Dietary issues and exercise activities discussed: Current Exercise Habits: Home exercise routine, Type of exercise: walking;Other - see comments (riding bicycle), Time (Minutes): 15, Frequency (Times/Week): 3, Weekly Exercise (Minutes/Week): 45, Intensity: Mild, Exercise limited by: orthopedic condition(s)   Goals Addressed             This Visit's Progress    Patient Stated       12/04/2021 - No more falls, try to exercise and strengthen legs       Depression Screen    12/04/2021    8:45 AM 11/18/2021    1:17 PM 05/06/2021    3:32 PM 02/01/2021    3:17 PM 11/08/2020    8:23 AM 08/27/2020   10:25 AM 08/27/2020   10:21 AM  PHQ 2/9 Scores  PHQ - 2 Score _0 0  PHQ- 9 Score _1 Fall Risk    12/04/2021    8:34 AM 11/18/2021    1:13 PM 05/06/2021    3:32 PM 02/01/2021    3:17 PM 01/28/2021    8:40 AM  Fall Risk   Falls in the past year? 1 1 0 0 0  Number falls in past yr: 1 1   0  Injury with Fall? 0 0   0  Risk for fall due to : History of fall(s);Impaired balance/gait;Orthopedic patient Impaired balance/gait     Follow up Education provided;Falls prevention  discussed Falls evaluation completed       FALL RISK PREVENTION PERTAINING TO THE HOME:  Any stairs in or around the home? No  If so, are there any without handrails? No  Home free of loose throw rugs in walkways, pet beds, electrical cords, etc? Yes  Adequate lighting in your home to reduce risk of falls? Yes   ASSISTIVE DEVICES UTILIZED TO PREVENT FALLS:  Life alert? No  Use of a cane, walker or w/c? Yes  Grab bars in the bathroom? Yes  Shower chair or bench in shower? Yes  Elevated toilet seat or a handicapped toilet? Yes   TIMED UP AND GO:  Was the test performed? No . Telephonic visit  Cognitive Function:    03/29/2018    2:36 PM  MMSE - Mini Mental State Exam  Orientation to time 5  Orientation to Place 5  Registration 3  Attention/ Calculation 5  Recall 3  Language- name 2 objects 2  Language- repeat 1  Language- follow 3 step command 3  Language- read & follow direction 1  Write a sentence 1  Copy design 1  Total score 30        12/04/2021    8:56 AM  6CIT Screen  What Year? 0 points  What month? 0 points  What time? 0 points  Count back from 20 0 points  Months in reverse 0 points  Repeat phrase 10 points  Total Score  10 points    Immunizations Immunization History  Administered Date(s) Administered   Fluad Quad(high Dose 65+) 04/30/2020   Influenza-Unspecified 06/02/2018, 02/04/2021   Moderna Sars-Covid-2 Vaccination 07/27/2019, 08/24/2019   PFIZER(Purple Top)SARS-COV-2 Vaccination 05/01/2020   Pneumococcal Conjugate-13 03/29/2018   Pneumococcal Polysaccharide-23 10/25/2019   Tdap 03/29/2018   Zoster Recombinat (Shingrix) 11/08/2020, 08/16/2021    TDAP status: Up to date  Flu Vaccine status: Up to date  Pneumococcal vaccine status: Up to date  Covid-19 vaccine status: Completed vaccines  Qualifies for Shingles Vaccine? Yes   Zostavax completed Yes   Shingrix Completed?: Yes  Screening Tests Health Maintenance  Topic Date Due    COVID-19 Vaccine (4 - Booster) 02/17/2022 (Originally 06/26/2020)   Hepatitis C Screening  05/06/2022 (Originally 10/01/1968)   INFLUENZA VACCINE  12/31/2021   COLONOSCOPY (Pts 45-62yr Insurance coverage will need to be confirmed)  03/21/2022   HEMOGLOBIN A1C  05/20/2022   OPHTHALMOLOGY EXAM  11/12/2022   FOOT EXAM  11/19/2022   TETANUS/TDAP  03/29/2028   Pneumonia Vaccine 69 Years old  Completed   Zoster Vaccines- Shingrix  Completed   HPV VACCINES  Aged Out    Health Maintenance  There are no preventive care reminders to display for this patient.  Colorectal cancer screening: Type of screening: Colonoscopy. Completed 03/22/2019. Repeat every 3 years  Lung Cancer Screening: (Low Dose CT Chest recommended if Age 69-80years, 30 pack-year currently smoking OR have quit w/in 15years.) does not qualify  Additional Screening:  Hepatitis C Screening: does qualify; DUE  Vision Screening: Recommended annual ophthalmology exams for early detection of glaucoma and other disorders of the eye. Is the patient up to date with their annual eye exam?  Yes  Who is the provider or what is the name of the office in which the patient attends annual eye exams? [facility] pt is not established with a provider, would they like to be referred to a provider to establish care? No .   Dental Screening: Recommended annual dental exams for proper oral hygiene  Community Resource Referral / Chronic Care Management: CRR required this visit?  No   CCM required this visit?  No      Plan:     I have personally reviewed and noted the following in the patient's chart:   Medical and social history Use of alcohol, tobacco or illicit drugs  Current medications and supplements including opioid prescriptions. Patient is not currently taking opioid prescriptions. Functional ability and status Nutritional status Physical activity Advanced directives List of other physicians Hospitalizations,  surgeries, and ER visits in previous 12 months Vitals Screenings to include cognitive, depression, and falls Referrals and appointments  In addition, I have reviewed and discussed with patient certain preventive protocols, quality metrics, and best practice recommendations. A written personalized care plan for preventive services as well as general preventive health recommendations were provided to patient.     ASandrea Hammond LPN   [id number]  Nurse Notes: Frequent falls - per patient, due to weak legs - could possibly benefit from PT

## 2021-12-05 ENCOUNTER — Ambulatory Visit (INDEPENDENT_AMBULATORY_CARE_PROVIDER_SITE_OTHER): Payer: Medicare HMO | Admitting: Family Medicine

## 2021-12-05 ENCOUNTER — Encounter: Payer: Self-pay | Admitting: Family Medicine

## 2021-12-05 VITALS — BP 106/63 | HR 76 | Temp 97.9°F | Ht 63.0 in | Wt 132.4 lb

## 2021-12-05 DIAGNOSIS — F339 Major depressive disorder, recurrent, unspecified: Secondary | ICD-10-CM

## 2021-12-05 DIAGNOSIS — R69 Illness, unspecified: Secondary | ICD-10-CM | POA: Diagnosis not present

## 2021-12-05 DIAGNOSIS — F411 Generalized anxiety disorder: Secondary | ICD-10-CM | POA: Diagnosis not present

## 2021-12-05 MED ORDER — CITALOPRAM HYDROBROMIDE 10 MG PO TABS: 10.0000 mg | ORAL_TABLET | Freq: Every day | ORAL | 0 refills | Status: DC

## 2021-12-05 NOTE — Progress Notes (Signed)
Established Patient Office Visit  Subjective   Patient ID: Chase [name], male    DOB: 11-29-1950  Age: 71 y.o. MRN: [id number]  Chief Complaint  Patient presents with   Headache   Insomnia    Headache  Associated symptoms include insomnia.  Insomnia   Chase [name] reports increased stress recently. He also reports difficulty sleeping, headaches, decreased appetite, and that he is easily angered. He sleeps about 3-4 hours at most. This is starting to affect his relationship with his family. He finds that he prefers to be alone and away from others.   He used to take cymbalta but has not taken this in some time. He does not remember that it was helpful. He was taking hydroxyzine as needed and this was helpful for his anxiety. He reports that his anxiety fairly well controlled lately.   Interpreter Chase [name] [id number] used for entirety of visit.      12/05/2021   10:36 AM 12/04/2021    8:45 AM 11/18/2021    1:17 PM  Depression screen PHQ 2/9  Decreased Interest '3 2 2  '$ Down, Depressed, Hopeless '2 2 3  '$ PHQ - 2 Score '5 4 5  '$ Altered sleeping '3 1 1  '$ Tired, decreased energy 3 0 0  Change in appetite '1 2 2  '$ Feeling bad or failure about yourself  '3 2 2  '$ Trouble concentrating 3 0 0  Moving slowly or fidgety/restless 0 2 2  Suicidal thoughts 0 0 0  PHQ-9 Score '18 11 12  '$ Difficult doing work/chores Somewhat difficult Somewhat difficult       12/05/2021   10:37 AM 11/18/2021    1:20 PM 05/06/2021    3:32 PM 02/01/2021    3:17 PM  GAD 7 : Generalized Anxiety Score  Nervous, Anxious, on Edge 2 2 0 0  Control/stop worrying 1 2 0 0  Worry too much - different things 0 3 0 0  Trouble relaxing 0 2 0 0  Restless 3 0 0 0  Easily annoyed or irritable 1 0 0 0  Afraid - awful might happen 1 0 0 0  Total GAD 7 Score 8 9 0 0  Anxiety Difficulty Somewhat difficult         Past Medical History:  Diagnosis Date   Arthritis    Cancer ([facility])    prostate 2017   Depression    Diabetes mellitus without  complication (HCC)    Headache    High cholesterol    Hypertension    Inguinal hernia    rt   Neck pain    Numbness and tingling in hands    Urinary frequency       Review of Systems  Neurological:  Positive for headaches.  Psychiatric/Behavioral:  The patient has insomnia.    As per HPI   Objective:     BP 106/63   Pulse 76   Temp 97.9 F (36.6 C) (Temporal)   Ht '5\' 3"'$  (1.6 m)   Wt 132 lb 6 oz (60 kg)   SpO2 96%   BMI 23.45 kg/m    Physical Exam Vitals and nursing note reviewed.  Constitutional:      General: He is not in acute distress.    Appearance: He is not ill-appearing, toxic-appearing or diaphoretic.  Cardiovascular:     Rate and Rhythm: Normal rate and regular rhythm.     Heart sounds: Normal heart sounds. No murmur heard.    No friction rub.  Pulmonary:  Effort: Pulmonary effort is normal. No respiratory distress.     Breath sounds: Normal breath sounds. No wheezing, rhonchi or rales.  Skin:    General: Skin is warm and dry.  Neurological:     Mental Status: He is alert and oriented to person, place, and time.  Psychiatric:        Mood and Affect: Mood normal. Mood is not anxious.        Speech: Speech normal.        Behavior: Behavior normal. Behavior is not agitated.      No results found for any visits on 12/05/21.    The 10-year ASCVD risk score (Arnett DK, et al., 2019) is: 25%    Assessment & Plan:   Chase [name] was seen today for headache and insomnia.  Diagnoses and all orders for this visit:  Depression, recurrent ([place]) Generalized anxiety disorder Uncontrolled. Denies SI. Start celexa. Can continue hydroxyzine prn.  -     citalopram (CELEXA) 10 MG tablet; Take 1 tablet (10 mg total) by mouth daily.  Return in about 6 weeks (around 01/16/2022) for with PCP. Sooner for new or worsening symptoms.   The patient indicates understanding of these issues and agrees with the plan.  Gwenlyn Perking, FNP

## 2021-12-05 NOTE — Patient Instructions (Signed)
Cmo sobrellevar la depresin en los adultos Managing Depression, Adult La depresin es una afeccin de salud mental que puede influir en los pensamientos, los sentimientos y [place]. Recibir el diagnstico de depresin puede traerle alivio si no saba por qu se senta o se comportaba de Camera operator. Tambin podra hacerlo sentir abrumado por la incertidumbre acerca de su futuro. Prepararse para sobrellevar los sntomas puede ayudarle a sentirse ms positivo sobre el futuro. Cmo manejar los cambios en el estilo de vida Control del estrs  El estrs es la reaccin del cuerpo ante los cambios y los acontecimientos de la vida, tanto bu[place]. El estrs puede aumentar los sentimientos de depresin. Aprender a controlar el estrs puede ayudar a disminuir los sentimientos de depresin. Pruebe algunos de los siguientes enfoques para reducir Dealer (tcnicas de reduccin del estrs): Escuche msica que le guste y que lo inspire. Intente usar una aplicacin de meditacin o tome una clase de meditacin. Desarrolle una prctica que le ayude a conectarse con su ser espiritual. Camine al [employer], rece o vaya a un lugar de culto. Respire profundo. Para hacerlo, inhale lentamente por la nariz. Haga una pausa cuando alcance el mximo de la inhalacin durante unos segundos y luego exhale lentamente, dejando que los msculos se relajen. Practique yoga para relajarse y trabajar con los msculos. Elija una tcnica para reducir el estrs que se adapte a su estilo de vida y su personalidad. Desarrollar estas tcnicas lleva tiempo y prctica. Resrvate de 5 a 15 minutos por da para hacerlas. Algunos terapeutas pueden ofrecerle capacitacin para aprenderlas. Otras cosas que puede hacer para manejar el estrs: Catering manager un registro del estrs. Conocer sus lmites y Software engineer que no cuando piensa que algo es [place]. Prestar atencin a cmo reacciona ante ciertas situaciones. Es posible que no  pueda controlar todo, pero s puede controlar su reaccin. Aadir humor a su vida viendo comedias o programas cmicos de TV. Hacerse tiempo para las actividades que disfruta y que lo Engineer, agricultural.  Medicamentos Los medicamentos, como los antidepresivos, suelen ser parte del tratamiento para la depresin. Hable con su farmacutico o mdico sobre [employer], suplementos y productos a base de hierbas que toma, sus posibles efectos secundarios, y qu medicamentos y otros productos que se pueden tomar al mismo tiempo sin correr Gaffer. Es importante que informe a su mdico si tiene efectos secundarios. [place] mdico puede sugerirle terapia familiar, terapia de pareja o terapia individual como parte del tratamiento. Cmo reconocer los [employer] persona tiene una respuesta distinta al tratamiento para la depresin. A medida que se recupere de la depresin, puede comenzar a hacer lo siguiente: Tener ms inters en hacer sus actividades. Sentirse menos desesperanzado. Tener ms energa. Comer en exceso con menos frecuencia o tener un mejor apetito. Tener mejor atencin mental. Es importante reconocer si la depresin no mejora o si empeora. Los sntomas que tuvo en el comienzo pueden volver, por ejemplo: Cansancio (fatiga) o poca energa. Comer demasiado o muy poco. Dormir demasiado o muy poco. Sentirse agotado, agitado o desesperanzado. Dificultad para concentrarse o para tomar decisiones. Dolencias fsicas que no tienen motivo. Sentirse irritable, enojado o agresivo. Si usted o sus familiares advierten que estos sntomas regresan, infrmeselo al mdico de inmediato. Siga estas instrucciones en su casa: Actividad  Intente realizar alguna forma de ejercicio todos los [place], como caminar, Catering manager, nadar o levantar pesas. Practique tcnicas de reduccin del estrs. Active la mente tomando clases o realizando  algn trabajo voluntario. Estilo de vida [place] bien y  por el [place]. No consuma ms caf, tabaco, alcohol ni otras sustancias potencialmente peligrosas. Siga una dieta saludable que incluya abundantes frutas, verduras, cereales integrales, productos lcteos descremados y protenas magras. No consuma muchos alimentos ricos en grasas slidas, azcares agregados o sal (sodio). Instrucciones generales Use los medicamentos de venta libre y los recetados solamente como se lo haya indicado el mdico. Consulting civil engineer a todas las visitas de seguimiento como se lo haya indicado el mdico. Esto es importante. Dnde encontrar 22 Hablar con [employer]  Los amigos y familiares pueden ser fuentes de 4 y [place]. Hable con amigos o familiares de confianza sobre su afeccin. Explqueles cules son sus sntomas e infrmeles que est trabajando con un mdico para tratar la depresin. Dgales a sus amigos y familiares cmo tambin pueden ser de [place]. Finanzas Busque proveedores de salud mental adecuados que se adapten a su situacin financiera. Hable con el mdico sobre las opciones para obtener descuentos en los medicamentos. Dnde buscar ms informacin Puede encontrar apoyo en su zona en: Anxiety and Depression Association of [place] [ADAA] (Asociacin de Ansiedad y Depresin de los [place]): www.adaa.org Mental Health [place] (Salud Mental de los [place]): www.mentalhealthamerica.net [employer] on Mental Illness ([place]): www.nami.org Comunquese con un mdico si: Deja de tomar los medicamentos antidepresivos y tiene alguno de estos sntomas: Nuseas. Dolor de [place]. Desvanecimiento. Escalofros y dolores corporales. Imposibilidad de dormir (insomnio). Usted o algn amigo o familiar advierten que su depresin est empeorando. Solicite ayuda de inmediato si: Piensa en lastimarse a usted mismo o a Producer, television/film/video. Si alguna vez siente que puede lastimarse a usted mismo o a Midwife, o tiene pensamientos de poner fin a su vida, busque ayuda de inmediato. Dirjase al departamento de emergencias ms cercano o: Comunquese con el servicio de emergencias de su localidad (911 en los [place]). Llame a una lnea de asistencia al suicida y atencin en crisis como National Suicide Prevention Lifeline ([place]) al 339-273-9286. Est disponible las 24 horas del da en los [place]. Enve un mensaje de texto a la lnea para casos de crisis al [phone] (en los EE[place] UU.). Resumen Si le diagnostican depresin, prepararse para sobrellevar los sntomas es una buena forma de sentirse positivo sobre su futuro. Trabaje con su mdico en un plan de tratamiento que incluya tcnicas de reduccin del estrs, medicamentos (si corresponde), terapia y hbitos saludables. Siga hablando con el mdico acerca de cmo funciona el tratamiento. Si tiene pensamientos acerca de quitarse la vida, llame a una lnea de asistencia al suicida o enve un mensaje de texto a una lnea de asistencia en crisis. Esta informacin no tiene Marine scientist el consejo del mdico. Asegrese de hacerle al mdico cualquier pregunta que tenga. Document Revised: 06/23/2019 Document Reviewed: 06/23/2019 Elsevier Patient Education  [place].

## 2021-12-23 ENCOUNTER — Ambulatory Visit: Payer: Self-pay | Admitting: *Deleted

## 2021-12-23 DIAGNOSIS — E1169 Type 2 diabetes mellitus with other specified complication: Secondary | ICD-10-CM

## 2021-12-23 NOTE — Chronic Care Management (AMB) (Signed)
  Chronic Care Management   Note  12/23/2021 Name: Chase [name] MRN: [id number] DOB: [birth year]   Patient has either met RN Care Management goals, is stable from [place] Management perspective, or has not recently engaged with the RN Care Manager. I am removing RN Care Manager from Care Team and closing [facility]. If patient is currently engaged with another CCM team member I will forward this encounter to inform them of my case closure. Patient may be eligible for re-engagement with RN Care Manager in the future if necessary and can discuss this with their PCP.  Chong Sicilian, BSN, RN-BC Embedded Chronic Care Manager [facility] Direct Dial: [phone]

## 2021-12-23 NOTE — Patient Instructions (Signed)
Cyndee Brightly  I have previously worked with you through the Chronic Care Management Program at [facility]. Due to program changes I am removing myself from your care team because you've either met our goals, your conditions are stable and no longer require care management, or we haven't engaged within the past 6 months. If you are currently active with another CCM Team Member, you will remain active with them unless they reach out to you with additional information. If you feel that you need RN Care Management services in the future, please talk with your primary care provider to discuss re-engagement with the RN Care Manager that will be assigned to [facility]. This does not affect your status as a patient at [facility].   Thank you for allowing me to participate in your your healthcare journey.  Chong Sicilian, BSN, RN-BC Embedded Chronic Care Manager [facility] / [facility] Direct Dial: [phone]

## 2022-02-10 DIAGNOSIS — C61 Malignant neoplasm of prostate: Secondary | ICD-10-CM | POA: Diagnosis not present

## 2022-02-17 DIAGNOSIS — C61 Malignant neoplasm of prostate: Secondary | ICD-10-CM | POA: Diagnosis not present

## 2022-02-17 DIAGNOSIS — N5201 Erectile dysfunction due to arterial insufficiency: Secondary | ICD-10-CM | POA: Diagnosis not present

## 2022-02-19 ENCOUNTER — Encounter: Payer: Self-pay | Admitting: Family Medicine

## 2022-02-19 ENCOUNTER — Ambulatory Visit (INDEPENDENT_AMBULATORY_CARE_PROVIDER_SITE_OTHER): Payer: Medicare HMO | Admitting: Family Medicine

## 2022-02-19 VITALS — BP 116/75 | HR 83 | Temp 97.0°F | Ht 63.0 in | Wt 139.0 lb

## 2022-02-19 DIAGNOSIS — I152 Hypertension secondary to endocrine disorders: Secondary | ICD-10-CM

## 2022-02-19 DIAGNOSIS — E1159 Type 2 diabetes mellitus with other circulatory complications: Secondary | ICD-10-CM | POA: Diagnosis not present

## 2022-02-19 DIAGNOSIS — E1169 Type 2 diabetes mellitus with other specified complication: Secondary | ICD-10-CM

## 2022-02-19 DIAGNOSIS — Z23 Encounter for immunization: Secondary | ICD-10-CM

## 2022-02-19 DIAGNOSIS — E785 Hyperlipidemia, unspecified: Secondary | ICD-10-CM

## 2022-02-19 LAB — BAYER DCA HB A1C WAIVED: HB A1C (BAYER DCA - WAIVED): 6.3 % — ABNORMAL HIGH (ref 4.8–5.6)

## 2022-02-19 NOTE — Progress Notes (Signed)
BP 116/75   Pulse 83   Temp (!) 97 F (36.1 C)   Ht _0  (1.6 m)   Wt 139 lb (63 kg)   SpO2 95%   BMI 24.62 kg/m    Subjective:   Patient ID: Chase [name], male    DOB: 07-09-1950, 71 y.o.   MRN: [id number]  HPI: Chase [name] is a 71 y.o. male presenting on 02/19/2022 for Medical Management of Chronic Issues and Diabetes   HPI Type 2 diabetes mellitus Patient comes in today for recheck of his diabetes. Patient has been currently taking Jardiance and metformin. Patient is currently on an ACE inhibitor/ARB. Patient has not seen an ophthalmologist this year. Patient denies any issues with their feet. The symptom started onset as an adult hypertension and hyperlipidemia ARE RELATED TO DM   Hypertension Patient is currently on lisinopril, and their blood pressure today is 116/75. Patient denies any lightheadedness or dizziness. Patient denies headaches, blurred vision, chest pains, shortness of breath, or weakness. Denies any side effects from medication and is content with current medication.   Hyperlipidemia Patient is coming in for recheck of his hyperlipidemia. The patient is currently taking atorvastatin. They deny any issues with myalgias or history of liver damage from it. They deny any focal numbness or weakness or chest pain.   Relevant past medical, surgical, family and social history reviewed and updated as indicated. Interim medical history since our last visit reviewed. Allergies and medications reviewed and updated.  Review of Systems  Constitutional:  Negative for chills and fever.  Eyes:  Negative for visual disturbance.  Respiratory:  Negative for shortness of breath and wheezing.   Cardiovascular:  Negative for chest pain and leg swelling.  Musculoskeletal:  Negative for back pain and gait problem.  Skin:  Negative for rash.  Neurological:  Negative for dizziness, weakness and light-headedness.  All other systems reviewed and are negative.   Per HPI  unless specifically indicated above   Allergies as of 02/19/2022   No Known Allergies      Medication List        Accurate as of February 19, 2022  2:13 PM. If you have any questions, ask your nurse or doctor.          STOP taking these medications    citalopram 10 MG tablet Commonly known as: CELEXA Stopped by: Fransisca Kaufmann Pairlee Sawtell, MD   polyethylene glycol powder 17 GM/SCOOP powder Commonly known as: GLYCOLAX/MIRALAX Stopped by: Worthy Rancher, MD   tadalafil 20 MG tablet Commonly known as: CIALIS Stopped by: Fransisca Kaufmann Tymara Saur, MD   tamsulosin 0.4 MG Caps capsule Commonly known as: FLOMAX Stopped by: Fransisca Kaufmann Annely Sliva, MD       TAKE these medications    atorvastatin 40 MG tablet Commonly known as: LIPITOR Take 1 tablet (40 mg total) by mouth every evening.   blood glucose meter kit and supplies Kit Dispense based on patient and insurance preference. Use up to four times daily as directed.   empagliflozin 25 MG Tabs tablet Commonly known as: Jardiance Take 1 tablet (25 mg total) by mouth daily before breakfast.   glucose blood test strip Use as instructed to test blood sugar once daily.  Please fill One touch verio test strips. DX:E11.69   lisinopril 5 MG tablet Commonly known as: ZESTRIL Take 1 tablet (5 mg total) by mouth daily.   metFORMIN 500 MG tablet Commonly known as: GLUCOPHAGE Take 2 tablets (1,000 mg total) by  mouth 2 (two) times daily with a meal.   OneTouch Delica Lancets 59Y Misc Use to check blood sugar 2 times daily. E11.69   OneTouch Verio w/Device Kit Take 1 each by mouth in the morning and at bedtime. Use to test blood sugar daily         Objective:   BP 116/75   Pulse 83   Temp (!) 97 F (36.1 C)   Ht _0  (1.6 m)   Wt 139 lb (63 kg)   SpO2 95%   BMI 24.62 kg/m   Wt Readings from Last 3 Encounters:  02/19/22 139 lb (63 kg)  12/05/21 132 lb 6 oz (60 kg)  12/04/21 137 lb (62.1 kg)    Physical Exam Vitals  and nursing note reviewed.  Constitutional:      General: He is not in acute distress.    Appearance: He is well-developed. He is not diaphoretic.  Eyes:     General: No scleral icterus.    Conjunctiva/sclera: Conjunctivae normal.  Neck:     Thyroid: No thyromegaly.  Cardiovascular:     Rate and Rhythm: Normal rate and regular rhythm.     Heart sounds: Normal heart sounds. No murmur heard. Pulmonary:     Effort: Pulmonary effort is normal. No respiratory distress.     Breath sounds: Normal breath sounds. No wheezing.  Musculoskeletal:        General: No swelling. Normal range of motion.     Cervical back: Neck supple.  Lymphadenopathy:     Cervical: No cervical adenopathy.  Skin:    General: Skin is warm and dry.     Findings: No rash.  Neurological:     Mental Status: He is alert and oriented to person, place, and time.     Coordination: Coordination normal.  Psychiatric:        Behavior: Behavior normal.       Assessment & Plan:   Problem List Items Addressed This Visit       Cardiovascular and Mediastinum   Hypertension associated with diabetes ([place])     Endocrine   Type 2 diabetes mellitus with other specified complication ([place]) - Primary   Relevant Orders   Bayer DCA Hb A1c Waived   Microalbumin / creatinine urine ratio   Hyperlipidemia associated with type 2 diabetes mellitus (HCC)    A1c looks good at 6.3.  No changes blood pressure looks good as well. Follow up plan: Return in about 3 months (around 05/21/2022), or if symptoms worsen or fail to improve, for Diabetes recheck.  Counseling provided for all of the vaccine components Orders Placed This Encounter  Procedures   Bayer [place] Hb A1c Waived   Microalbumin / creatinine urine ratio    Caryl Pina, MD [facility] 02/19/2022, 2:13 PM

## 2022-02-20 LAB — MICROALBUMIN / CREATININE URINE RATIO
Creatinine, Urine: 56.7 mg/dL
Microalb/Creat Ratio: <5 mg/g creat (ref 0–29)
Microalbumin, Urine: <3 ug/mL

## 2022-04-02 ENCOUNTER — Encounter: Payer: Self-pay | Admitting: *Deleted

## 2022-05-12 ENCOUNTER — Encounter: Payer: Self-pay | Admitting: Family Medicine

## 2022-05-12 ENCOUNTER — Ambulatory Visit (INDEPENDENT_AMBULATORY_CARE_PROVIDER_SITE_OTHER): Payer: Medicare HMO | Admitting: Family Medicine

## 2022-05-12 VITALS — BP 112/68 | HR 65 | Temp 97.2°F | Ht 63.0 in | Wt 138.0 lb

## 2022-05-12 DIAGNOSIS — E1159 Type 2 diabetes mellitus with other circulatory complications: Secondary | ICD-10-CM | POA: Diagnosis not present

## 2022-05-12 DIAGNOSIS — I152 Hypertension secondary to endocrine disorders: Secondary | ICD-10-CM

## 2022-05-12 DIAGNOSIS — M4726 Other spondylosis with radiculopathy, lumbar region: Secondary | ICD-10-CM | POA: Diagnosis not present

## 2022-05-12 DIAGNOSIS — E785 Hyperlipidemia, unspecified: Secondary | ICD-10-CM | POA: Diagnosis not present

## 2022-05-12 DIAGNOSIS — E1169 Type 2 diabetes mellitus with other specified complication: Secondary | ICD-10-CM | POA: Diagnosis not present

## 2022-05-12 LAB — BAYER DCA HB A1C WAIVED: HB A1C (BAYER DCA - WAIVED): 6.3 % — ABNORMAL HIGH (ref 4.8–5.6)

## 2022-05-12 MED ORDER — METHYLPREDNISOLONE ACETATE 80 MG/ML IJ SUSP
80.0000 mg | Freq: Once | INTRAMUSCULAR | Status: AC
  Administered 2022-05-12: 80 mg via INTRA_ARTICULAR

## 2022-05-12 NOTE — Progress Notes (Signed)
BP 112/68   Pulse 65   Temp (!) 97.2 F (36.2 C)   Ht _0  (1.6 m)   Wt 138 lb (62.6 kg)   SpO2 99%   BMI 24.45 kg/m    Subjective:   Patient ID: Chase [name], male    DOB: 07/02/1950, 71 y.o.   MRN: [id number]  HPI: Chase [name] is a 71 y.o. male presenting on 05/12/2022 for Medical Management of Chronic Issues and Diabetes   HPI Type 2 diabetes mellitus Patient comes in today for recheck of his diabetes. Patient has been currently taking Jardiance and metformin. Patient is currently on an ACE inhibitor/ARB. Patient has seen an ophthalmologist this year. Patient denies any issues with their feet. The symptom started onset as an adult hypertension and hyperlipidemia ARE RELATED TO DM   Hypertension Patient is currently on lisinopril, and their blood pressure today is 112/68. Patient denies any lightheadedness or dizziness. Patient denies headaches, blurred vision, chest pains, shortness of breath, or weakness. Denies any side effects from medication and is content with current medication.   Hyperlipidemia Patient is coming in for recheck of his hyperlipidemia. The patient is currently taking atorvastatin. They deny any issues with myalgias or history of liver damage from it. They deny any focal numbness or weakness or chest pain.   Patient is having some sciatic low back pain that he had before this time on the left side and he wants an injection to help with that.  He has had an injection previously but it has been a while.  Relevant past medical, surgical, family and social history reviewed and updated as indicated. Interim medical history since our last visit reviewed. Allergies and medications reviewed and updated.  Review of Systems  Constitutional:  Negative for chills and fever.  Eyes:  Negative for visual disturbance.  Respiratory:  Negative for shortness of breath and wheezing.   Cardiovascular:  Negative for chest pain and leg swelling.  Musculoskeletal:   Negative for back pain and gait problem.  Skin:  Negative for rash.  Neurological:  Negative for dizziness, weakness and light-headedness.  All other systems reviewed and are negative.   Per HPI unless specifically indicated above   Allergies as of 05/12/2022   No Known Allergies      Medication List        Accurate as of May 12, 2022 12:20 PM. If you have any questions, ask your nurse or doctor.          atorvastatin 40 MG tablet Commonly known as: LIPITOR Take 1 tablet (40 mg total) by mouth every evening.   blood glucose meter kit and supplies Kit Dispense based on patient and insurance preference. Use up to four times daily as directed.   empagliflozin 25 MG Tabs tablet Commonly known as: Jardiance Take 1 tablet (25 mg total) by mouth daily before breakfast.   glucose blood test strip Use as instructed to test blood sugar once daily.  Please fill One touch verio test strips. DX:E11.69   lisinopril 5 MG tablet Commonly known as: ZESTRIL Take 1 tablet (5 mg total) by mouth daily.   metFORMIN 500 MG tablet Commonly known as: GLUCOPHAGE Take 2 tablets (1,000 mg total) by mouth 2 (two) times daily with a meal.   OneTouch Delica Lancets 62B Misc Use to check blood sugar 2 times daily. E11.69   OneTouch Verio w/Device Kit Take 1 each by mouth in the morning and at bedtime. Use to test blood  sugar daily         Objective:   BP 112/68   Pulse 65   Temp (!) 97.2 F (36.2 C)   Ht _0  (1.6 m)   Wt 138 lb (62.6 kg)   SpO2 99%   BMI 24.45 kg/m   Wt Readings from Last 3 Encounters:  05/12/22 138 lb (62.6 kg)  02/19/22 139 lb (63 kg)  12/05/21 132 lb 6 oz (60 kg)    Physical Exam Vitals and nursing note reviewed.  Constitutional:      General: He is not in acute distress.    Appearance: He is well-developed. He is not diaphoretic.  Eyes:     General: No scleral icterus.    Conjunctiva/sclera: Conjunctivae normal.  Neck:     Thyroid: No  thyromegaly.  Cardiovascular:     Rate and Rhythm: Normal rate and regular rhythm.     Heart sounds: Normal heart sounds. No murmur heard. Pulmonary:     Effort: Pulmonary effort is normal. No respiratory distress.     Breath sounds: Normal breath sounds. No wheezing.  Musculoskeletal:        General: No swelling. Normal range of motion.     Cervical back: Neck supple.  Lymphadenopathy:     Cervical: No cervical adenopathy.  Skin:    General: Skin is warm and dry.     Findings: No rash.  Neurological:     Mental Status: He is alert and oriented to person, place, and time.     Coordination: Coordination normal.  Psychiatric:        Behavior: Behavior normal.       Assessment & Plan:   Problem List Items Addressed This Visit       Cardiovascular and Mediastinum   Hypertension associated with diabetes ([place])   Relevant Orders   CBC with Differential/Platelet   CMP14+EGFR   Lipid panel   Bayer DCA Hb A1c Waived     Endocrine   Type 2 diabetes mellitus with other specified complication ([place]) - Primary   Relevant Orders   CBC with Differential/Platelet   CMP14+EGFR   Lipid panel   Bayer DCA Hb A1c Waived   Hyperlipidemia associated with type 2 diabetes mellitus (HCC)   Relevant Orders   CBC with Differential/Platelet   CMP14+EGFR   Lipid panel   Bayer DCA Hb A1c Waived     Nervous and Auditory   Osteoarthritis of spine with radiculopathy, lumbar region   Relevant Medications   methylPREDNISolone acetate (DEPO-MEDROL) injection 80 mg (Start on 05/12/2022 12:30 PM)    Gave intramuscular Depo 80 for sciatic pain  A1c looks good at 6.3.  No changes Follow up plan: Return in about 3 months (around 08/11/2022), or if symptoms worsen or fail to improve, for Diabetes recheck.  Counseling provided for all of the vaccine components Orders Placed This Encounter  Procedures   CBC with Differential/Platelet   CMP14+EGFR   Lipid panel   Bayer DCA Hb A1c Waived     Caryl Pina, MD [facility] diabetes recheck 05/12/2022, 12:20 PM

## 2022-05-13 LAB — CMP14+EGFR
ALT: 16 IU/L (ref 0–44)
AST: 9 IU/L (ref 0–40)
Albumin/Globulin Ratio: 2.1 (ref 1.2–2.2)
Albumin: 4.6 g/dL (ref 3.9–4.9)
Alkaline Phosphatase: 67 IU/L (ref 44–121)
BUN/Creatinine Ratio: 26 — ABNORMAL HIGH (ref 10–24)
BUN: 17 mg/dL (ref 8–27)
Bilirubin Total: 0.3 mg/dL (ref 0.0–1.2)
CO2: 23 mmol/L (ref 20–29)
Calcium: 9.6 mg/dL (ref 8.6–10.2)
Chloride: 100 mmol/L (ref 96–106)
Creatinine, Ser: 0.66 mg/dL — ABNORMAL LOW (ref 0.76–1.27)
Globulin, Total: 2.2 g/dL (ref 1.5–4.5)
Glucose: 139 mg/dL — ABNORMAL HIGH (ref 70–99)
Potassium: 4.1 mmol/L (ref 3.5–5.2)
Sodium: 141 mmol/L (ref 134–144)
Total Protein: 6.8 g/dL (ref 6.0–8.5)
eGFR: 102 mL/min/{1.73_m2} (ref >59)

## 2022-05-13 LAB — CBC WITH DIFFERENTIAL/PLATELET
Basophils Absolute: 0.1 10*3/uL (ref 0.0–0.2)
Basos: 2 %
EOS (ABSOLUTE): 0.2 10*3/uL (ref 0.0–0.4)
Eos: 5 %
Hematocrit: 49.1 % (ref 37.5–51.0)
Hemoglobin: 16.2 g/dL (ref 13.0–17.7)
Immature Grans (Abs): 0.1 10*3/uL (ref 0.0–0.1)
Immature Granulocytes: 2 %
Lymphocytes Absolute: 0.8 10*3/uL (ref 0.7–3.1)
Lymphs: 21 %
MCH: 29.8 pg (ref 26.6–33.0)
MCHC: 33 g/dL (ref 31.5–35.7)
MCV: 90 fL (ref 79–97)
Monocytes Absolute: 0.3 10*3/uL (ref 0.1–0.9)
Monocytes: 6 %
Neutrophils Absolute: 2.7 10*3/uL (ref 1.4–7.0)
Neutrophils: 64 %
Platelets: 197 10*3/uL (ref 150–450)
RBC: 5.43 x10E6/uL (ref 4.14–5.80)
RDW: 14 % (ref 11.6–15.4)
WBC: 4.1 10*3/uL (ref 3.4–10.8)

## 2022-05-13 LAB — LIPID PANEL
Chol/HDL Ratio: 4.5 ratio (ref 0.0–5.0)
Cholesterol, Total: 161 mg/dL (ref 100–199)
HDL: 36 mg/dL — ABNORMAL LOW (ref >39)
LDL Chol Calc (NIH): 84 mg/dL (ref 0–99)
Triglycerides: 247 mg/dL — ABNORMAL HIGH (ref 0–149)
VLDL Cholesterol Cal: 41 mg/dL — ABNORMAL HIGH (ref 5–40)

## 2022-05-16 ENCOUNTER — Ambulatory Visit: Payer: Medicare HMO | Admitting: Family Medicine

## 2022-08-11 ENCOUNTER — Ambulatory Visit (INDEPENDENT_AMBULATORY_CARE_PROVIDER_SITE_OTHER): Payer: Medicare HMO | Admitting: Family Medicine

## 2022-08-11 ENCOUNTER — Encounter: Payer: Self-pay | Admitting: Family Medicine

## 2022-08-11 VITALS — BP 118/76 | HR 77 | Ht 63.0 in | Wt 148.0 lb

## 2022-08-11 DIAGNOSIS — E785 Hyperlipidemia, unspecified: Secondary | ICD-10-CM | POA: Diagnosis not present

## 2022-08-11 DIAGNOSIS — E1159 Type 2 diabetes mellitus with other circulatory complications: Secondary | ICD-10-CM | POA: Diagnosis not present

## 2022-08-11 DIAGNOSIS — R42 Dizziness and giddiness: Secondary | ICD-10-CM | POA: Diagnosis not present

## 2022-08-11 DIAGNOSIS — E1169 Type 2 diabetes mellitus with other specified complication: Secondary | ICD-10-CM

## 2022-08-11 DIAGNOSIS — I152 Hypertension secondary to endocrine disorders: Secondary | ICD-10-CM | POA: Diagnosis not present

## 2022-08-11 LAB — BAYER DCA HB A1C WAIVED: HB A1C (BAYER DCA - WAIVED): 6.8 % — ABNORMAL HIGH (ref 4.8–5.6)

## 2022-08-11 MED ORDER — MECLIZINE HCL 25 MG PO TABS: 25.0000 mg | ORAL_TABLET | Freq: Three times a day (TID) | ORAL | 1 refills | Status: DC | PRN

## 2022-08-11 NOTE — Progress Notes (Signed)
BP 118/76   Pulse 77   Ht '5\' 3"'$  (1.6 m)   Wt 148 lb (67.1 kg)   SpO2 97%   BMI 26.22 kg/m    Subjective:   Patient ID: Chase [name], male    DOB: June 05, 1950, 72 y.o.   MRN: [id number]  HPI: Chase [name] is a 72 y.o. male presenting on 08/11/2022 for Medical Management of Chronic Issues and Diabetes   HPI Type 2 diabetes mellitus Patient comes in today for recheck of his diabetes. Patient has been currently taking Jardiance and metformin. Patient is currently on an ACE inhibitor/ARB. Patient has not seen an ophthalmologist this year. Patient denies any issues with their feet. The symptom started onset as an adult hypertension and hyperlipidemia ARE RELATED TO DM   Hyperlipidemia Patient is coming in for recheck of his hyperlipidemia. The patient is currently taking Jardiance and metformin. They deny any issues with myalgias or history of liver damage from it. They deny any focal numbness or weakness or chest pain.   Hypertension Patient is currently on lisinopril, and their blood pressure today is 118/76. Patient denies any lightheadedness or dizziness. Patient denies headaches, blurred vision, chest pains, shortness of breath, or weakness. Denies any side effects from medication and is content with current medication.   He has been getting a little bit of dizziness and vertigo recently, has had this once before and he wants the meclizine that he had before.  It helped him a lot before and he has not had it in quite some time but is getting again over the past couple days.  Relevant past medical, surgical, family and social history reviewed and updated as indicated. Interim medical history since our last visit reviewed. Allergies and medications reviewed and updated.  Review of Systems  Constitutional:  Negative for chills and fever.  Eyes:  Negative for visual disturbance.  Respiratory:  Negative for shortness of breath and wheezing.   Cardiovascular:  Negative for  chest pain and leg swelling.  Musculoskeletal:  Negative for back pain and gait problem.  Skin:  Negative for rash.  Neurological:  Negative for dizziness, weakness and light-headedness.  All other systems reviewed and are negative.   Per HPI unless specifically indicated above   Allergies as of 08/11/2022   No Known Allergies      Medication List        Accurate as of August 11, 2022 11:23 AM. If you have any questions, ask your nurse or doctor.          atorvastatin 40 MG tablet Commonly known as: LIPITOR Take 1 tablet (40 mg total) by mouth every evening.   blood glucose meter kit and supplies Kit Dispense based on patient and insurance preference. Use up to four times daily as directed.   empagliflozin 25 MG Tabs tablet Commonly known as: Jardiance Take 1 tablet (25 mg total) by mouth daily before breakfast.   glucose blood test strip Use as instructed to test blood sugar once daily.  Please fill One touch verio test strips. DX:E11.69   lisinopril 5 MG tablet Commonly known as: ZESTRIL Take 1 tablet (5 mg total) by mouth daily.   meclizine 25 MG tablet Commonly known as: ANTIVERT Take 1 tablet (25 mg total) by mouth 3 (three) times daily as needed for dizziness. Started by: Worthy Rancher, MD   metFORMIN 500 MG tablet Commonly known as: GLUCOPHAGE Take 2 tablets (1,000 mg total) by mouth 2 (two) times daily with  a meal.   OneTouch Delica Lancets 99991111 Misc Use to check blood sugar 2 times daily. E11.69   OneTouch Verio w/Device Kit Take 1 each by mouth in the morning and at bedtime. Use to test blood sugar daily         Objective:   BP 118/76   Pulse 77   Ht '5\' 3"'$  (1.6 m)   Wt 148 lb (67.1 kg)   SpO2 97%   BMI 26.22 kg/m   Wt Readings from Last 3 Encounters:  08/11/22 148 lb (67.1 kg)  05/12/22 138 lb (62.6 kg)  02/19/22 139 lb (63 kg)    Physical Exam Vitals and nursing note reviewed.  Constitutional:      General: He is not in  acute distress.    Appearance: He is well-developed. He is not diaphoretic.  Eyes:     General: No scleral icterus.    Conjunctiva/sclera: Conjunctivae normal.  Neck:     Thyroid: No thyromegaly.  Cardiovascular:     Rate and Rhythm: Normal rate and regular rhythm.     Heart sounds: Normal heart sounds. No murmur heard. Pulmonary:     Effort: Pulmonary effort is normal. No respiratory distress.     Breath sounds: Normal breath sounds. No wheezing.  Musculoskeletal:        General: Normal range of motion.     Cervical back: Neck supple.  Lymphadenopathy:     Cervical: No cervical adenopathy.  Skin:    General: Skin is warm and dry.     Findings: No rash.  Neurological:     Mental Status: He is alert and oriented to person, place, and time.     Coordination: Coordination normal.  Psychiatric:        Behavior: Behavior normal.       Assessment & Plan:   Problem List Items Addressed This Visit       Cardiovascular and Mediastinum   Hypertension associated with diabetes ([place])     Endocrine   Type 2 diabetes mellitus with other specified complication ([place]) - Primary   Relevant Orders   Bayer DCA Hb A1c Waived   Hyperlipidemia associated with type 2 diabetes mellitus ([place])   Other Visit Diagnoses     Vertigo       Relevant Medications   meclizine (ANTIVERT) 25 MG tablet       A1c 6.8, slightly higher than what it was before but mainly focus on diet.  Will give meclizine for vertigo, otherwise no changes. Follow up plan: Return in about 3 months (around 11/11/2022), or if symptoms worsen or fail to improve, for Diabetes and hypertension and cholesterol.  Counseling provided for all of the vaccine components Orders Placed This Encounter  Procedures   Bayer [place] Hb A1c [place] Tynan Boesel, MD [facility] 08/11/2022, 11:23 AM

## 2022-08-18 DIAGNOSIS — C61 Malignant neoplasm of prostate: Secondary | ICD-10-CM | POA: Diagnosis not present

## 2022-08-25 DIAGNOSIS — R3911 Hesitancy of micturition: Secondary | ICD-10-CM | POA: Diagnosis not present

## 2022-08-25 DIAGNOSIS — C61 Malignant neoplasm of prostate: Secondary | ICD-10-CM | POA: Diagnosis not present

## 2022-08-25 DIAGNOSIS — N5201 Erectile dysfunction due to arterial insufficiency: Secondary | ICD-10-CM | POA: Diagnosis not present

## 2022-11-13 ENCOUNTER — Ambulatory Visit (INDEPENDENT_AMBULATORY_CARE_PROVIDER_SITE_OTHER): Payer: Medicare HMO | Admitting: Family Medicine

## 2022-11-13 ENCOUNTER — Encounter: Payer: Self-pay | Admitting: Family Medicine

## 2022-11-13 VITALS — BP 123/77 | HR 74 | Ht 63.0 in | Wt 135.0 lb

## 2022-11-13 DIAGNOSIS — E1159 Type 2 diabetes mellitus with other circulatory complications: Secondary | ICD-10-CM

## 2022-11-13 DIAGNOSIS — Z7984 Long term (current) use of oral hypoglycemic drugs: Secondary | ICD-10-CM

## 2022-11-13 DIAGNOSIS — C61 Malignant neoplasm of prostate: Secondary | ICD-10-CM

## 2022-11-13 DIAGNOSIS — E1169 Type 2 diabetes mellitus with other specified complication: Secondary | ICD-10-CM

## 2022-11-13 DIAGNOSIS — I152 Hypertension secondary to endocrine disorders: Secondary | ICD-10-CM

## 2022-11-13 DIAGNOSIS — E785 Hyperlipidemia, unspecified: Secondary | ICD-10-CM

## 2022-11-13 LAB — CBC WITH DIFFERENTIAL/PLATELET
Basophils Absolute: 0 10*3/uL (ref 0.0–0.2)
Basos: 1 %
EOS (ABSOLUTE): 0.1 10*3/uL (ref 0.0–0.4)
Eos: 3 %
Hematocrit: 48.7 % (ref 37.5–51.0)
Hemoglobin: 16.6 g/dL (ref 13.0–17.7)
Immature Grans (Abs): 0 10*3/uL (ref 0.0–0.1)
Immature Granulocytes: 1 %
Lymphocytes Absolute: 0.8 10*3/uL (ref 0.7–3.1)
Lymphs: 23 %
MCH: 30.4 pg (ref 26.6–33.0)
MCHC: 34.1 g/dL (ref 31.5–35.7)
MCV: 89 fL (ref 79–97)
Monocytes Absolute: 0.3 10*3/uL (ref 0.1–0.9)
Monocytes: 7 %
Neutrophils Absolute: 2.5 10*3/uL (ref 1.4–7.0)
Neutrophils: 65 %
Platelets: 220 10*3/uL (ref 150–450)
RBC: 5.46 x10E6/uL (ref 4.14–5.80)
RDW: 13.9 % (ref 11.6–15.4)
WBC: 3.7 10*3/uL (ref 3.4–10.8)

## 2022-11-13 LAB — BAYER DCA HB A1C WAIVED: HB A1C (BAYER DCA - WAIVED): 6.8 % — ABNORMAL HIGH (ref 4.8–5.6)

## 2022-11-13 LAB — LIPID PANEL
Chol/HDL Ratio: 4.1 ratio (ref 0.0–5.0)
Cholesterol, Total: 162 mg/dL (ref 100–199)
HDL: 40 mg/dL (ref >39)
LDL Chol Calc (NIH): 85 mg/dL (ref 0–99)
Triglycerides: 217 mg/dL — ABNORMAL HIGH (ref 0–149)
VLDL Cholesterol Cal: 37 mg/dL (ref 5–40)

## 2022-11-13 LAB — CMP14+EGFR
ALT: 22 IU/L (ref 0–44)
AST: 16 IU/L (ref 0–40)
Albumin/Globulin Ratio: 2.6
Albumin: 4.4 g/dL (ref 3.9–4.9)
Alkaline Phosphatase: 62 IU/L (ref 44–121)
BUN/Creatinine Ratio: 21 (ref 10–24)
BUN: 13 mg/dL (ref 8–27)
Bilirubin Total: 0.3 mg/dL (ref 0.0–1.2)
CO2: 21 mmol/L (ref 20–29)
Calcium: 9.5 mg/dL (ref 8.6–10.2)
Chloride: 102 mmol/L (ref 96–106)
Creatinine, Ser: 0.63 mg/dL — ABNORMAL LOW (ref 0.76–1.27)
Globulin, Total: 1.7 g/dL (ref 1.5–4.5)
Glucose: 134 mg/dL — ABNORMAL HIGH (ref 70–99)
Potassium: 4.4 mmol/L (ref 3.5–5.2)
Sodium: 139 mmol/L (ref 134–144)
Total Protein: 6.1 g/dL (ref 6.0–8.5)
eGFR: 102 mL/min/{1.73_m2} (ref >59)

## 2022-11-13 NOTE — Patient Instructions (Signed)
[facility] Address: [address], [place], [place] [address]  Phone: [phone]

## 2022-11-13 NOTE — Progress Notes (Signed)
BP 123/77   Pulse 74   Ht 5\' 3"  (1.6 m)   Wt 135 lb (61.2 kg)   SpO2 96%   BMI 23.91 kg/m    Subjective:   Patient ID: Chase [name], male    DOB: 08/22/1950, 72 y.o.   MRN: [id number]  HPI: Chase [name] is a 72 y.o. male presenting on 11/13/2022 for Medical Management of Chronic Issues and Diabetes   HPI Type 2 diabetes mellitus Patient comes in today for recheck of his diabetes. Patient has been currently taking metformin and Jardiance. Patient is currently on an ACE inhibitor/ARB. Patient has not seen an ophthalmologist this year. Patient denies any new issues with their feet. The symptom started onset as an adult hypertension and hyperlipidemia ARE RELATED TO DM   Hyperlipidemia Patient is coming in for recheck of his hyperlipidemia. The patient is currently taking atorvastatin. They deny any issues with myalgias or history of liver damage from it. They deny any focal numbness or weakness or chest pain.   Hypertension Patient is currently on lisinopril, and their blood pressure today is 123/77. Patient denies any lightheadedness or dizziness. Patient denies headaches, blurred vision, chest pains, shortness of breath, or weakness. Denies any side effects from medication and is content with current medication.   Prostate cancer Patient is coming in for recheck on prostate cancer Symptoms: None currently Medication: Tamsulosin Last PSA: Saw urology in March 2024  Relevant past medical, surgical, family and social history reviewed and updated as indicated. Interim medical history since our last visit reviewed. Allergies and medications reviewed and updated.  Review of Systems  Constitutional:  Negative for chills and fever.  Eyes:  Negative for visual disturbance.  Respiratory:  Negative for shortness of breath and wheezing.   Cardiovascular:  Negative for chest pain and leg swelling.  Musculoskeletal:  Negative for back pain and gait problem.  Skin:  Negative for  rash.  Neurological:  Negative for dizziness, weakness and light-headedness.  All other systems reviewed and are negative.   Per HPI unless specifically indicated above   Allergies as of 11/13/2022   No Known Allergies      Medication List        Accurate as of November 13, 2022 11:11 AM. If you have any questions, ask your nurse or doctor.          atorvastatin 40 MG tablet Commonly known as: LIPITOR Take 1 tablet (40 mg total) by mouth every evening.   blood glucose meter kit and supplies Kit Dispense based on patient and insurance preference. Use up to four times daily as directed.   empagliflozin 25 MG Tabs tablet Commonly known as: Jardiance Take 1 tablet (25 mg total) by mouth daily before breakfast.   glucose blood test strip Use as instructed to test blood sugar once daily.  Please fill One touch verio test strips. DX:E11.69   lisinopril 5 MG tablet Commonly known as: ZESTRIL Take 1 tablet (5 mg total) by mouth daily.   meclizine 25 MG tablet Commonly known as: ANTIVERT Take 1 tablet (25 mg total) by mouth 3 (three) times daily as needed for dizziness.   metFORMIN 500 MG tablet Commonly known as: GLUCOPHAGE Take 2 tablets (1,000 mg total) by mouth 2 (two) times daily with a meal.   OneTouch Delica Lancets 33G Misc Use to check blood sugar 2 times daily. E11.69   OneTouch Verio w/Device Kit Take 1 each by mouth in the morning and at bedtime. Use  to test blood sugar daily         Objective:   BP 123/77   Pulse 74   Ht 5\' 3"  (1.6 m)   Wt 135 lb (61.2 kg)   SpO2 96%   BMI 23.91 kg/m   Wt Readings from Last 3 Encounters:  11/13/22 135 lb (61.2 kg)  08/11/22 148 lb (67.1 kg)  05/12/22 138 lb (62.6 kg)    Physical Exam Vitals and nursing note reviewed.  Constitutional:      General: He is not in acute distress.    Appearance: He is well-developed. He is not diaphoretic.  Eyes:     General: No scleral icterus.    Conjunctiva/sclera:  Conjunctivae normal.  Neck:     Thyroid: No thyromegaly.  Cardiovascular:     Rate and Rhythm: Normal rate and regular rhythm.     Heart sounds: Normal heart sounds. No murmur heard. Pulmonary:     Effort: Pulmonary effort is normal. No respiratory distress.     Breath sounds: Normal breath sounds. No wheezing.  Musculoskeletal:        General: No swelling. Normal range of motion.     Cervical back: Neck supple.  Lymphadenopathy:     Cervical: No cervical adenopathy.  Skin:    General: Skin is warm and dry.     Findings: No rash.  Neurological:     Mental Status: He is alert and oriented to person, place, and time.     Coordination: Coordination normal.  Psychiatric:        Behavior: Behavior normal.       Assessment & Plan:   Problem List Items Addressed This Visit       Cardiovascular and Mediastinum   Hypertension associated with diabetes ([facility])   Relevant Orders   CBC with Differential/Platelet   CMP14+EGFR   Lipid panel   Bayer DCA Hb A1c Waived     Endocrine   Type 2 diabetes mellitus with other specified complication ([facility]) - Primary   Relevant Orders   CBC with Differential/Platelet   CMP14+EGFR   Lipid panel   Bayer DCA Hb A1c Waived   Hyperlipidemia associated with type 2 diabetes mellitus ([facility])   Relevant Orders   CBC with Differential/Platelet   CMP14+EGFR   Lipid panel   Bayer DCA Hb A1c Waived     Genitourinary   Prostate cancer ([facility])   Relevant Orders   PSA, total and free    A1c was 6.8 which is slightly elevated but still within good range.  Focus on diet and exercise.  Printed outpatient and number for his gastroenterology to go back for colonoscopy.  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  Has some difficulty sleeping and recommended melatonin over-the-counter. Follow up plan: Return in about 3 months (around 02/13/2023), or if symptoms worsen or fail to improve, for Diabetes recheck.  Counseling provided for  all of the vaccine components Orders Placed This Encounter  Procedures   CBC with Differential/Platelet   CMP14+EGFR   Lipid panel   Bayer DCA Hb A1c Waived   PSA, total and free    Arville Care, MD [facility] 11/13/2022, 11:11 AM

## 2022-11-14 LAB — SPECIMEN STATUS REPORT

## 2022-11-15 LAB — PSA, TOTAL AND FREE
PSA, Free Pct: 14.3 %
PSA, Free: 0.1 ng/mL
Prostate Specific Ag, Serum: 0.7 ng/mL (ref 0.0–4.0)

## 2022-11-17 ENCOUNTER — Emergency Department (HOSPITAL_COMMUNITY)
Admission: EM | Admit: 2022-11-17 | Discharge: 2022-11-17 | Disposition: A | Discharge status: Home / Self Care | Payer: Medicare HMO | Attending: Emergency Medicine | Admitting: Emergency Medicine

## 2022-11-17 ENCOUNTER — Emergency Department (HOSPITAL_COMMUNITY): Payer: Medicare HMO

## 2022-11-17 ENCOUNTER — Other Ambulatory Visit: Payer: Self-pay

## 2022-11-17 DIAGNOSIS — N453 Epididymo-orchitis: Secondary | ICD-10-CM | POA: Diagnosis not present

## 2022-11-17 DIAGNOSIS — Z8546 Personal history of malignant neoplasm of prostate: Secondary | ICD-10-CM | POA: Insufficient documentation

## 2022-11-17 DIAGNOSIS — R300 Dysuria: Secondary | ICD-10-CM | POA: Diagnosis not present

## 2022-11-17 DIAGNOSIS — N433 Hydrocele, unspecified: Secondary | ICD-10-CM | POA: Diagnosis not present

## 2022-11-17 DIAGNOSIS — N3000 Acute cystitis without hematuria: Secondary | ICD-10-CM | POA: Diagnosis not present

## 2022-11-17 DIAGNOSIS — N5089 Other specified disorders of the male genital organs: Secondary | ICD-10-CM | POA: Diagnosis not present

## 2022-11-17 DIAGNOSIS — N3289 Other specified disorders of bladder: Secondary | ICD-10-CM | POA: Diagnosis not present

## 2022-11-17 LAB — CBC WITH DIFFERENTIAL/PLATELET
Abs Immature Granulocytes: 0.04 10*3/uL (ref 0.00–0.07)
Basophils Absolute: 0 10*3/uL (ref 0.0–0.1)
Basophils Relative: 0 %
Eosinophils Absolute: 0.1 10*3/uL (ref 0.0–0.5)
Eosinophils Relative: 1 %
HCT: 44.1 % (ref 39.0–52.0)
Hemoglobin: 14.8 g/dL (ref 13.0–17.0)
Immature Granulocytes: 0 %
Lymphocytes Relative: 4 %
Lymphs Abs: 0.4 10*3/uL — ABNORMAL LOW (ref 0.7–4.0)
MCH: 30.6 pg (ref 26.0–34.0)
MCHC: 33.6 g/dL (ref 30.0–36.0)
MCV: 91.3 fL (ref 80.0–100.0)
Monocytes Absolute: 0.7 10*3/uL (ref 0.1–1.0)
Monocytes Relative: 7 %
Neutro Abs: 8.5 10*3/uL — ABNORMAL HIGH (ref 1.7–7.7)
Neutrophils Relative %: 88 %
Platelets: 198 10*3/uL (ref 150–400)
RBC: 4.83 MIL/uL (ref 4.22–5.81)
RDW: 14.3 % (ref 11.5–15.5)
WBC: 9.7 10*3/uL (ref 4.0–10.5)
nRBC: 0 % (ref 0.0–0.2)

## 2022-11-17 LAB — BASIC METABOLIC PANEL
Anion gap: 10 (ref 5–15)
BUN: 14 mg/dL (ref 8–23)
CO2: 21 mmol/L — ABNORMAL LOW (ref 22–32)
Calcium: 8.7 mg/dL — ABNORMAL LOW (ref 8.9–10.3)
Chloride: 102 mmol/L (ref 98–111)
Creatinine, Ser: 0.51 mg/dL — ABNORMAL LOW (ref 0.61–1.24)
GFR, Estimated: >60 mL/min (ref >60)
Glucose, Bld: 167 mg/dL — ABNORMAL HIGH (ref 70–99)
Potassium: 3.8 mmol/L (ref 3.5–5.1)
Sodium: 133 mmol/L — ABNORMAL LOW (ref 135–145)

## 2022-11-17 LAB — URINALYSIS, W/ REFLEX TO CULTURE (INFECTION SUSPECTED)
Bacteria, UA: NONE SEEN
Bilirubin Urine: NEGATIVE
Glucose, UA: >=500 mg/dL — AB
Ketones, ur: 5 mg/dL — AB
Nitrite: POSITIVE — AB
Protein, ur: 30 mg/dL — AB
RBC / HPF: >50 RBC/hpf (ref 0–5)
Specific Gravity, Urine: 1.023 (ref 1.005–1.030)
WBC, UA: >50 WBC/hpf (ref 0–5)
pH: 6 (ref 5.0–8.0)

## 2022-11-17 MED ORDER — SODIUM CHLORIDE 0.9 % IV SOLN
1.0000 g | Freq: Once | INTRAVENOUS | Status: AC
  Administered 2022-11-17: 1 g via INTRAVENOUS
  Filled 2022-11-17: qty 10

## 2022-11-17 MED ORDER — FENTANYL CITRATE PF 50 MCG/ML IJ SOSY
50.0000 ug | PREFILLED_SYRINGE | Freq: Once | INTRAMUSCULAR | Status: AC
  Administered 2022-11-17: 50 ug via INTRAVENOUS
  Filled 2022-11-17: qty 1

## 2022-11-17 MED ORDER — OXYCODONE-ACETAMINOPHEN 5-325 MG PO TABS
1.0000 | ORAL_TABLET | Freq: Once | ORAL | Status: AC
  Administered 2022-11-17: 1 via ORAL
  Filled 2022-11-17: qty 1

## 2022-11-17 MED ORDER — KETOROLAC TROMETHAMINE 15 MG/ML IJ SOLN
15.0000 mg | Freq: Once | INTRAMUSCULAR | Status: AC
  Administered 2022-11-17: 15 mg via INTRAVENOUS
  Filled 2022-11-17: qty 1

## 2022-11-17 MED ORDER — ONDANSETRON HCL 4 MG/2ML IJ SOLN
4.0000 mg | Freq: Once | INTRAMUSCULAR | Status: AC
  Administered 2022-11-17: 4 mg via INTRAVENOUS
  Filled 2022-11-17: qty 2

## 2022-11-17 MED ORDER — LEVOFLOXACIN 500 MG PO TABS: 500.0000 mg | ORAL_TABLET | Freq: Every day | ORAL | 0 refills | Status: AC

## 2022-11-17 NOTE — ED Provider Notes (Signed)
[place] EMERGENCY DEPARTMENT AT [facility]  Provider Note  CSN: [id number] Arrival date & time: 11/17/22 4098  History Chief Complaint  Patient presents with   Urinary Retention   Spanish Interpreter [phone] Chase [name] is a 72 y.o. male with history of prostate cancer reports onset of L testicle pain, dysuria, frequency and incomplete voiding yesterday. Some L flank pain. No fever or vomiting. Has been able to urinate during the night but only small amounts at a time.    Home Medications Prior to Admission medications   Medication Sig Start Date End Date Taking? Authorizing Provider  atorvastatin (LIPITOR) 40 MG tablet Take 1 tablet (40 mg total) by mouth every evening. 11/18/21   Dettinger, Elige Radon, MD  blood glucose meter kit and supplies KIT Dispense based on patient and insurance preference. Use up to four times daily as directed. 02/01/21   Dettinger, Elige Radon, MD  Blood Glucose Monitoring Suppl (ONETOUCH VERIO) w/Device KIT Take 1 each by mouth in the morning and at bedtime. Use to test blood sugar daily 08/01/20   Dettinger, Elige Radon, MD  empagliflozin (JARDIANCE) 25 MG TABS tablet Take 1 tablet (25 mg total) by mouth daily before breakfast. 11/18/21   Dettinger, Elige Radon, MD  glucose blood test strip Use as instructed to test blood sugar once daily.  Please fill One touch verio test strips. DX:E11.69 09/26/19   Dettinger, Elige Radon, MD  lisinopril (ZESTRIL) 5 MG tablet Take 1 tablet (5 mg total) by mouth daily. 11/18/21   Dettinger, Elige Radon, MD  meclizine (ANTIVERT) 25 MG tablet Take 1 tablet (25 mg total) by mouth 3 (three) times daily as needed for dizziness. 08/11/22   Dettinger, Elige Radon, MD  metFORMIN (GLUCOPHAGE) 500 MG tablet Take 2 tablets (1,000 mg total) by mouth 2 (two) times daily with a meal. 11/18/21   Dettinger, Elige Radon, MD  OneTouch Delica Lancets 33G MISC Use to check blood sugar 2 times daily. E11.69 10/17/19   Dettinger, Elige Radon, MD      Allergies    Patient has no known allergies.   Review of Systems   Review of Systems Please see HPI for pertinent positives and negatives  Physical Exam BP 129/78 (BP Location: Left Arm)   Pulse 87   Temp 98.3 F (36.8 C) (Oral)   Resp 19   Ht 5\' 3"  (1.6 m)   Wt 61.2 kg   SpO2 96%   BMI 23.90 kg/m   Physical Exam Vitals and nursing note reviewed.  Constitutional:      Appearance: Normal appearance.  HENT:     Head: Normocephalic and atraumatic.     Nose: Nose normal.     Mouth/Throat:     Mouth: Mucous membranes are moist.  Eyes:     Extraocular Movements: Extraocular movements intact.     Conjunctiva/sclera: Conjunctivae normal.  Cardiovascular:     Rate and Rhythm: Normal rate.  Pulmonary:     Effort: Pulmonary effort is normal.     Breath sounds: Normal breath sounds.  Abdominal:     General: Abdomen is flat.     Palpations: Abdomen is soft.     Tenderness: There is no abdominal tenderness.  Genitourinary:    Comments: Tender and swollen L testicle Musculoskeletal:        General: No swelling. Normal range of motion.     Cervical back: Neck supple.  Skin:    General: Skin is warm and dry.  Neurological:  General: No focal deficit present.     Mental Status: He is alert.  Psychiatric:        Mood and Affect: Mood normal.     ED Results / Procedures / Treatments   EKG None  Procedures Procedures  Medications Ordered in the ED Medications  cefTRIAXone (ROCEPHIN) 1 g in sodium chloride 0.9 % 100 mL IVPB (1 g Intravenous New Bag/Given 11/17/22 0641)  fentaNYL (SUBLIMAZE) injection 50 mcg (50 mcg Intravenous Given 11/17/22 0452)  ondansetron (ZOFRAN) injection 4 mg (4 mg Intravenous Given 11/17/22 0453)    Initial Impression and Plan  Patient here with L testicle pain, dysuria and flank pain, concern for UTI/orchitis vs renal colic with referred pain. Will check labs, send for CT. Pain meds for comfort.   ED Course   Clinical Course as  of 11/17/22 0703  Mon Nov 17, 2022  0528 CBC is normal. BMP without signs of AKI.  [CS]  0555 UA consistent with UTI. CT is pending. Will also plan [place] of scrotum this AM.  [CS]  1610 I personally viewed the images from radiology studies and agree with radiologist interpretation: CT neg for stone. Bladder wall thickening consistent with UTI. [CS]  0702 Care of the patient will be signed out to the oncoming team at the change of shift pending scrotal US.  [CS]    Clinical Course User Index [CS] Pollyann Savoy, MD     MDM Rules/Calculators/A&P Medical Decision Making Problems Addressed: Acute cystitis without hematuria: acute illness or injury  Amount and/or Complexity of Data Reviewed Labs: ordered. Decision-making details documented in ED Course. Radiology: ordered and independent interpretation performed. Decision-making details documented in ED Course.  Risk Prescription drug management. Parenteral controlled substances.     Final Clinical Impression(s) / ED Diagnoses Final diagnoses:  Acute cystitis without hematuria    Rx / DC Orders ED Discharge Orders     None        Pollyann Savoy, MD 11/17/22 [phone]

## 2022-11-17 NOTE — Discharge Instructions (Signed)
A prescription for antibiotics was sent to your pharmacy.  Take as prescribed for the next 10 days.  Call the number below to set up a follow-up appointment with a urologist.  Take Tylenol and ibuprofen as needed for pain.  Return to the emergency department for any new or worsening symptoms of concern.

## 2022-11-17 NOTE — ED Provider Notes (Signed)
Care of patient assumed from Dr. Bernette Mayers.  This patient presents with dysuria.  He is found to have swollen testicle on exam.  He is currently getting treated for UTI.  He has a scrotal ultrasound pending for possible orchitis.  Discharge is anticipated. Physical Exam  BP 129/78 (BP Location: Left Arm)   Pulse 87   Temp 98.3 F (36.8 C) (Oral)   Resp 19   Ht 5\' 3"  (1.6 m)   Wt 61.2 kg   SpO2 96%   BMI 23.90 kg/m   Physical Exam Vitals and nursing note reviewed.  Constitutional:      General: He is not in acute distress.    Appearance: Normal appearance. He is well-developed. He is not ill-appearing, toxic-appearing or diaphoretic.  HENT:     Head: Normocephalic and atraumatic.     Right Ear: External ear normal.     Left Ear: External ear normal.     Nose: Nose normal.     Mouth/Throat:     Mouth: Mucous membranes are moist.  Eyes:     Extraocular Movements: Extraocular movements intact.     Conjunctiva/sclera: Conjunctivae normal.  Cardiovascular:     Rate and Rhythm: Normal rate and regular rhythm.  Pulmonary:     Effort: Pulmonary effort is normal. No respiratory distress.  Abdominal:     General: There is no distension.     Palpations: Abdomen is soft.  Musculoskeletal:        General: Normal range of motion.     Cervical back: Normal range of motion and neck supple.  Skin:    General: Skin is warm and dry.     Capillary Refill: Capillary refill takes less than 2 seconds.     Coloration: Skin is not jaundiced or pale.  Neurological:     General: No focal deficit present.     Mental Status: He is alert and oriented to person, place, and time.  Psychiatric:        Mood and Affect: Mood normal.        Behavior: Behavior normal.        Thought Content: Thought content normal.        Judgment: Judgment normal.     Procedures  Procedures  ED Course / MDM   Clinical Course as of 11/17/22 0748  Mon Nov 17, 2022  0528 CBC is normal. BMP without signs of AKI.   [CS]  0555 UA consistent with UTI. CT is pending. Will also plan [place] of scrotum this AM.  [CS]  1610 I personally viewed the images from radiology studies and agree with radiologist interpretation: CT neg for stone. Bladder wall thickening consistent with UTI. [CS]  0702 Care of the patient will be signed out to the oncoming team at the change of shift pending scrotal US.  [CS]    Clinical Course User Index [CS] Pollyann Savoy, MD   Medical Decision Making Amount and/or Complexity of Data Reviewed Labs: ordered. Radiology: ordered.  Risk Prescription drug management.   On assessment, patient is resting comfortably.  Endorses ongoing pain in area of left testicle.  Ultrasound shows slightly inhomogeneous echogenicity in left testis with slightly enlarged left epididymis.  Patient to be treated for epydidimoorchitis.  Levaquin was prescribed.  Patient was given Toradol and Percocet for ongoing analgesia while in the ED.  He was advised to follow-up with urology.  He was discharged in stable condition.       Gloris Manchester, MD 11/17/22 [phone]

## 2022-11-17 NOTE — ED Triage Notes (Signed)
Pt c/o left groin pain, states he has not been able to urinate since yesterday morning.   Hx of prostate cancer

## 2022-11-17 NOTE — ED Notes (Signed)
Patient sitting upright on stretcher with eyes closed. Respirations even and unlabored. NAD.

## 2022-11-18 ENCOUNTER — Encounter (HOSPITAL_COMMUNITY): Payer: Self-pay

## 2022-11-18 ENCOUNTER — Emergency Department (HOSPITAL_COMMUNITY)
Admission: EM | Admit: 2022-11-18 | Discharge: 2022-11-19 | Disposition: A | Discharge status: Home / Self Care | Payer: Medicare HMO | Attending: Emergency Medicine | Admitting: Emergency Medicine

## 2022-11-18 ENCOUNTER — Other Ambulatory Visit: Payer: Self-pay

## 2022-11-18 DIAGNOSIS — N50812 Left testicular pain: Secondary | ICD-10-CM | POA: Diagnosis not present

## 2022-11-18 DIAGNOSIS — N3000 Acute cystitis without hematuria: Secondary | ICD-10-CM | POA: Insufficient documentation

## 2022-11-18 DIAGNOSIS — K4090 Unilateral inguinal hernia, without obstruction or gangrene, not specified as recurrent: Secondary | ICD-10-CM | POA: Diagnosis not present

## 2022-11-18 DIAGNOSIS — N451 Epididymitis: Secondary | ICD-10-CM | POA: Diagnosis not present

## 2022-11-18 LAB — CBC
HCT: 48.1 % (ref 39.0–52.0)
Hemoglobin: 15.9 g/dL (ref 13.0–17.0)
MCH: 30.3 pg (ref 26.0–34.0)
MCHC: 33.1 g/dL (ref 30.0–36.0)
MCV: 91.6 fL (ref 80.0–100.0)
Platelets: 221 10*3/uL (ref 150–400)
RBC: 5.25 MIL/uL (ref 4.22–5.81)
RDW: 14.4 % (ref 11.5–15.5)
WBC: 13.2 10*3/uL — ABNORMAL HIGH (ref 4.0–10.5)
nRBC: 0 % (ref 0.0–0.2)

## 2022-11-18 LAB — COMPREHENSIVE METABOLIC PANEL
ALT: 18 U/L (ref 0–44)
AST: 15 U/L (ref 15–41)
Albumin: 3.8 g/dL (ref 3.5–5.0)
Alkaline Phosphatase: 59 U/L (ref 38–126)
Anion gap: 10 (ref 5–15)
BUN: 20 mg/dL (ref 8–23)
CO2: 25 mmol/L (ref 22–32)
Calcium: 9.3 mg/dL (ref 8.9–10.3)
Chloride: 97 mmol/L — ABNORMAL LOW (ref 98–111)
Creatinine, Ser: 0.72 mg/dL (ref 0.61–1.24)
GFR, Estimated: >60 mL/min (ref >60)
Glucose, Bld: 177 mg/dL — ABNORMAL HIGH (ref 70–99)
Potassium: 3.7 mmol/L (ref 3.5–5.1)
Sodium: 132 mmol/L — ABNORMAL LOW (ref 135–145)
Total Bilirubin: 0.9 mg/dL (ref 0.3–1.2)
Total Protein: 7.2 g/dL (ref 6.5–8.1)

## 2022-11-18 LAB — LIPASE, BLOOD: Lipase: 33 U/L (ref 11–51)

## 2022-11-18 LAB — URINALYSIS, ROUTINE W REFLEX MICROSCOPIC
Bacteria, UA: NONE SEEN
Bilirubin Urine: NEGATIVE
Glucose, UA: >=500 mg/dL — AB
Ketones, ur: 5 mg/dL — AB
Leukocytes,Ua: NEGATIVE
Nitrite: NEGATIVE
Protein, ur: NEGATIVE mg/dL
Specific Gravity, Urine: 1.023 (ref 1.005–1.030)
pH: 5 (ref 5.0–8.0)

## 2022-11-18 MED ORDER — CEPHALEXIN 500 MG PO CAPS: 500.0000 mg | ORAL_CAPSULE | Freq: Four times a day (QID) | ORAL | 0 refills | Status: DC

## 2022-11-18 MED ORDER — ONDANSETRON 4 MG PO TBDP
4.0000 mg | ORAL_TABLET | Freq: Once | ORAL | Status: AC
  Administered 2022-11-18: 4 mg via ORAL
  Filled 2022-11-18: qty 1

## 2022-11-18 NOTE — ED Notes (Signed)
Pt instructed on clean catch urine, advised to use OB cleansing wipes x3, use specimen cup and remaining urine may be collected in urinal, pt and son verbalized understanding.

## 2022-11-18 NOTE — Discharge Instructions (Addendum)
Continue the antibiotic antibiotic levofloxacin 500 mg daily for 10 days.  I was wrong that you were on Cipro you have only been taking Levaquin.  Also for the concern for the urinary tract infection also take Keflex 500 mg every 6 hours for the next 7 days this can be adjusted once the urine culture sensitivity is known.  Make an appointment to follow-up with your regular doctor.  Make an appointment to follow-up with urology.  Make an appointment to follow-up with general surgery.  Information provided above.  Urology is for the epididymitis General surgery is for the hernia.  And regular doctors to help follow-up for the urinary tract infection.

## 2022-11-18 NOTE — ED Triage Notes (Signed)
Pt presents with left flank pain that was gotten worse over last few days. Pt was seen here the other day and was diagnosed with a UTI. Endorses N/V.

## 2022-11-18 NOTE — ED Provider Notes (Signed)
[facility] AT [facility] Provider Note   CSN: [id number] Arrival date & time: 11/18/22  1914     History  Chief Complaint  Patient presents with   Flank Pain    Left    Chase [name] is a 72 y.o. male.  Patient seen in the early morning on June 17.  Patient with a complaint of left testicle pain.  Workup included ultrasound of the testicle without evidence of torsion.  Did raise some concern for possible epididymitis.  Had also had CT scan of the abdomen which showed evidence of a small left inguinal hernia.  Patient is already had a right inguinal hernia repair in the past.  Patient without any nausea vomiting no significant abdominal pain most of his discomfort is in the left testicle.  Patient was discharged on Levaquin for 10 days.  Also urinalysis did not have any bacteria but is growing a colony of Staph aureus.  Patient's son used as an Equities trader.       Home Medications Prior to Admission medications   Medication Sig Start Date End Date Taking? Authorizing Provider  cephALEXin (KEFLEX) 500 MG capsule Take 1 capsule (500 mg total) by mouth 4 (four) times daily. 11/18/22  Yes Vanetta Mulders, MD  atorvastatin (LIPITOR) 40 MG tablet Take 1 tablet (40 mg total) by mouth every evening. 11/18/21   Dettinger, Elige Radon, MD  blood glucose meter kit and supplies KIT Dispense based on patient and insurance preference. Use up to four times daily as directed. 02/01/21   Dettinger, Elige Radon, MD  Blood Glucose Monitoring Suppl (ONETOUCH VERIO) w/Device KIT Take 1 each by mouth in the morning and at bedtime. Use to test blood sugar daily 08/01/20   Dettinger, Elige Radon, MD  empagliflozin (JARDIANCE) 25 MG TABS tablet Take 1 tablet (25 mg total) by mouth daily before breakfast. 11/18/21   Dettinger, Elige Radon, MD  glucose blood test strip Use as instructed to test blood sugar once daily.  Please fill One touch verio test strips. 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 11/17/22 11/27/22  Gloris Manchester, MD  lisinopril (ZESTRIL) 5 MG tablet Take 1 tablet (5 mg total) by mouth daily. 11/18/21   Dettinger, Elige Radon, MD  meclizine (ANTIVERT) 25 MG tablet Take 1 tablet (25 mg total) by mouth 3 (three) times daily as needed for dizziness. 08/11/22   Dettinger, Elige Radon, MD  metFORMIN (GLUCOPHAGE) 500 MG tablet Take 2 tablets (1,000 mg total) by mouth 2 (two) times daily with a meal. 11/18/21   Dettinger, Elige Radon, MD  OneTouch Delica Lancets 33G MISC Use to check blood sugar 2 times daily. E11.69 10/17/19   Dettinger, Elige Radon, MD      Allergies    Patient has no known allergies.    Review of Systems   Review of Systems  Constitutional:  Negative for chills and fever.  HENT:  Negative for ear pain and sore throat.   Eyes:  Negative for pain and visual disturbance.  Respiratory:  Negative for cough and shortness of breath.   Cardiovascular:  Negative for chest pain and palpitations.  Gastrointestinal:  Negative for abdominal pain and vomiting.  Genitourinary:  Positive for flank pain and testicular pain. Negative for dysuria, hematuria, penile discharge and scrotal swelling.  Musculoskeletal:  Negative for arthralgias and back pain.  Skin:  Negative  for color change and rash.  Neurological:  Negative for seizures and syncope.  All other systems reviewed and are negative.   Physical Exam Updated Vital Signs BP 104/73   Pulse 78   Temp 99.6 F (37.6 C) (Oral)   Resp 16   SpO2 95%  Physical Exam Vitals and nursing note reviewed.  Constitutional:      General: He is not in acute distress.    Appearance: Normal appearance. He is well-developed.  HENT:     Head: Normocephalic and atraumatic.  Eyes:     Extraocular Movements: Extraocular movements intact.     Conjunctiva/sclera: Conjunctivae normal.     Pupils: Pupils are equal, round, and reactive to  light.  Cardiovascular:     Rate and Rhythm: Normal rate and regular rhythm.     Heart sounds: No murmur heard. Pulmonary:     Effort: Pulmonary effort is normal. No respiratory distress.     Breath sounds: Normal breath sounds.  Abdominal:     Palpations: Abdomen is soft.     Tenderness: There is no abdominal tenderness.  Genitourinary:    Penis: Normal.      Comments: Patient with tenderness to the left epididymis.  Also there is evidence of a small left inguinal hernia.  There is a scar where there was a probable hernia repair on the right side.  No discharge.  No significant adenopathy. Musculoskeletal:        General: No swelling.     Cervical back: Normal range of motion and neck supple.  Skin:    General: Skin is warm and dry.     Capillary Refill: Capillary refill takes less than 2 seconds.  Neurological:     General: No focal deficit present.     Mental Status: He is alert and oriented to person, place, and time.     Cranial Nerves: No cranial nerve deficit.  Psychiatric:        Mood and Affect: Mood normal.     ED Results / Procedures / Treatments   Labs (all labs ordered are listed, but only abnormal results are displayed) Labs Reviewed  COMPREHENSIVE METABOLIC PANEL - Abnormal; Notable for the following components:      Result Value   Sodium 132 (*)    Chloride 97 (*)    Glucose, Bld 177 (*)    All other components within normal limits  CBC - Abnormal; Notable for the following components:   WBC 13.2 (*)    All other components within normal limits  URINALYSIS, ROUTINE W REFLEX MICROSCOPIC - Abnormal; Notable for the following components:   Color, Urine STRAW (*)    Glucose, UA >=500 (*)    Hgb urine dipstick MODERATE (*)    Ketones, ur 5 (*)    All other components within normal limits  LIPASE, BLOOD    EKG None  Radiology US SCROTUM W/DOPPLER  Result Date: 11/17/2022 CLINICAL DATA:  Scrotal pain, left inguinal pain EXAM: SCROTAL ULTRASOUND DOPPLER  ULTRASOUND OF THE TESTICLES TECHNIQUE: Complete ultrasound examination of the testicles, epididymis, and other scrotal structures was performed. Color and spectral Doppler ultrasound were also utilized to evaluate blood flow to the testicles. COMPARISON:  None Available. FINDINGS: Right testicle Measurements: 4.9 x 2.6 x 3.9 cm. No mass or microlithiasis visualized. Left testicle Measurements: 3.6 x 3.3 x 3.5 cm. There is a slightly inhomogeneous echogenicity in left testis without discrete focal space-occupying lesions. Right epididymis:  Normal in size and appearance. Left epididymis:  Left epididymis is larger in size. There is slightly inhomogeneous echogenicity in left epididymis. Hydrocele:  Small bilateral Varicocele:  None visualized. Pulsed Doppler interrogation of both testes demonstrates normal low resistance arterial and venous waveforms bilaterally. IMPRESSION: There is no evidence of torsion in both testes. There is slightly inhomogeneous echogenicity in left testis without discrete space-occupying lesions. Left epididymis is slightly enlarged. Findings may suggest nonspecific mild inflammation. Small bilateral hydrocele. Electronically Signed   By: Ernie Avena M.D.   On: 11/17/2022 08:14   CT Renal Stone Study  Result Date: 11/17/2022 CLINICAL DATA:  Left groin pain. EXAM: CT ABDOMEN AND PELVIS WITHOUT CONTRAST TECHNIQUE: Multidetector CT imaging of the abdomen and pelvis was performed following the standard protocol without IV contrast. RADIATION DOSE REDUCTION: This exam was performed according to the departmental dose-optimization program which includes automated exposure control, adjustment of the mA and/or kV according to patient size and/or use of iterative reconstruction technique. COMPARISON:  02/21/2018.  PET-CT 08/03/2020 FINDINGS: Lower chest: Unremarkable. Hepatobiliary: No suspicious focal abnormality in the liver on this study without intravenous contrast. 11 mm hypodensity  subcapsular posterior right liver is similar to prior consistent with benign etiology such as a cyst. No followup imaging is recommended. There is no evidence for gallstones, gallbladder wall thickening, or pericholecystic fluid. No intrahepatic or extrahepatic biliary dilation. Pancreas: No focal mass lesion. No dilatation of the main duct. No intraparenchymal cyst. No peripancreatic edema. Spleen: No splenomegaly. No suspicious focal mass lesion. Adrenals/Urinary Tract: No adrenal nodule or mass. Bilateral well-defined homogeneous low-density renal lesions bilaterally are compatible simple cysts. No followup imaging is recommended. Some lesions in both kidneys are too small to characterize but are statistically most likely benign. 11 mm exophytic lesion posterior lower pole right kidney has attenuation higher than would be expected for a simple cyst but is stable in size since the 09/22/2019 exam, most consistent with benign etiology. No followup imaging is recommended. No evidence for urinary stone disease. No secondary changes in either kidney or ureter. Bladder is moderately distended with mild circumferential bladder wall thickening evident. Stomach/Bowel: Stomach is unremarkable. No gastric wall thickening. No evidence of outlet obstruction. Duodenum is normally positioned as is the ligament of Treitz. No small bowel wall thickening. No small bowel dilatation. The terminal ileum is normal. The appendix is normal. No gross colonic mass. No colonic wall thickening. Vascular/Lymphatic: There is moderate atherosclerotic calcification of the abdominal aorta without aneurysm. There is no gastrohepatic or hepatoduodenal ligament lymphadenopathy. No retroperitoneal or mesenteric lymphadenopathy. No pelvic sidewall lymphadenopathy. Reproductive: Fiducial markers noted in the prostate gland. Other: No intraperitoneal free fluid. Musculoskeletal: Soft tissue density in the right groin region is compatible with previous  herniorrhaphy. Small left groin hernia contains only fat. No worrisome lytic or sclerotic osseous abnormality. Posterior lumbar fusion hardware evident. IMPRESSION: 1. Small left groin hernia contains only fat. Otherwise, no specific findings to explain the patient's history of left groin pain. 2. No urinary stone disease. Mild circumferential bladder wall thickening. Cystitis could have this appearance. 3.  Aortic Atherosclerosis (ICD10-I70.0). Electronically Signed   By: Kennith Center M.D.   On: 11/17/2022 05:54    Procedures Procedures    Medications Ordered in ED Medications  ondansetron (ZOFRAN-ODT) disintegrating tablet 4 mg (4 mg Oral Given 11/18/22 1956)    ED Course/ Medical Decision Making/ A&P  Medical Decision Making Amount and/or Complexity of Data Reviewed Labs: ordered.  Risk Prescription drug management.   Patient has a urinalysis from the previous visit that is growing Staph aureus.  I think patient though however was treated with Levaquin secondary to the epididymitis on the left think that was the clinical concern.  Patient had ultrasound at that time and had CT scan of the abdomen.  Patient clinically certainly has evidence of left epididymitis.  Will have him continue the Levaquin the urine sensitivities are still pending.  Will have him start Keflex for the urinalysis.  Will have patient follow-up with urology as well.  And will have patient follow-up with general surgery for the small left inguinal hernia which is not I think involved in any of today's symptoms.  No evidence of incarceration.   Final Clinical Impression(s) / ED Diagnoses Final diagnoses:  Epididymitis, left  Left inguinal hernia  Acute cystitis without hematuria    Rx / DC Orders ED Discharge Orders          Ordered    cephALEXin (KEFLEX) 500 MG capsule  4 times daily        11/18/22 2355              Vanetta Mulders, MD 11/21/22 [phone]

## 2022-11-19 LAB — URINE CULTURE: Culture: >=100000 — AB

## 2022-11-20 ENCOUNTER — Telehealth (HOSPITAL_BASED_OUTPATIENT_CLINIC_OR_DEPARTMENT_OTHER): Payer: Self-pay | Admitting: *Deleted

## 2022-11-20 NOTE — Telephone Encounter (Signed)
Post ED Visit - Positive Culture Follow-up  Culture report reviewed by antimicrobial stewardship pharmacist: [facility] [x]  Andreas Ohm, Pharm.D. []  Celedonio Miyamoto, [address].D., BCPS AQ-ID []  Garvin Fila, Pharm.D., BCPS []  Georgina Pillion, [address].D., BCPS []  [place], [address].D., BCPS, AAHIVP []  Estella Husk, Pharm.D., BCPS, AAHIVP []  Lysle Pearl, PharmD, BCPS []  Phillips Climes, PharmD, BCPS []  Agapito Games, PharmD, BCPS []  Verlan Friends, PharmD []  Mervyn Gay, PharmD, BCPS []  Vinnie Level, PharmD  [facility] []  Len Childs, PharmD []  Greer Pickerel, PharmD []  Adalberto Cole, PharmD []  Perlie Gold, Rph []  Lonell Face) Jean Rosenthal, PharmD []  Earl Many, PharmD []  Junita Push, PharmD []  Dorna Leitz, PharmD []  Terrilee Files, PharmD []  Lynann Beaver, PharmD []  Keturah Barre, PharmD []  Loralee Pacas, PharmD []  Bernadene Person, PharmD   Positive urine culture Treated with Levofloxacin, organism sensitive to the same and no further patient follow-up is required at this time.  [facility] 11/20/2022, 11:13 AM

## 2022-11-26 ENCOUNTER — Telehealth: Payer: Self-pay | Admitting: *Deleted

## 2022-11-26 NOTE — Telephone Encounter (Signed)
Transition Care Management Unsuccessful Follow-up Telephone Call  Date of discharge and from where:  Chase [name] 11/19/2022  Attempts:  1st Attempt  Reason for unsuccessful TCM follow-up call:  Left voice message

## 2022-11-27 ENCOUNTER — Telehealth: Payer: Self-pay | Admitting: *Deleted

## 2022-11-27 NOTE — Telephone Encounter (Signed)
Transition Care Management Follow-up Telephone Call Date of discharge and from where[facility] 11/19/2022 How have you been since you were released from the hospital? Still in pain  Any questions or concerns? No  Items Reviewed: Did the pt receive and understand the discharge instructions provided? Yes  Medications obtained and verified? Yes  Other? Yes  Any new allergies since your discharge? No  Dietary orders reviewed? No Do you have support at home? Yes      Follow up appointments reviewed:  PCP Hospital f/u appt confirmed? Yes  Scheduled to see New PCP  on 12/07/2025  Are transportation arrangements needed? No  If their condition worsens, is the pt aware to call PCP or go to the Emergency Dept.? Yes Was the patient provided with contact information for the PCP's office or ED? Yes Was to pt encouraged to call back with questions or concerns? Yes

## 2022-12-02 ENCOUNTER — Ambulatory Visit (HOSPITAL_COMMUNITY)
Admission: RE | Admit: 2022-12-02 | Discharge: 2022-12-02 | Disposition: A | Discharge status: Home / Self Care | Payer: Medicare HMO | Source: Ambulatory Visit | Attending: Family Medicine | Admitting: Family Medicine

## 2022-12-02 ENCOUNTER — Encounter: Payer: Self-pay | Admitting: Family Medicine

## 2022-12-02 ENCOUNTER — Ambulatory Visit (INDEPENDENT_AMBULATORY_CARE_PROVIDER_SITE_OTHER): Payer: Medicare HMO | Admitting: Family Medicine

## 2022-12-02 VITALS — BP 111/72 | HR 86 | Temp 97.2°F | Ht 63.0 in | Wt 138.0 lb

## 2022-12-02 DIAGNOSIS — N3090 Cystitis, unspecified without hematuria: Secondary | ICD-10-CM | POA: Insufficient documentation

## 2022-12-02 DIAGNOSIS — K4090 Unilateral inguinal hernia, without obstruction or gangrene, not specified as recurrent: Secondary | ICD-10-CM | POA: Diagnosis not present

## 2022-12-02 DIAGNOSIS — N5089 Other specified disorders of the male genital organs: Secondary | ICD-10-CM

## 2022-12-02 DIAGNOSIS — N433 Hydrocele, unspecified: Secondary | ICD-10-CM | POA: Diagnosis not present

## 2022-12-02 DIAGNOSIS — N503 Cyst of epididymis: Secondary | ICD-10-CM | POA: Diagnosis not present

## 2022-12-02 LAB — URINALYSIS, COMPLETE
Bilirubin, UA: NEGATIVE
Leukocytes,UA: NEGATIVE
Nitrite, UA: NEGATIVE
Protein,UA: NEGATIVE
Specific Gravity, UA: 1.01 (ref 1.005–1.030)
Urobilinogen, Ur: 1 mg/dL (ref 0.2–1.0)
pH, UA: 6.5 (ref 5.0–7.5)

## 2022-12-02 LAB — MICROSCOPIC EXAMINATION
Bacteria, UA: NONE SEEN
Renal Epithel, UA: NONE SEEN /hpf

## 2022-12-02 MED ORDER — KETOROLAC TROMETHAMINE 60 MG/2ML IM SOLN
60.0000 mg | Freq: Once | INTRAMUSCULAR | Status: AC
Start: 2022-12-02 — End: 2022-12-02
  Administered 2022-12-02: 60 mg via INTRAMUSCULAR

## 2022-12-02 NOTE — Progress Notes (Signed)
Subjective:  Patient ID: Chase [name], male    DOB: 1950/08/21  Age: 72 y.o. MRN: [id number]  CC: Cystitis and ER FOLLOW UP   HPI Chase [name] presents for continued severe pain in the left groin. He now has increased pain and swelling in the scrotum with left sided abdominal pain to the costal margin from the groin. He was seen recently in the E.D. X 2. Scrotal US showed possible epididymitis Took abx for a week, but continued to get worse. . Denies dysuria.      12/02/2022   11:00 AM 11/13/2022   10:41 AM 08/11/2022   10:49 AM  Depression screen PHQ 2/9  Decreased Interest 3 3 3   Down, Depressed, Hopeless 2 3 2   PHQ - 2 Score 5 6 5   Altered sleeping 2 1 3   Tired, decreased energy 3 3 3   Change in appetite 2 2 2   Feeling bad or failure about yourself  1 1 2   Trouble concentrating 1 2 2   Moving slowly or fidgety/restless 2 0 1  Suicidal thoughts 1 1 1   PHQ-9 Score 17 16 19   Difficult doing work/chores Not difficult at all Somewhat difficult Somewhat difficult    History Chase [name] has a past medical history of Arthritis, Cancer (HCC), Depression, Diabetes mellitus without complication (HCC), Headache, High cholesterol, Hypertension, Inguinal hernia, Neck pain, Numbness and tingling in hands, and Urinary frequency.   He has a past surgical history that includes Shoulder arthroscopy (Right); Anterior cervical decomp/discectomy fusion (N/A, 04/04/2015); UPPER EXTREMITY VENOGRAPHY (N/A, 07/22/2016); Back surgery; Inguinal hernia repair (Right, 12/22/2018); Colonoscopy with propofol (N/A, 03/22/2019); and polypectomy (03/22/2019).   His family history is not on file.He reports that he quit smoking about 4 years ago. His smoking use included cigarettes. He has a 10.00 pack-year smoking history. He has never used smokeless tobacco. He reports that he does not currently use alcohol. He reports that he does not use drugs.    ROS Review of Systems  Constitutional:  Negative for fever.   Respiratory:  Negative for shortness of breath.   Cardiovascular:  Negative for chest pain.  Gastrointestinal:  Positive for abdominal pain.  Genitourinary:  Positive for testicular pain. Negative for dysuria and penile discharge.  Musculoskeletal:  Negative for arthralgias.  Skin:  Negative for rash.    Objective:  BP 111/72   Pulse 86   Temp (!) 97.2 F (36.2 C)   Ht 5\' 3"  (1.6 m)   Wt 138 lb (62.6 kg)   SpO2 98%   BMI 24.45 kg/m   BP Readings from Last 3 Encounters:  12/02/22 111/72  11/19/22 106/74  11/17/22 105/73    Wt Readings from Last 3 Encounters:  12/02/22 138 lb (62.6 kg)  11/17/22 134 lb 14.7 oz (61.2 kg)  11/13/22 135 lb (61.2 kg)     Physical Exam Vitals reviewed.  Constitutional:      Appearance: He is well-developed.  HENT:     Head: Normocephalic and atraumatic.     Right Ear: External ear normal.     Left Ear: External ear normal.     Mouth/Throat:     Pharynx: No oropharyngeal exudate or posterior oropharyngeal erythema.  Eyes:     Pupils: Pupils are equal, round, and reactive to light.  Cardiovascular:     Rate and Rhythm: Normal rate and regular rhythm.     Heart sounds: No murmur heard. Pulmonary:     Effort: No respiratory distress.     Breath  sounds: Normal breath sounds.  Abdominal:     Hernia: A hernia is present. Hernia is present in the left inguinal area.  Genitourinary:    Pubic Area: No rash.      Penis: Normal.      Testes:        Right: Mass not present.        Left: Tenderness and swelling present.     Epididymis:     Right: Normal.     Left: Inflamed and enlarged. Tenderness present.     Tanner stage (genital): 5.     Comments: Left sided swelling is indurated and exquisitely tender. Testicle palpated at approximately 3X normal size comared to the right Musculoskeletal:     Cervical back: Normal range of motion and neck supple.  Neurological:     Mental Status: He is alert and oriented to person, place, and time.        Assessment & Plan:   Chase [name] was seen today for cystitis and er follow up.  Diagnoses and all orders for this visit:  Cystitis -     Urinalysis, Complete -     Urine Culture -     US SCROTUM W/DOPPLER; Future  Testicle swelling -     US SCROTUM W/DOPPLER; Future -     Ambulatory referral to Urology -     ketorolac (TORADOL) injection 60 mg  Other orders -     Microscopic Examination    [place] report notes there is no torsion. Positive for epidydimitis, hydrocele andLIH Urgent Urology referral made.   I have discontinued Lenward Dales Guerrero's cephALEXin. I am also having him maintain his glucose blood, OneTouch Delica Lancets 33G, OneTouch Verio, blood glucose meter kit and supplies, lisinopril, metFORMIN, empagliflozin, atorvastatin, and meclizine. We administered ketorolac.  Allergies as of 12/02/2022   No Known Allergies      Medication List        Accurate as of December 02, 2022  6:56 PM. If you have any questions, ask your nurse or doctor.          STOP taking these medications    cephALEXin 500 MG capsule Commonly known as: KEFLEX Stopped by: Mechele Claude, MD       TAKE these medications    atorvastatin 40 MG tablet Commonly known as: LIPITOR Take 1 tablet (40 mg total) by mouth every evening.   blood glucose meter kit and supplies Kit Dispense based on patient and insurance preference. Use up to four times daily as directed.   empagliflozin 25 MG Tabs tablet Commonly known as: Jardiance Take 1 tablet (25 mg total) by mouth daily before breakfast.   glucose blood test strip Use as instructed to test blood sugar once daily.  Please fill One touch verio test strips. DX:E11.69   lisinopril 5 MG tablet Commonly known as: ZESTRIL Take 1 tablet (5 mg total) by mouth daily.   meclizine 25 MG tablet Commonly known as: ANTIVERT Take 1 tablet (25 mg total) by mouth 3 (three) times daily as needed for dizziness.   metFORMIN 500 MG tablet Commonly  known as: GLUCOPHAGE Take 2 tablets (1,000 mg total) by mouth 2 (two) times daily with a meal.   OneTouch Delica Lancets 33G Misc Use to check blood sugar 2 times daily. E11.69   OneTouch Verio w/Device Kit Take 1 each by mouth in the morning and at bedtime. Use to test blood sugar daily         Follow-up: Return in  about 6 days (around 12/08/2022), or if symptoms worsen or fail to improve, for unless first seen by urology.  Mechele Claude, M.D.

## 2022-12-03 DIAGNOSIS — N50812 Left testicular pain: Secondary | ICD-10-CM | POA: Diagnosis not present

## 2022-12-03 DIAGNOSIS — N453 Epididymo-orchitis: Secondary | ICD-10-CM | POA: Diagnosis not present

## 2022-12-03 DIAGNOSIS — R8271 Bacteriuria: Secondary | ICD-10-CM | POA: Diagnosis not present

## 2022-12-04 LAB — URINE CULTURE

## 2022-12-08 ENCOUNTER — Ambulatory Visit: Payer: Medicare HMO | Admitting: Urology

## 2022-12-08 DIAGNOSIS — Z87440 Personal history of urinary (tract) infections: Secondary | ICD-10-CM | POA: Insufficient documentation

## 2022-12-08 DIAGNOSIS — K4090 Unilateral inguinal hernia, without obstruction or gangrene, not specified as recurrent: Secondary | ICD-10-CM | POA: Insufficient documentation

## 2022-12-08 DIAGNOSIS — N529 Male erectile dysfunction, unspecified: Secondary | ICD-10-CM | POA: Insufficient documentation

## 2022-12-08 NOTE — Progress Notes (Deleted)
Name: Chase [name] DOB: Sep 12, 1950 MRN: [id number]  History of Present Illness: Chase [name] Pulse is a 72 y.o. male who presents today as a new patient at [facility] [place]. All available relevant medical records have been reviewed.  - GU History: 1. Prostate cancer, metastatic. Followed by Dr. Marlou Porch at [facility] (last visit there was 08/25/2022).  2. LUTS (urinary hesitancy).  3. Erectile dysfunction.  Recent history:  > 11/17/2022:  - Seen in ER for UTI and left epididymitis.  - Urine culture positive for >100k Staphylococcus aureus (pan-sensitive).  - CT stone showed bilateral simple renal cysts; no GU stones, masses, or hydronephrosis; positive for small left groin hernia ("contains only fat. Otherwise, no specific findings to explain the patient's history of left groin pain").  - Discharged with prescription for Levaquin to treat left epididymitis.   > 11/18/2022:  - Seen in ER for left groin pain.  - Levaquin continued to treat left epididymitis and also started on Keflex for UTI.  - Advised to follow up with General Surgery for the small left inguinal hernia.  > 12/02/2022:  - Seen by PCP for "continued severe pain in the left groin. He now has increased pain and swelling in the scrotum with left sided abdominal pain to the costal margin from the groin. He was seen recently in the E.D. X 2. Scrotal US showed possible epididymitis Took abx for a week, but continued to get worse."  - Urine culture grew 10-25k Staphylococcus mitis/oralis group (asymptomatic for dysuria; Keflex discontinued).  - Scrotal / testicular ultrasound showed small bilateral hydroceles (L>R), small left varicocele, "persistent asymmetric enlargement of the left epididymis suspicious for epididymitis. Portion of the thickened extra testicular left scrotal tissue may be fat containing hernia. Further evaluation with contrast-enhanced pelvic CT would be beneficial." No testicular  torsion.  Today: ***  He reports chief complaint of ***  He reports *** urinary stream. He {Actions; denies-reports:120008} urinary hesitancy, urgency, frequency, dysuria, gross hematuria, straining to void, or sensations of incomplete emptying.  Pain began *** ago and has been ***intermittent/***continuous. Pain is described as *** and ***/10. Aggravating factors include ***. Relieving factors include ***.   Fall Screening: Do you usually have a device to assist in your mobility? {yes/no:20286} ***cane / ***walker / ***wheelchair  Medications: Current Outpatient Medications  Medication Sig Dispense Refill   atorvastatin (LIPITOR) 40 MG tablet Take 1 tablet (40 mg total) by mouth every evening. 90 tablet 3   blood glucose meter kit and supplies KIT Dispense based on patient and insurance preference. Use up to four times daily as directed. 1 each 0   Blood Glucose Monitoring Suppl (ONETOUCH VERIO) w/Device KIT Take 1 each by mouth in the morning and at bedtime. Use to test blood sugar daily 1 kit 1   empagliflozin (JARDIANCE) 25 MG TABS tablet Take 1 tablet (25 mg total) by mouth daily before breakfast. 90 tablet 3   glucose blood test strip Use as instructed to test blood sugar once daily.  Please fill One touch verio test strips. DX:E11.69 100 each 12   lisinopril (ZESTRIL) 5 MG tablet Take 1 tablet (5 mg total) by mouth daily. 90 tablet 3   meclizine (ANTIVERT) 25 MG tablet Take 1 tablet (25 mg total) by mouth 3 (three) times daily as needed for dizziness. 30 tablet 1   metFORMIN (GLUCOPHAGE) 500 MG tablet Take 2 tablets (1,000 mg total) by mouth 2 (two) times daily with a meal. 360 tablet 3  OneTouch Delica Lancets 33G MISC Use to check blood sugar 2 times daily. E11.69 100 each 4   No current facility-administered medications for this visit.    Allergies: No Known Allergies  Past Medical History:  Diagnosis Date   Arthritis    Cancer (HCC)    prostate 2017   Depression     Diabetes mellitus without complication (HCC)    Headache    High cholesterol    Hypertension    Inguinal hernia    rt   Neck pain    Numbness and tingling in hands    Urinary frequency    Past Surgical History:  Procedure Laterality Date   ANTERIOR CERVICAL DECOMP/DISCECTOMY FUSION N/A 04/04/2015   Procedure: ANTERIOR CERVICAL DECOMPRESSION/DISCECTOMY FUSION PLATING BONEGRAFT CERVICAL FOUR-FIVE CERVICAL FIVE-SIX;  Surgeon: Coletta Memos, MD;  Location: [facility];  Service: Neurosurgery;  Laterality: N/A;   BACK SURGERY     lower back with Dr. Franky Macho   COLONOSCOPY WITH PROPOFOL N/A 03/22/2019   Procedure: COLONOSCOPY WITH PROPOFOL;  Surgeon: West Bali, MD;  Location: [facility];  Service: Endoscopy;  Laterality: N/A;  8:30am   INGUINAL HERNIA REPAIR Right 12/22/2018   Procedure: HERNIA REPAIR INGUINAL ADULT WITH MESH;  Surgeon: Franky Macho, MD;  Location: [facility];  Service: General;  Laterality: Right;   POLYPECTOMY  03/22/2019   Procedure: POLYPECTOMY;  Surgeon: West Bali, MD;  Location: [facility];  Service: Endoscopy;;  Cold snare polypectomy cecal, hepatic flexure polyps x 2,descending colon  polyps x 3,  and rectal polyps x 3    SHOULDER ARTHROSCOPY Right    UPPER EXTREMITY VENOGRAPHY N/A 07/22/2016   Procedure: Upper Extremity Venography - bilateral arm;  Surgeon: Nada Libman, MD;  Location: [facility];  Service: Cardiovascular;  Laterality: N/A;   Family History  Problem Relation Age of Onset   Colon cancer Neg Hx    Colon polyps Neg Hx    Social History   Socioeconomic History   Marital status: Married    Spouse name: Not on file   Number of children: 11   Years of education: Not on file   Highest education level: Not on file  Occupational History   Not on file  Tobacco Use   Smoking status: Former    Packs/day: 0.25    Years: 40.00    Additional pack years: 0.00    Total pack years: 10.00    Types: Cigarettes    Quit date:  12/19/2017    Years since quitting: 4.9   Smokeless tobacco: Never   Tobacco comments:    quit 11/2017 (as of 12/08/2018)  Vaping Use   Vaping Use: Never used  Substance and Sexual Activity   Alcohol use: Not Currently    Comment: no ETOH since since 2016 (as of 12/08/2018)   Drug use: No   Sexual activity: Not Currently  Other Topics Concern   Not on file  Social History Narrative   Right handed   livs with wife in a one story   Social Determinants of Health   Financial Resource Strain: Low Risk  (12/04/2021)   Overall Financial Resource Strain (CARDIA)    Difficulty of Paying Living Expenses: Not hard at all  Food Insecurity: No Food Insecurity (12/04/2021)   Hunger Vital Sign    Worried About Running Out of Food in the Last Year: Never true    Ran Out of Food in the Last Year: Never true  Transportation  Needs: No Transportation Needs (12/04/2021)   PRAPARE - Administrator, Civil Service (Medical): No    Lack of Transportation (Non-Medical): No  Physical Activity: Insufficiently Active (12/04/2021)   Exercise Vital Sign    Days of Exercise per Week: 3 days    Minutes of Exercise per Session: 20 min  Stress: Stress Concern Present (12/04/2021)   [employer] of Occupational Health - Occupational Stress Questionnaire    Feeling of Stress : Rather much  Social Connections: Moderately Isolated (12/04/2021)   Social Connection and Isolation Panel [NHANES]    Frequency of Communication with Friends and Family: More than three times a week    Frequency of Social Gatherings with Friends and Family: More than three times a week    Attends Religious Services: Never    Database administrator or Organizations: No    Attends Banker Meetings: Never    Marital Status: Married  Catering manager Violence: Not At Risk (12/04/2021)   Humiliation, Afraid, Rape, and Kick questionnaire    Fear of Current or Ex-Partner: No    Emotionally Abused: No    Physically Abused: No     Sexually Abused: No    SUBJECTIVE  Review of Systems Constitutional: Patient ***denies any unintentional weight loss or change in strength lntegumentary: Patient ***denies any rashes or pruritus Eyes: Patient denies ***dry eyes ENT: Patient ***denies dry mouth Cardiovascular: Patient ***denies chest pain or syncope Respiratory: Patient ***denies shortness of breath Gastrointestinal: Patient ***denies nausea, vomiting, constipation, or diarrhea Musculoskeletal: Patient ***denies muscle cramps or weakness Neurologic: Patient ***denies convulsions or seizures Psychiatric: Patient ***denies memory problems Allergic/Immunologic: Patient ***denies recent allergic reaction(s) Hematologic/Lymphatic: Patient denies bleeding tendencies Endocrine: Patient ***denies heat/cold intolerance  GU: As per HPI.  OBJECTIVE There were no vitals filed for this visit. There is no height or weight on file to calculate BMI.  Physical Examination  Constitutional: ***No obvious distress; patient is ***non-toxic appearing  Cardiovascular: ***No visible lower extremity edema.  Respiratory: The patient does ***not have audible wheezing/stridor; respirations do ***not appear labored  Gastrointestinal: Abdomen ***non-distended Musculoskeletal: ***Normal ROM of UEs  Skin: ***No obvious rashes/open sores  Neurologic: CN 2-12 grossly ***intact Psychiatric: Answered questions ***appropriately with ***normal affect  Hematologic/Lymphatic/Immunologic: ***No obvious bruises or sites of spontaneous bleeding  UA: {Desc; negative/positive:13464} for *** WBC/hpf, *** RBC/hpf, bacteria (***) *** nitrites, *** leukocytes, *** blood PVR: *** ml  ASSESSMENT No diagnosis found. ***  Will plan for follow up in *** months or sooner if needed. Pt verbalized understanding and agreement. All questions were answered.  PLAN Advised the following: *** ***No follow-ups on file.  No orders of the defined types were placed  in this encounter.   It has been explained that the patient is to follow regularly with their PCP in addition to all other providers involved in their care and to follow instructions provided by these respective offices. Patient advised to contact urology clinic if any urologic-pertaining questions, concerns, new symptoms or problems arise in the interim period.  There are no Patient Instructions on file for this visit.  Electronically signed by:  Donnita Falls, MSN, FNP-C, CUNP 12/08/2022 8:29 AM

## 2022-12-09 ENCOUNTER — Ambulatory Visit: Payer: Medicare HMO

## 2022-12-09 VITALS — Ht 61.0 in | Wt 138.0 lb

## 2022-12-09 DIAGNOSIS — Z0100 Encounter for examination of eyes and vision without abnormal findings: Secondary | ICD-10-CM

## 2022-12-09 DIAGNOSIS — Z0000 Encounter for general adult medical examination without abnormal findings: Secondary | ICD-10-CM

## 2022-12-09 DIAGNOSIS — Z1211 Encounter for screening for malignant neoplasm of colon: Secondary | ICD-10-CM

## 2022-12-09 NOTE — Progress Notes (Signed)
Subjective:   Chase [name] is a 72 y.o. male who presents for Medicare Annual/Subsequent preventive examination.  Visit Complete: Virtual  I connected with  Chase [name] on 12/09/22 by a audio enabled telemedicine application and verified that I am speaking with the correct person using two identifiers.  Patient Location: Home  Provider Location: Home Office  I discussed the limitations of evaluation and management by telemedicine. The patient expressed understanding and agreed to proceed.  Patient Medicare AWV questionnaire was completed by the patient on 12/09/2022; I have confirmed that all information answered by patient is correct and no changes since this date.  Review of Systems    Nutrition Risk Assessment:  Has the patient had any N/V/D within the last 2 months?  No  Does the patient have any non-healing wounds?  No  Has the patient had any unintentional weight loss or weight gain?  No   Diabetes:  Is the patient diabetic?  Yes  If diabetic, was a CBG obtained today?  No  Did the patient bring in their glucometer from home?  No  How often do you monitor your CBG's? Daily .   Financial Strains and Diabetes Management:  Are you having any financial strains with the device, your supplies or your medication? No .  Does the patient want to be seen by Chronic Care Management for management of their diabetes?  No  Would the patient like to be referred to a Nutritionist or for Diabetic Management?  No   Diabetic Exams:  Diabetic Eye Exam: Overdue for diabetic eye exam. Pt has been advised about the importance in completing this exam. Patient advised to call and schedule an eye exam. Diabetic Foot Exam: Overdue, Pt has been advised about the importance in completing this exam. Pt is scheduled for diabetic foot exam on next office visit .  Cardiac Risk Factors include: advanced age (>12men, >45 women);dyslipidemia;male gender;hypertension;diabetes  mellitus;sedentary lifestyle     Objective:    Today's Vitals   12/09/22 1527  Weight: 138 lb (62.6 kg)  Height: 5\' 1"  (1.549 m)   Body mass index is 26.07 kg/m.     12/09/2022    3:33 PM 11/18/2022    7:34 PM 11/17/2022    3:52 AM 12/04/2021    8:52 AM 01/28/2021    8:40 AM 11/29/2020    7:58 AM 09/17/2020    8:55 AM  Advanced Directives  Does Patient Have a Medical Advance Directive? No No No No Yes No No  Would patient like information on creating a medical advance directive? Yes (MAU/Ambulatory/Procedural Areas - Information given)   No - Patient declined   No - Patient declined    Current Medications (verified) Outpatient Encounter Medications as of 12/09/2022  Medication Sig   atorvastatin (LIPITOR) 40 MG tablet Take 1 tablet (40 mg total) by mouth every evening.   blood glucose meter kit and supplies KIT Dispense based on patient and insurance preference. Use up to four times daily as directed.   Blood Glucose Monitoring Suppl (ONETOUCH VERIO) w/Device KIT Take 1 each by mouth in the morning and at bedtime. Use to test blood sugar daily   empagliflozin (JARDIANCE) 25 MG TABS tablet Take 1 tablet (25 mg total) by mouth daily before breakfast.   glucose blood test strip Use as instructed to test blood sugar once daily.  Please fill One touch verio test strips. DX:E11.69   lisinopril (ZESTRIL) 5 MG tablet Take 1 tablet (5 mg total) by  mouth daily.   meclizine (ANTIVERT) 25 MG tablet Take 1 tablet (25 mg total) by mouth 3 (three) times daily as needed for dizziness.   metFORMIN (GLUCOPHAGE) 500 MG tablet Take 2 tablets (1,000 mg total) by mouth 2 (two) times daily with a meal.   OneTouch Delica Lancets 33G MISC Use to check blood sugar 2 times daily. E11.69   No facility-administered encounter medications on file as of 12/09/2022.    Allergies (verified) Patient has no known allergies.   History: Past Medical History:  Diagnosis Date   Arthritis    Cancer (HCC)    prostate  2017   Depression    Diabetes mellitus without complication (HCC)    Headache    High cholesterol    Hypertension    Inguinal hernia    rt   Neck pain    Numbness and tingling in hands    Urinary frequency    Past Surgical History:  Procedure Laterality Date   ANTERIOR CERVICAL DECOMP/DISCECTOMY FUSION N/A 04/04/2015   Procedure: ANTERIOR CERVICAL DECOMPRESSION/DISCECTOMY FUSION PLATING BONEGRAFT CERVICAL FOUR-FIVE CERVICAL FIVE-SIX;  Surgeon: Coletta Memos, MD;  Location: [facility];  Service: Neurosurgery;  Laterality: N/A;   BACK SURGERY     lower back with Dr. Franky Macho   COLONOSCOPY WITH PROPOFOL N/A 03/22/2019   Procedure: COLONOSCOPY WITH PROPOFOL;  Surgeon: West Bali, MD;  Location: [facility];  Service: Endoscopy;  Laterality: N/A;  8:30am   INGUINAL HERNIA REPAIR Right 12/22/2018   Procedure: HERNIA REPAIR INGUINAL ADULT WITH MESH;  Surgeon: Franky Macho, MD;  Location: [facility];  Service: General;  Laterality: Right;   POLYPECTOMY  03/22/2019   Procedure: POLYPECTOMY;  Surgeon: West Bali, MD;  Location: [facility];  Service: Endoscopy;;  Cold snare polypectomy cecal, hepatic flexure polyps x 2,descending colon  polyps x 3,  and rectal polyps x 3    SHOULDER ARTHROSCOPY Right    UPPER EXTREMITY VENOGRAPHY N/A 07/22/2016   Procedure: Upper Extremity Venography - bilateral arm;  Surgeon: Nada Libman, MD;  Location: [facility];  Service: Cardiovascular;  Laterality: N/A;   Family History  Problem Relation Age of Onset   Colon cancer Neg Hx    Colon polyps Neg Hx    Social History   Socioeconomic History   Marital status: Married    Spouse name: Not on file   Number of children: 11   Years of education: Not on file   Highest education level: Not on file  Occupational History   Not on file  Tobacco Use   Smoking status: Former    Packs/day: 0.25    Years: 40.00    Additional pack years: 0.00    Total pack years: 10.00    Types: Cigarettes     Quit date: 12/19/2017    Years since quitting: 4.9   Smokeless tobacco: Never   Tobacco comments:    quit 11/2017 (as of 12/08/2018)  Vaping Use   Vaping Use: Never used  Substance and Sexual Activity   Alcohol use: Not Currently    Comment: no ETOH since since 2016 (as of 12/08/2018)   Drug use: No   Sexual activity: Not Currently  Other Topics Concern   Not on file  Social History Narrative   Right handed   livs with wife in a one story   Social Determinants of Health   Financial Resource Strain: Low Risk  (12/09/2022)   Overall Financial Resource Strain (CARDIA)  Difficulty of Paying Living Expenses: Not hard at all  Food Insecurity: No Food Insecurity (12/09/2022)   Hunger Vital Sign    Worried About Running Out of Food in the Last Year: Never true    Ran Out of Food in the Last Year: Never true  Transportation Needs: No Transportation Needs (12/09/2022)   PRAPARE - Administrator, Civil Service (Medical): No    Lack of Transportation (Non-Medical): No  Physical Activity: Inactive (12/09/2022)   Exercise Vital Sign    Days of Exercise per Week: 0 days    Minutes of Exercise per Session: 0 min  Stress: No Stress Concern Present (12/09/2022)   [employer] of Occupational Health - Occupational Stress Questionnaire    Feeling of Stress : Not at all  Social Connections: Moderately Isolated (12/09/2022)   Social Connection and Isolation Panel [NHANES]    Frequency of Communication with Friends and Family: More than three times a week    Frequency of Social Gatherings with Friends and Family: More than three times a week    Attends Religious Services: Never    Database administrator or Organizations: No    Attends Banker Meetings: Never    Marital Status: Married    Tobacco Counseling Counseling given: Not Answered Tobacco comments: quit 11/2017 (as of 12/08/2018)   Clinical Intake:  Pre-visit preparation completed: Yes  Pain : No/denies pain      Nutritional Risks: None Diabetes: Yes CBG done?: No Did pt. bring in CBG monitor from home?: No  How often do you need to have someone help you when you read instructions, pamphlets, or other written materials from your doctor or pharmacy?: 1 - Never  Interpreter Needed?: No  Information entered by :: Renie Ora, LPN   Activities of Daily Living    12/09/2022    3:33 PM  In your present state of health, do you have any difficulty performing the following activities:  Hearing? 0  Vision? 0  Difficulty concentrating or making decisions? 0  Walking or climbing stairs? 0  Dressing or bathing? 0  Doing errands, shopping? 0  Preparing Food and eating ? N  Using the Toilet? N  In the past six months, have you accidently leaked urine? N  Do you have problems with loss of bowel control? N  Managing your Medications? N  Managing your Finances? N  Housekeeping or managing your Housekeeping? N    Patient Care Team: Dettinger, Elige Radon, MD as PCP - General (Family Medicine) Coletta Memos, MD as Consulting Physician (Neurosurgery) West Bali, MD (Inactive) as Consulting Physician (Gastroenterology) Felicita Gage, RN as Oncology Nurse Navigator Drema Dallas, DO as Consulting Physician (Neurology) Crist Fat, MD as Attending Physician (Urology) Margaretmary Dys, MD as Consulting Physician (Radiation Oncology)  Indicate any recent Medical Services you may have received from other than [facility] providers in the past year (date may be approximate).     Assessment:   This is a routine wellness examination for [place].  Hearing/Vision screen Vision Screening - Comments:: Referral 12/09/2022     Dietary issues and exercise activities discussed:     Goals Addressed             This Visit's Progress    Exercise 3x per week (30 min per time)         Depression Screen    12/09/2022    3:32 PM 12/02/2022   11:00 AM 11/13/2022   10:41 AM 08/11/2022  10:49 AM 05/12/2022    11:41 AM 02/19/2022    1:48 PM 12/05/2021   10:36 AM  PHQ 2/9 Scores  PHQ - 2 Score 0 5 6 5 3  5   PHQ- 9 Score 0 17 16 19 11  18   Exception Documentation      Patient refusal     Fall Risk    12/09/2022    3:29 PM 12/02/2022   11:00 AM 11/13/2022   10:41 AM 08/11/2022   10:49 AM 05/12/2022   11:40 AM  Fall Risk   Falls in the past year? 0  0 0 1  Number falls in past yr: 0 1   1  Injury with Fall? 0 1   1  Risk for fall due to : No Fall Risks History of fall(s)   Impaired balance/gait  Follow up Falls prevention discussed Falls evaluation completed   Falls evaluation completed    MEDICARE RISK AT HOME:  Medicare Risk at Home - 12/09/22 1529     Any stairs in or around the home? No    If so, are there any without handrails? No    Home free of loose throw rugs in walkways, pet beds, electrical cords, etc? Yes    Adequate lighting in your home to reduce risk of falls? Yes    Life alert? No    Use of a cane, walker or w/c? No    Grab bars in the bathroom? No    Shower chair or bench in shower? No    Elevated toilet seat or a handicapped toilet? No             TIMED UP AND GO:  Was the test performed?  No    Cognitive Function:    03/29/2018    2:36 PM  MMSE - Mini Mental State Exam  Orientation to time 5  Orientation to Place 5  Registration 3  Attention/ Calculation 5  Recall 3  Language- name 2 objects 2  Language- repeat 1  Language- follow 3 step command 3  Language- read & follow direction 1  Write a sentence 1  Copy design 1  Total score 30        12/09/2022    3:39 PM 12/09/2022    3:34 PM 12/04/2021    8:56 AM  6CIT Screen  What Year? 0 points 0 points 0 points  What month? 0 points 0 points 0 points  What time? 0 points 0 points 0 points  Count back from 20 0 points 0 points 0 points  Months in reverse 0 points 0 points 0 points  Repeat phrase 0 points 0 points 10 points  Total Score 0 points 0 points 10 points    Immunizations Immunization  History  Administered Date(s) Administered   Fluad Quad(high Dose 65+) 04/30/2020, 02/19/2022   Influenza-Unspecified 06/02/2018, 02/04/2021   Moderna Sars-Covid-2 Vaccination 07/27/2019, 08/24/2019   PFIZER(Purple Top)SARS-COV-2 Vaccination 05/01/2020   Pneumococcal Conjugate-13 03/29/2018   Pneumococcal Polysaccharide-23 10/25/2019   Tdap 03/29/2018   Zoster Recombinant(Shingrix) 11/08/2020, 08/16/2021    TDAP status: Up to date  Flu Vaccine status: Up to date  Pneumococcal vaccine status: Up to date  Covid-19 vaccine status: Completed vaccines  Qualifies for Shingles Vaccine? Yes   Zostavax completed Yes   Shingrix Completed?: Yes  Screening Tests Health Maintenance  Topic Date Due   COVID-19 Vaccine (4 - 2023-24 season) 01/31/2022   Colonoscopy  03/21/2022   OPHTHALMOLOGY EXAM  11/12/2022  FOOT EXAM  11/19/2022   Hepatitis C Screening  11/13/2023 (Originally 10/01/1968)   INFLUENZA VACCINE  01/01/2023   Diabetic kidney evaluation - Urine ACR  02/20/2023   HEMOGLOBIN A1C  05/15/2023   Diabetic kidney evaluation - eGFR measurement  11/18/2023   Medicare Annual Wellness (AWV)  12/09/2023   DTaP/Tdap/Td (2 - Td or Tdap) 03/29/2028   Pneumonia Vaccine 43+ Years old  Completed   Zoster Vaccines- Shingrix  Completed   HPV VACCINES  Aged Out    Health Maintenance  Health Maintenance Due  Topic Date Due   COVID-19 Vaccine (4 - 2023-24 season) 01/31/2022   Colonoscopy  03/21/2022   OPHTHALMOLOGY EXAM  11/12/2022   FOOT EXAM  11/19/2022    Colorectal cancer screening: Referral to GI placed 12/09/2022. Pt aware the office will call re: appt.  Lung Cancer Screening: (Low Dose CT Chest recommended if Age 63-80 years, 20 pack-year currently smoking OR have quit w/in 15years.) does not qualify.   Lung Cancer Screening Referral: n/a  Additional Screening:  Hepatitis C Screening: does not qualify;   Vision Screening: Recommended annual ophthalmology exams for early  detection of glaucoma and other disorders of the eye. Is the patient up to date with their annual eye exam?  No  Who is the provider or what is the name of the office in which the patient attends annual eye exams? None referral 12/09/2022 If pt is not established with a provider, would they like to be referred to a provider to establish care? No .   Dental Screening: Recommended annual dental exams for proper oral hygiene    Community Resource Referral / Chronic Care Management: CRR required this visit?  No   CCM required this visit?  No     Plan:     I have personally reviewed and noted the following in the patient's chart:   Medical and social history Use of alcohol, tobacco or illicit drugs  Current medications and supplements including opioid prescriptions. Patient is not currently taking opioid prescriptions. Functional ability and status Nutritional status Physical activity Advanced directives List of other physicians Hospitalizations, surgeries, and ER visits in previous 12 months Vitals Screenings to include cognitive, depression, and falls Referrals and appointments  In addition, I have reviewed and discussed with patient certain preventive protocols, quality metrics, and best practice recommendations. A written personalized care plan for preventive services as well as general preventive health recommendations were provided to patient.     Lorrene Reid, LPN   06/07/1094   After Visit Summary: (MyChart) Due to this being a telephonic visit, the after visit summary with patients personalized plan was offered to patient via MyChart   Nurse Notes: none

## 2022-12-09 NOTE — Patient Instructions (Signed)
[name] , Thank you for taking time to come for your Medicare Wellness Visit. I appreciate your ongoing commitment to your health goals. Please review the following plan we discussed and let me know if I can assist you in the future.   These are the goals we discussed:  Goals      Exercise 3x per week (30 min per time)     Patient Stated     12/04/2021 - No more falls, try to exercise and strengthen legs        This is a list of the screening recommended for you and due dates:  Health Maintenance  Topic Date Due   COVID-19 Vaccine (4 - 2023-24 season) 01/31/2022   Colon Cancer Screening  03/21/2022   Eye exam for diabetics  11/12/2022   Complete foot exam   11/19/2022   Hepatitis C Screening  11/13/2023*   Flu Shot  01/01/2023   Yearly kidney health urinalysis for diabetes  02/20/2023   Hemoglobin A1C  05/15/2023   Yearly kidney function blood test for diabetes  11/18/2023   Medicare Annual Wellness Visit  12/09/2023   DTaP/Tdap/Td vaccine (2 - Td or Tdap) 03/29/2028   Pneumonia Vaccine  Completed   Zoster (Shingles) Vaccine  Completed   HPV Vaccine  Aged Out  *Topic was postponed. The date shown is not the original due date.    Advanced directives: Advance directive discussed with you today. I have provided a copy for you to complete at home and have notarized. Once this is complete please bring a copy in to our office so we can scan it into your chart. Information on Advanced Care Planning can be found at [facility] [place] [facility] Advance Health Care Directives Advance Health Care Directives ([email or web address]    Conditions/risks identified: Aim for 30 minutes of exercise or brisk walking, 6-8 glasses of water, and 5 servings of fruits and vegetables each day.   Next appointment: Follow up in one year for your annual wellness visit.   Preventive Care 70 Years and Older, Male  Preventive care refers to lifestyle choices and visits with your health care provider that  can promote health and wellness. What does preventive care include? A yearly physical exam. This is also called an annual well check. Dental exams once or twice a year. Routine eye exams. Ask your health care provider how often you should have your eyes checked. Personal lifestyle choices, including: Daily care of your teeth and gums. Regular physical activity. Eating a healthy diet. Avoiding tobacco and drug use. Limiting alcohol use. Practicing safe sex. Taking low doses of aspirin every day. Taking vitamin and mineral supplements as recommended by your health care provider. What happens during an annual well check? The services and screenings done by your health care provider during your annual well check will depend on your age, overall health, lifestyle risk factors, and family history of disease. Counseling  Your health care provider may ask you questions about your: Alcohol use. Tobacco use. Drug use. Emotional well-being. Home and relationship well-being. Sexual activity. Eating habits. History of falls. Memory and ability to understand (cognition). Work and work Astronomer. Screening  You may have the following tests or measurements: Height, weight, and BMI. Blood pressure. Lipid and cholesterol levels. These may be checked every 5 years, or more frequently if you are over 46 years old. Skin check. Lung cancer screening. You may have this screening every year starting at age 65 if you have a  30-pack-year history of smoking and currently smoke or have quit within the past 15 years. Fecal occult blood test (FOBT) of the stool. You may have this test every year starting at age 38. Flexible sigmoidoscopy or colonoscopy. You may have a sigmoidoscopy every 5 years or a colonoscopy every 10 years starting at age 51. Prostate cancer screening. Recommendations will vary depending on your family history and other risks. Hepatitis C blood test. Hepatitis B blood test. Sexually  transmitted disease (STD) testing. Diabetes screening. This is done by checking your blood sugar (glucose) after you have not eaten for a while (fasting). You may have this done every 1-3 years. Abdominal aortic aneurysm (AAA) screening. You may need this if you are a current or former smoker. Osteoporosis. You may be screened starting at age 34 if you are at high risk. Talk with your health care provider about your test results, treatment options, and if necessary, the need for more tests. Vaccines  Your health care provider may recommend certain vaccines, such as: Influenza vaccine. This is recommended every year. Tetanus, diphtheria, and acellular pertussis (Tdap, Td) vaccine. You may need a Td booster every 10 years. Zoster vaccine. You may need this after age 46. Pneumococcal 13-valent conjugate (PCV13) vaccine. One dose is recommended after age 15. Pneumococcal polysaccharide (PPSV23) vaccine. One dose is recommended after age 50. Talk to your health care provider about which screenings and vaccines you need and how often you need them. This information is not intended to replace advice given to you by your health care provider. Make sure you discuss any questions you have with your health care provider. Document Released: 06/15/2015 Document Revised: 02/06/2016 Document Reviewed: 03/20/2015 Elsevier Interactive Patient Education  2017 [employer].  Fall Prevention in the Home Falls can cause injuries. They can happen to people of all ages. There are many things you can do to make your home safe and to help prevent falls. What can I do on the outside of my home? Regularly fix the edges of walkways and driveways and fix any cracks. Remove anything that might make you trip as you walk through a door, such as a raised step or threshold. Trim any bushes or trees on the path to your home. Use bright outdoor lighting. Clear any walking paths of anything that might make someone trip, such as  rocks or tools. Regularly check to see if handrails are loose or broken. Make sure that both sides of any steps have handrails. Any raised decks and porches should have guardrails on the edges. Have any leaves, snow, or ice cleared regularly. Use sand or salt on walking paths during winter. Clean up any spills in your garage right away. This includes oil or grease spills. What can I do in the bathroom? Use night lights. Install grab bars by the toilet and in the tub and shower. Do not use towel bars as grab bars. Use non-skid mats or decals in the tub or shower. If you need to sit down in the shower, use a plastic, non-slip stool. Keep the floor dry. Clean up any water that spills on the floor as soon as it happens. Remove soap buildup in the tub or shower regularly. Attach bath mats securely with double-sided non-slip rug tape. Do not have throw rugs and other things on the floor that can make you trip. What can I do in the bedroom? Use night lights. Make sure that you have a light by your bed that is easy to reach.  Do not use any sheets or blankets that are too big for your bed. They should not hang down onto the floor. Have a firm chair that has side arms. You can use this for support while you get dressed. Do not have throw rugs and other things on the floor that can make you trip. What can I do in the kitchen? Clean up any spills right away. Avoid walking on wet floors. Keep items that you use a lot in easy-to-reach places. If you need to reach something above you, use a strong step stool that has a grab bar. Keep electrical cords out of the way. Do not use floor polish or wax that makes floors slippery. If you must use wax, use non-skid floor wax. Do not have throw rugs and other things on the floor that can make you trip. What can I do with my stairs? Do not leave any items on the stairs. Make sure that there are handrails on both sides of the stairs and use them. Fix handrails  that are broken or loose. Make sure that handrails are as long as the stairways. Check any carpeting to make sure that it is firmly attached to the stairs. Fix any carpet that is loose or worn. Avoid having throw rugs at the top or bottom of the stairs. If you do have throw rugs, attach them to the floor with carpet tape. Make sure that you have a light switch at the top of the stairs and the bottom of the stairs. If you do not have them, ask someone to add them for you. What else can I do to help prevent falls? Wear shoes that: Do not have high heels. Have rubber bottoms. Are comfortable and fit you well. Are closed at the toe. Do not wear sandals. If you use a stepladder: Make sure that it is fully opened. Do not climb a closed stepladder. Make sure that both sides of the stepladder are locked into place. Ask someone to hold it for you, if possible. Clearly mark and make sure that you can see: Any grab bars or handrails. First and last steps. Where the edge of each step is. Use tools that help you move around (mobility aids) if they are needed. These include: Canes. Walkers. Scooters. Crutches. Turn on the lights when you go into a dark area. Replace any light bulbs as soon as they burn out. Set up your furniture so you have a clear path. Avoid moving your furniture around. If any of your floors are uneven, fix them. If there are any pets around you, be aware of where they are. Review your medicines with your doctor. Some medicines can make you feel dizzy. This can increase your chance of falling. Ask your doctor what other things that you can do to help prevent falls. This information is not intended to replace advice given to you by your health care provider. Make sure you discuss any questions you have with your health care provider. Document Released: 03/15/2009 Document Revised: 10/25/2015 Document Reviewed: 06/23/2014 Elsevier Interactive Patient Education  2017 [employer].

## 2022-12-10 ENCOUNTER — Encounter: Payer: Self-pay | Admitting: *Deleted

## 2022-12-12 DIAGNOSIS — C61 Malignant neoplasm of prostate: Secondary | ICD-10-CM | POA: Diagnosis not present

## 2022-12-12 DIAGNOSIS — N50812 Left testicular pain: Secondary | ICD-10-CM | POA: Diagnosis not present

## 2022-12-30 ENCOUNTER — Encounter: Payer: Self-pay | Admitting: Nurse Practitioner

## 2022-12-30 ENCOUNTER — Ambulatory Visit: Payer: Medicare HMO | Admitting: Nurse Practitioner

## 2022-12-30 VITALS — BP 105/71 | HR 85 | Temp 97.6°F | Ht 61.0 in | Wt 139.2 lb

## 2022-12-30 DIAGNOSIS — R051 Acute cough: Secondary | ICD-10-CM | POA: Diagnosis not present

## 2022-12-30 DIAGNOSIS — H524 Presbyopia: Secondary | ICD-10-CM | POA: Diagnosis not present

## 2022-12-30 DIAGNOSIS — E119 Type 2 diabetes mellitus without complications: Secondary | ICD-10-CM | POA: Diagnosis not present

## 2022-12-30 DIAGNOSIS — Q141 Congenital malformation of retina: Secondary | ICD-10-CM | POA: Diagnosis not present

## 2022-12-30 DIAGNOSIS — R0981 Nasal congestion: Secondary | ICD-10-CM | POA: Diagnosis not present

## 2022-12-30 DIAGNOSIS — H52223 Regular astigmatism, bilateral: Secondary | ICD-10-CM | POA: Diagnosis not present

## 2022-12-30 DIAGNOSIS — H2513 Age-related nuclear cataract, bilateral: Secondary | ICD-10-CM | POA: Diagnosis not present

## 2022-12-30 LAB — VERITOR FLU A/B WAIVED
Influenza A: NEGATIVE
Influenza B: NEGATIVE

## 2022-12-30 LAB — HM DIABETES EYE EXAM

## 2022-12-30 MED ORDER — FLUTICASONE PROPIONATE 50 MCG/ACT NA SUSP
2.0000 | Freq: Every day | NASAL | 0 refills | Status: DC
Start: 2022-12-30 — End: 2023-08-14

## 2022-12-30 NOTE — Progress Notes (Signed)
Established Patient Office Visit  Subjective   Patient ID: Chase [name], male    DOB: Jun 10, 1950  Age: 72 y.o. MRN: [id number]  Chief Complaint  Patient presents with   Headache    Has been going on for 4 days. Has taken tylenol and nyquil helped some   Cough    Has been going on for 4 days   Dizziness    Has been going on for 4 days   Nasal Congestion    Has been going on for 4 days    HPI SUBJECTIVE:  Chase [name] is a 72 y.o. male who complains of congestion, post nasal drip, dry cough, myalgias, and headache for 4 days. He denies a history of anorexia, chest pain, nausea, vomiting, and wheezing and denies a history of asthma. Patient denies smoke cigarettes. 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) 10/25/2019   Type 2 diabetes mellitus with other specified complication (HCC) 04/25/2019   Hyperlipidemia associated with type 2 diabetes mellitus (HCC) 04/25/2019   Hypertension associated with diabetes (HCC) 04/25/2019   Inguinal hernia 12/08/2018   Spondylolisthesis of lumbar region 02/17/2018   Osteoarthritis of spine with radiculopathy, lumbar region 08/01/2015   HNP (herniated nucleus pulposus) with myelopathy, cervical 04/04/2015   Past Medical History:  Diagnosis Date   Arthritis    Cancer (HCC)    prostate 2017   Depression    Diabetes mellitus without complication (HCC)    Headache    High cholesterol    Hypertension    Inguinal hernia    rt   Neck pain    Numbness and tingling in hands    Urinary frequency    Past Surgical History:  Procedure Laterality Date   ANTERIOR CERVICAL DECOMP/DISCECTOMY FUSION N/A 04/04/2015   Procedure: ANTERIOR CERVICAL DECOMPRESSION/DISCECTOMY FUSION PLATING BONEGRAFT  CERVICAL FOUR-FIVE CERVICAL FIVE-SIX;  Surgeon: Coletta Memos, MD;  Location: [facility];  Service: Neurosurgery;  Laterality: N/A;   BACK SURGERY     lower back with Dr. Franky Macho   COLONOSCOPY WITH PROPOFOL N/A 03/22/2019   Procedure: COLONOSCOPY WITH PROPOFOL;  Surgeon: West Bali, MD;  Location: [facility];  Service: Endoscopy;  Laterality: N/A;  8:30am   INGUINAL HERNIA REPAIR Right 12/22/2018   Procedure: HERNIA REPAIR INGUINAL ADULT WITH MESH;  Surgeon: Franky Macho, MD;  Location: [facility];  Service: General;  Laterality: Right;   POLYPECTOMY  03/22/2019   Procedure: POLYPECTOMY;  Surgeon: West Bali, MD;  Location: [facility];  Service: Endoscopy;;  Cold snare polypectomy cecal, hepatic flexure polyps x 2,descending colon  polyps x 3,  and rectal polyps x 3    SHOULDER ARTHROSCOPY Right    UPPER EXTREMITY VENOGRAPHY N/A 07/22/2016   Procedure: Upper Extremity Venography - bilateral arm;  Surgeon: Nada Libman, MD;  Location: [facility];  Service: Cardiovascular;  Laterality: N/A;   Social History   Tobacco Use   Smoking status: Former    Current packs/day: 0.00    Average packs/day: 0.3 packs/day for 40.0 years (10.0 ttl pk-yrs)    Types: Cigarettes    Start date: 12/19/1977    Quit date: 12/19/2017    Years since quitting: 5.0   Smokeless tobacco: Never  Tobacco comments:    quit 11/2017 (as of 12/08/2018)  Vaping Use   Vaping status: Never Used  Substance Use Topics   Alcohol use: Not Currently    Comment: no ETOH since since 2016 (as of 12/08/2018)   Drug use: No   Social History   Socioeconomic History   Marital status: Married    Spouse name: Not on file   Number of children: 11   Years of education: Not on file   Highest education level: Not on file  Occupational History   Not on file  Tobacco Use   Smoking status: Former    Current packs/day: 0.00    Average packs/day: 0.3 packs/day for 40.0 years (10.0 ttl pk-yrs)    Types: Cigarettes     Start date: 12/19/1977    Quit date: 12/19/2017    Years since quitting: 5.0   Smokeless tobacco: Never   Tobacco comments:    quit 11/2017 (as of 12/08/2018)  Vaping Use   Vaping status: Never Used  Substance and Sexual Activity   Alcohol use: Not Currently    Comment: no ETOH since since 2016 (as of 12/08/2018)   Drug use: No   Sexual activity: Not Currently  Other Topics Concern   Not on file  Social History Narrative   Right handed   livs with wife in a one story   Social Determinants of Health   Financial Resource Strain: Low Risk  (12/09/2022)   Overall Financial Resource Strain (CARDIA)    Difficulty of Paying Living Expenses: Not hard at all  Food Insecurity: No Food Insecurity (12/09/2022)   Hunger Vital Sign    Worried About Running Out of Food in the Last Year: Never true    Ran Out of Food in the Last Year: Never true  Transportation Needs: No Transportation Needs (12/09/2022)   PRAPARE - Administrator, Civil Service (Medical): No    Lack of Transportation (Non-Medical): No  Physical Activity: Inactive (12/09/2022)   Exercise Vital Sign    Days of Exercise per Week: 0 days    Minutes of Exercise per Session: 0 min  Stress: No Stress Concern Present (12/09/2022)   [employer] of Occupational Health - Occupational Stress Questionnaire    Feeling of Stress : Not at all  Social Connections: Moderately Isolated (12/09/2022)   Social Connection and Isolation Panel [NHANES]    Frequency of Communication with Friends and Family: More than three times a week    Frequency of Social Gatherings with Friends and Family: More than three times a week    Attends Religious Services: Never    Database administrator or Organizations: No    Attends Banker Meetings: Never    Marital Status: Married  Catering manager Violence: Not At Risk (12/09/2022)   Humiliation, Afraid, Rape, and Kick questionnaire    Fear of Current or Ex-Partner: No    Emotionally Abused:  No    Physically Abused: No    Sexually Abused: No   Family Status  Relation Name Status   Neg Hx  (Not Specified)  No partnership data on file   Family History  Problem Relation Age of Onset   Colon cancer Neg Hx    Colon polyps Neg Hx    No Known Allergies    Review of Systems  Constitutional:  Positive for malaise/fatigue. Negative for chills and fever.  HENT:  Positive for congestion.   Respiratory:  Positive for cough. Negative for  shortness of breath and wheezing.   Cardiovascular:  Negative for chest pain and leg swelling.  Gastrointestinal:  Negative for constipation, diarrhea, nausea and vomiting.  Musculoskeletal:  Positive for myalgias. Negative for falls.  Neurological:  Positive for headaches. Negative for dizziness.   Negative unless indicated in HPI   Objective:     BP 105/71   Pulse 85   Temp 97.6 F (36.4 C) (Temporal)   Ht 5\' 1"  (1.549 m)   Wt 139 lb 3.2 oz (63.1 kg)   SpO2 96%   BMI 26.30 kg/m  BP Readings from Last 3 Encounters:  12/30/22 105/71  12/02/22 111/72  11/19/22 106/74   Wt Readings from Last 3 Encounters:  12/30/22 139 lb 3.2 oz (63.1 kg)  12/09/22 138 lb (62.6 kg)  12/02/22 138 lb (62.6 kg)      Physical Exam Vitals and nursing note reviewed.  Constitutional:      Appearance: He is well-developed.  HENT:     Head: Normocephalic and atraumatic.     Nose:     Right Sinus: Frontal sinus tenderness present.     Left Sinus: Frontal sinus tenderness present.  Eyes:     General: No scleral icterus.    Extraocular Movements: Extraocular movements intact.     Conjunctiva/sclera: Conjunctivae normal.     Pupils: Pupils are equal, round, and reactive to light.  Cardiovascular:     Rate and Rhythm: Normal rate and regular rhythm.  Pulmonary:     Effort: Pulmonary effort is normal.     Breath sounds: Normal breath sounds.  Skin:    General: Skin is warm and dry.     Coloration: Skin is not jaundiced.     Findings: No rash.   Neurological:     Mental Status: He is alert and oriented to person, place, and time. Mental status is at baseline.     Gait: Gait normal.     Comments: Used a can for ambulation    No results found for any visits on 12/30/22.  Last CBC Lab Results  Component Value Date   WBC 13.2 (H) 11/18/2022   HGB 15.9 11/18/2022   HCT 48.1 11/18/2022   MCV 91.6 11/18/2022   MCH 30.3 11/18/2022   RDW 14.4 11/18/2022   PLT 221 11/18/2022   Last metabolic panel Lab Results  Component Value Date   GLUCOSE 177 (H) 11/18/2022   NA 132 (L) 11/18/2022   K 3.7 11/18/2022   CL 97 (L) 11/18/2022   CO2 25 11/18/2022   BUN 20 11/18/2022   CREATININE 0.72 11/18/2022   GFRNONAA >60 11/18/2022   CALCIUM 9.3 11/18/2022   PROT 7.2 11/18/2022   ALBUMIN 3.8 11/18/2022   LABGLOB 1.7 11/13/2022   AGRATIO 2.6 11/13/2022   BILITOT 0.9 11/18/2022   ALKPHOS 59 11/18/2022   AST 15 11/18/2022   ALT 18 11/18/2022   ANIONGAP 10 11/18/2022   Last lipids Lab Results  Component Value Date   CHOL 162 11/13/2022   HDL 40 11/13/2022   LDLCALC 85 11/13/2022   TRIG 217 (H) 11/13/2022   CHOLHDL 4.1 11/13/2022        Assessment & Plan:  Acute cough -     Novel Coronavirus, NAA ([facility]) -     Veritor Flu A/B Waived  Nasal congestion -     Novel Coronavirus, NAA ([facility]) -     Veritor Flu A/B Waived -     Fluticasone Propionate; Place 2 sprays into both nostrils  daily.  Dispense: 16 g; Refill: 0   ASSESSMENT:  Minnie Crisanto seen today for Viral URI, no acute distress viral upper respiratory illness and sinusitis  PLAN: Flonase for nasal congestin  POC Flu negative Awaiting COVID results Continue OTC Tylenol and Nyquil Increase hydration Symptomatic therapy suggested: push fluids, rest, and return office visit prn if symptoms persist or worsen. Lack of antibiotic effectiveness discussed with him. Call or return to clinic prn if these symptoms worsen or fail to improve as anticipated.  Return if  symptoms worsen or fail to improve.    Arrie Aran Santa Lighter, DNP [facility] [address] [place], [place] [address] [phone]

## 2023-01-01 DIAGNOSIS — H52223 Regular astigmatism, bilateral: Secondary | ICD-10-CM | POA: Diagnosis not present

## 2023-01-01 DIAGNOSIS — H5213 Myopia, bilateral: Secondary | ICD-10-CM | POA: Diagnosis not present

## 2023-02-06 ENCOUNTER — Other Ambulatory Visit: Payer: Self-pay | Admitting: Family Medicine

## 2023-02-06 DIAGNOSIS — E1169 Type 2 diabetes mellitus with other specified complication: Secondary | ICD-10-CM

## 2023-02-12 ENCOUNTER — Other Ambulatory Visit: Payer: Self-pay | Admitting: Family Medicine

## 2023-02-12 DIAGNOSIS — E1159 Type 2 diabetes mellitus with other circulatory complications: Secondary | ICD-10-CM

## 2023-02-12 DIAGNOSIS — E1169 Type 2 diabetes mellitus with other specified complication: Secondary | ICD-10-CM

## 2023-02-13 ENCOUNTER — Ambulatory Visit (INDEPENDENT_AMBULATORY_CARE_PROVIDER_SITE_OTHER): Payer: Medicare HMO | Admitting: Family Medicine

## 2023-02-13 ENCOUNTER — Encounter: Payer: Self-pay | Admitting: Family Medicine

## 2023-02-13 VITALS — BP 130/82 | HR 78 | Ht 61.0 in | Wt 142.0 lb

## 2023-02-13 DIAGNOSIS — I152 Hypertension secondary to endocrine disorders: Secondary | ICD-10-CM

## 2023-02-13 DIAGNOSIS — G4700 Insomnia, unspecified: Secondary | ICD-10-CM

## 2023-02-13 DIAGNOSIS — E1169 Type 2 diabetes mellitus with other specified complication: Secondary | ICD-10-CM | POA: Diagnosis not present

## 2023-02-13 DIAGNOSIS — E1159 Type 2 diabetes mellitus with other circulatory complications: Secondary | ICD-10-CM | POA: Diagnosis not present

## 2023-02-13 DIAGNOSIS — Z23 Encounter for immunization: Secondary | ICD-10-CM

## 2023-02-13 DIAGNOSIS — E785 Hyperlipidemia, unspecified: Secondary | ICD-10-CM | POA: Diagnosis not present

## 2023-02-13 LAB — CBC WITH DIFFERENTIAL/PLATELET
Basophils Absolute: 0.1 10*3/uL (ref 0.0–0.2)
Basos: 1 %
EOS (ABSOLUTE): 0.3 10*3/uL (ref 0.0–0.4)
Eos: 7 %
Hematocrit: 45.6 % (ref 37.5–51.0)
Hemoglobin: 15 g/dL (ref 13.0–17.7)
Immature Grans (Abs): 0 10*3/uL (ref 0.0–0.1)
Immature Granulocytes: 1 %
Lymphocytes Absolute: 0.9 10*3/uL (ref 0.7–3.1)
Lymphs: 21 %
MCH: 29.1 pg (ref 26.6–33.0)
MCHC: 32.9 g/dL (ref 31.5–35.7)
MCV: 89 fL (ref 79–97)
Monocytes Absolute: 0.4 10*3/uL (ref 0.1–0.9)
Monocytes: 8 %
Neutrophils Absolute: 2.7 10*3/uL (ref 1.4–7.0)
Neutrophils: 62 %
Platelets: 222 10*3/uL (ref 150–450)
RBC: 5.15 x10E6/uL (ref 4.14–5.80)
RDW: 14.5 % (ref 11.6–15.4)
WBC: 4.4 10*3/uL (ref 3.4–10.8)

## 2023-02-13 LAB — BAYER DCA HB A1C WAIVED: HB A1C (BAYER DCA - WAIVED): 6.7 % — ABNORMAL HIGH (ref 4.8–5.6)

## 2023-02-13 MED ORDER — TRAZODONE HCL 100 MG PO TABS
100.0000 mg | ORAL_TABLET | Freq: Every day | ORAL | 3 refills | Status: DC
Start: 2023-02-13 — End: 2023-08-14

## 2023-02-13 MED ORDER — ATORVASTATIN CALCIUM 40 MG PO TABS
40.0000 mg | ORAL_TABLET | Freq: Every evening | ORAL | 3 refills | Status: DC
Start: 2023-02-13 — End: 2023-03-10

## 2023-02-13 MED ORDER — EMPAGLIFLOZIN 25 MG PO TABS: 25.0000 mg | ORAL_TABLET | Freq: Every day | ORAL | 3 refills | Status: DC

## 2023-02-13 MED ORDER — LISINOPRIL 5 MG PO TABS: 5.0000 mg | ORAL_TABLET | Freq: Every day | ORAL | 3 refills | Status: DC

## 2023-02-13 MED ORDER — METFORMIN HCL 500 MG PO TABS: 1000.0000 mg | ORAL_TABLET | Freq: Two times a day (BID) | ORAL | 3 refills | Status: DC

## 2023-02-13 NOTE — Progress Notes (Signed)
BP 130/82   Pulse 78   Ht 5\' 1"  (1.549 m)   Wt 142 lb (64.4 kg)   SpO2 97%   BMI 26.83 kg/m    Subjective:   Patient ID: Chase [name], male    DOB: 1950-08-15, 72 y.o.   MRN: [id number]  HPI: Chase [name] is a 72 y.o. male presenting on 02/13/2023 for Medical Management of Chronic Issues and Diabetes   HPI Type 2 diabetes mellitus Patient comes in today for recheck of his diabetes. Patient has been currently taking Jardiance and metformin. Patient is currently on an ACE inhibitor/ARB. Patient has not seen an ophthalmologist this year. Patient denies any new issues with their feet. The symptom started onset as an adult hypertension and hyperlipidemia ARE RELATED TO DM   Hypertension Patient is currently on Jardiance and lisinopril, and their blood pressure today is 130/82. Patient denies any lightheadedness or dizziness. Patient denies headaches, blurred vision, chest pains, shortness of breath, or weakness. Denies any side effects from medication and is content with current medication.   Hyperlipidemia Patient is coming in for recheck of his hyperlipidemia. The patient is currently taking atorvastatin. They deny any issues with myalgias or history of liver damage from it. They deny any focal numbness or weakness or chest pain.   Relevant past medical, surgical, family and social history reviewed and updated as indicated. Interim medical history since our last visit reviewed. Allergies and medications reviewed and updated.  Review of Systems  Constitutional:  Negative for chills and fever.  Eyes:  Negative for visual disturbance.  Respiratory:  Negative for shortness of breath and wheezing.   Cardiovascular:  Negative for chest pain and leg swelling.  Musculoskeletal:  Negative for back pain and gait problem.  Skin:  Negative for rash.  Neurological:  Positive for headaches (He says he gets headaches sometimes when he does not sleep as well and he has been having  trouble sleeping more often.  He says when he sleeps well he does not have headaches). Negative for dizziness, weakness and light-headedness.  All other systems reviewed and are negative.   Per HPI unless specifically indicated above   Allergies as of 02/13/2023   No Known Allergies      Medication List        Accurate as of February 13, 2023  1:50 PM. If you have any questions, ask your nurse or doctor.          atorvastatin 40 MG tablet Commonly known as: LIPITOR Take 1 tablet (40 mg total) by mouth every evening.   blood glucose meter kit and supplies Kit Dispense based on patient and insurance preference. Use up to four times daily as directed.   empagliflozin 25 MG Tabs tablet Commonly known as: Jardiance Take 1 tablet (25 mg total) by mouth daily before breakfast.   fluticasone 50 MCG/ACT nasal spray Commonly known as: FLONASE Place 2 sprays into both nostrils daily.   glucose blood test strip Use as instructed to test blood sugar once daily.  Please fill One touch verio test strips. DX:E11.69   lisinopril 5 MG tablet Commonly known as: ZESTRIL Take 1 tablet (5 mg total) by mouth daily.   meclizine 25 MG tablet Commonly known as: ANTIVERT Take 1 tablet (25 mg total) by mouth 3 (three) times daily as needed for dizziness.   metFORMIN 500 MG tablet Commonly known as: GLUCOPHAGE Take 2 tablets (1,000 mg total) by mouth 2 (two) times daily with a meal.  What changed: See the new instructions. Changed by: Elige Radon Brance Dartt   OneTouch Delica Lancets 33G Misc Use to check blood sugar 2 times daily. E11.69   OneTouch Verio w/Device Kit Take 1 each by mouth in the morning and at bedtime. Use to test blood sugar daily   traZODone 100 MG tablet Commonly known as: DESYREL Take 1 tablet (100 mg total) by mouth at bedtime. Started by: Elige Radon Lura Falor         Objective:   BP 130/82   Pulse 78   Ht 5\' 1"  (1.549 m)   Wt 142 lb (64.4 kg)   SpO2 97%    BMI 26.83 kg/m   Wt Readings from Last 3 Encounters:  02/13/23 142 lb (64.4 kg)  12/30/22 139 lb 3.2 oz (63.1 kg)  12/09/22 138 lb (62.6 kg)    Physical Exam Vitals and nursing note reviewed.  Constitutional:      General: He is not in acute distress.    Appearance: He is well-developed. He is not diaphoretic.  Eyes:     General: No scleral icterus.    Conjunctiva/sclera: Conjunctivae normal.  Neck:     Thyroid: No thyromegaly.  Cardiovascular:     Rate and Rhythm: Normal rate and regular rhythm.     Heart sounds: Normal heart sounds. No murmur heard. Pulmonary:     Effort: Pulmonary effort is normal. No respiratory distress.     Breath sounds: Normal breath sounds. No wheezing.  Musculoskeletal:        General: Normal range of motion.     Cervical back: Neck supple.  Lymphadenopathy:     Cervical: No cervical adenopathy.  Skin:    General: Skin is warm and dry.     Findings: No rash.  Neurological:     Mental Status: He is alert and oriented to person, place, and time.     Coordination: Coordination normal.  Psychiatric:        Behavior: Behavior normal.       Assessment & Plan:   Problem List Items Addressed This Visit       Cardiovascular and Mediastinum   Hypertension associated with diabetes (HCC)   Relevant Medications   atorvastatin (LIPITOR) 40 MG tablet   empagliflozin (JARDIANCE) 25 MG TABS tablet   lisinopril (ZESTRIL) 5 MG tablet   metFORMIN (GLUCOPHAGE) 500 MG tablet     Endocrine   Type 2 diabetes mellitus with other specified complication (HCC) - Primary   Relevant Medications   atorvastatin (LIPITOR) 40 MG tablet   empagliflozin (JARDIANCE) 25 MG TABS tablet   lisinopril (ZESTRIL) 5 MG tablet   metFORMIN (GLUCOPHAGE) 500 MG tablet   Other Relevant Orders   Bayer DCA Hb A1c Waived   CBC with Differential/Platelet   CMP14+EGFR   Hyperlipidemia associated with type 2 diabetes mellitus (HCC)   Relevant Medications   atorvastatin  (LIPITOR) 40 MG tablet   empagliflozin (JARDIANCE) 25 MG TABS tablet   lisinopril (ZESTRIL) 5 MG tablet   metFORMIN (GLUCOPHAGE) 500 MG tablet   Other Visit Diagnoses     Insomnia, unspecified type       Relevant Medications   traZODone (DESYREL) 100 MG tablet       Will try trazodone at a higher dose at 100 mg for insomnia and see if that helps with his sleep and headaches.  He has tried melatonin recently. Follow up plan: Return in about 3 months (around 05/15/2023), or if symptoms worsen or fail to  improve, for Diabetes.  Counseling provided for all of the vaccine components Orders Placed This Encounter  Procedures   Bayer DCA Hb A1c Waived   CBC with Differential/Platelet   CMP14+EGFR    Arville Care, MD [facility] 02/13/2023, 1:50 PM

## 2023-02-16 DIAGNOSIS — R9721 Rising PSA following treatment for malignant neoplasm of prostate: Secondary | ICD-10-CM | POA: Diagnosis not present

## 2023-03-10 ENCOUNTER — Other Ambulatory Visit: Payer: Self-pay | Admitting: *Deleted

## 2023-03-10 DIAGNOSIS — E1169 Type 2 diabetes mellitus with other specified complication: Secondary | ICD-10-CM

## 2023-03-10 MED ORDER — ATORVASTATIN CALCIUM 40 MG PO TABS
40.0000 mg | ORAL_TABLET | Freq: Every evening | ORAL | 2 refills | Status: DC
Start: 2023-03-10 — End: 2024-02-18

## 2023-04-08 ENCOUNTER — Telehealth: Payer: Self-pay | Admitting: Family Medicine

## 2023-05-07 LAB — LAB REPORT - SCANNED: Albumin, Urine POC: 30

## 2023-05-15 ENCOUNTER — Ambulatory Visit (INDEPENDENT_AMBULATORY_CARE_PROVIDER_SITE_OTHER): Payer: Medicare HMO | Admitting: Family Medicine

## 2023-05-15 ENCOUNTER — Encounter: Payer: Self-pay | Admitting: Family Medicine

## 2023-05-15 VITALS — BP 121/81 | HR 57 | Temp 97.6°F | Ht 61.0 in | Wt 138.0 lb

## 2023-05-15 DIAGNOSIS — E1169 Type 2 diabetes mellitus with other specified complication: Secondary | ICD-10-CM

## 2023-05-15 DIAGNOSIS — E1159 Type 2 diabetes mellitus with other circulatory complications: Secondary | ICD-10-CM

## 2023-05-15 DIAGNOSIS — I152 Hypertension secondary to endocrine disorders: Secondary | ICD-10-CM | POA: Diagnosis not present

## 2023-05-15 DIAGNOSIS — Z1211 Encounter for screening for malignant neoplasm of colon: Secondary | ICD-10-CM | POA: Diagnosis not present

## 2023-05-15 DIAGNOSIS — K30 Functional dyspepsia: Secondary | ICD-10-CM

## 2023-05-15 DIAGNOSIS — Z7984 Long term (current) use of oral hypoglycemic drugs: Secondary | ICD-10-CM | POA: Diagnosis not present

## 2023-05-15 DIAGNOSIS — E785 Hyperlipidemia, unspecified: Secondary | ICD-10-CM | POA: Diagnosis not present

## 2023-05-15 LAB — LIPID PANEL

## 2023-05-15 LAB — BAYER DCA HB A1C WAIVED: HB A1C (BAYER DCA - WAIVED): 7.4 % — ABNORMAL HIGH (ref 4.8–5.6)

## 2023-05-15 MED ORDER — FAMOTIDINE 20 MG PO TABS: 20.0000 mg | ORAL_TABLET | Freq: Two times a day (BID) | ORAL | 1 refills | Status: DC

## 2023-05-15 MED ORDER — ONDANSETRON 4 MG PO TBDP: 4.0000 mg | ORAL_TABLET | Freq: Three times a day (TID) | ORAL | 1 refills | Status: DC | PRN

## 2023-05-15 NOTE — Progress Notes (Signed)
BP 121/81   Pulse (!) 57   Temp 97.6 F (36.4 C)   Ht 5\' 1"  (1.549 m)   Wt 138 lb (62.6 kg)   SpO2 97%   BMI 26.07 kg/m    Subjective:   Patient ID: Chase [name], male    DOB: Nov 12, 1950, 72 y.o.   MRN: [id number]  HPI: Chase [name] is a 72 y.o. male presenting on 05/15/2023 for Medical Management of Chronic Issues and Diabetes   HPI Type 2 diabetes mellitus Patient comes in today for recheck of his diabetes. Patient has been currently taking Jardiance and metformin. Patient is currently on an ACE inhibitor/ARB. Patient has seen an ophthalmologist this year. Patient denies any new issues with their feet. The symptom started onset as an adult hypertension and hyperlipidemia ARE RELATED TO DM   Hypertension Patient is currently on lisinopril, and their blood pressure today is 121/81. Patient denies any lightheadedness or dizziness. Patient denies headaches, blurred vision, chest pains, shortness of breath, or weakness. Denies any side effects from medication and is content with current medication.   Hyperlipidemia Patient is coming in for recheck of his hyperlipidemia. The patient is currently taking atorvastatin. They deny any issues with myalgias or history of liver damage from it. They deny any focal numbness or weakness or chest pain.   Patient still fights some constipation and is due for colonoscopy so we will refer to gastroenterology.  He is using MiraLAX and does help some, recommended to double up on the days that he is doing.  Relevant past medical, surgical, family and social history reviewed and updated as indicated. Interim medical history since our last visit reviewed. Allergies and medications reviewed and updated.  Review of Systems  Constitutional:  Negative for chills and fever.  Eyes:  Negative for visual disturbance.  Respiratory:  Negative for shortness of breath and wheezing.   Cardiovascular:  Negative for chest pain and leg swelling.   Gastrointestinal:  Positive for abdominal distention and constipation. Negative for abdominal pain, diarrhea, nausea and vomiting.  Musculoskeletal:  Negative for back pain and gait problem.  Skin:  Negative for rash.  All other systems reviewed and are negative.   Per HPI unless specifically indicated above   Allergies as of 05/15/2023   No Known Allergies      Medication List        Accurate as of May 15, 2023 10:44 AM. If you have any questions, ask your nurse or doctor.          atorvastatin 40 MG tablet Commonly known as: LIPITOR Take 1 tablet (40 mg total) by mouth every evening.   blood glucose meter kit and supplies Kit Dispense based on patient and insurance preference. Use up to four times daily as directed.   empagliflozin 25 MG Tabs tablet Commonly known as: Jardiance Take 1 tablet (25 mg total) by mouth daily before breakfast.   famotidine 20 MG tablet Commonly known as: PEPCID Take 1 tablet (20 mg total) by mouth 2 (two) times daily. Started by: Elige Radon Allean Montfort   fluticasone 50 MCG/ACT nasal spray Commonly known as: FLONASE Place 2 sprays into both nostrils daily.   glucose blood test strip Use as instructed to test blood sugar once daily.  Please fill One touch verio test strips. DX:E11.69   lisinopril 5 MG tablet Commonly known as: ZESTRIL Take 1 tablet (5 mg total) by mouth daily.   meclizine 25 MG tablet Commonly known as: ANTIVERT Take 1  tablet (25 mg total) by mouth 3 (three) times daily as needed for dizziness.   metFORMIN 500 MG tablet Commonly known as: GLUCOPHAGE Take 2 tablets (1,000 mg total) by mouth 2 (two) times daily with a meal.   ondansetron 4 MG disintegrating tablet Commonly known as: ZOFRAN-ODT Take 1 tablet (4 mg total) by mouth every 8 (eight) hours as needed for nausea or vomiting. Started by: Elige Radon Westlynn Fifer   OneTouch Delica Lancets 33G Misc Use to check blood sugar 2 times daily. E11.69   OneTouch  Verio w/Device Kit Take 1 each by mouth in the morning and at bedtime. Use to test blood sugar daily   traZODone 100 MG tablet Commonly known as: DESYREL Take 1 tablet (100 mg total) by mouth at bedtime.         Objective:   BP 121/81   Pulse (!) 57   Temp 97.6 F (36.4 C)   Ht 5\' 1"  (1.549 m)   Wt 138 lb (62.6 kg)   SpO2 97%   BMI 26.07 kg/m   Wt Readings from Last 3 Encounters:  05/15/23 138 lb (62.6 kg)  02/13/23 142 lb (64.4 kg)  12/30/22 139 lb 3.2 oz (63.1 kg)    Physical Exam Vitals and nursing note reviewed.  Constitutional:      General: He is not in acute distress.    Appearance: He is well-developed. He is not diaphoretic.  Eyes:     General: No scleral icterus.    Conjunctiva/sclera: Conjunctivae normal.  Neck:     Thyroid: No thyromegaly.  Cardiovascular:     Rate and Rhythm: Normal rate and regular rhythm.     Heart sounds: Normal heart sounds. No murmur heard. Pulmonary:     Effort: Pulmonary effort is normal. No respiratory distress.     Breath sounds: Normal breath sounds. No wheezing.  Abdominal:     General: Abdomen is flat. Bowel sounds are normal. There is no distension.     Tenderness: There is abdominal tenderness (Mild epigastric tenderness). There is no guarding or rebound.     Hernia: No hernia is present.  Musculoskeletal:        General: Normal range of motion.     Cervical back: Neck supple.  Lymphadenopathy:     Cervical: No cervical adenopathy.  Skin:    General: Skin is warm and dry.     Findings: No rash.  Neurological:     Mental Status: He is alert and oriented to person, place, and time.     Coordination: Coordination normal.  Psychiatric:        Behavior: Behavior normal.       Assessment & Plan:   Problem List Items Addressed This Visit       Cardiovascular and Mediastinum   Hypertension associated with diabetes (HCC)   Relevant Orders   CBC with Differential/Platelet   CMP14+EGFR   Lipid panel   Bayer  DCA Hb A1c Waived     Endocrine   Type 2 diabetes mellitus with other specified complication (HCC) - Primary   Relevant Orders   CBC with Differential/Platelet   CMP14+EGFR   Lipid panel   Bayer DCA Hb A1c Waived   Microalbumin / creatinine urine ratio   Hyperlipidemia associated with type 2 diabetes mellitus (HCC)   Relevant Orders   CBC with Differential/Platelet   CMP14+EGFR   Lipid panel   Bayer DCA Hb A1c Waived   Other Visit Diagnoses  Colon cancer screening       Relevant Orders   Ambulatory referral to Gastroenterology     Indigestion       Relevant Medications   famotidine (PEPCID) 20 MG tablet   ondansetron (ZOFRAN-ODT) 4 MG disintegrating tablet       A1c is up some at 7.4 but he has been traveling and holidays and eating differently so just focus on diet.  No change in medication today. Follow up plan: Return in about 3 months (around 08/13/2023), or if symptoms worsen or fail to improve, for dm and htn.  Counseling provided for all of the vaccine components Orders Placed This Encounter  Procedures   CBC with Differential/Platelet   CMP14+EGFR   Lipid panel   Bayer DCA Hb A1c Waived   Microalbumin / creatinine urine ratio   Ambulatory referral to Gastroenterology    Arville Care, MD [facility] 05/15/2023, 10:44 AM

## 2023-05-16 LAB — CBC WITH DIFFERENTIAL/PLATELET
Basophils Absolute: 0.1 10*3/uL (ref 0.0–0.2)
Basos: 1 %
EOS (ABSOLUTE): 0.3 10*3/uL (ref 0.0–0.4)
Eos: 5 %
Hematocrit: 48.8 % (ref 37.5–51.0)
Hemoglobin: 15.7 g/dL (ref 13.0–17.7)
Immature Grans (Abs): 0.1 10*3/uL (ref 0.0–0.1)
Immature Granulocytes: 1 %
Lymphocytes Absolute: 1 10*3/uL (ref 0.7–3.1)
Lymphs: 14 %
MCH: 28.3 pg (ref 26.6–33.0)
MCHC: 32.2 g/dL (ref 31.5–35.7)
MCV: 88 fL (ref 79–97)
Monocytes Absolute: 0.5 10*3/uL (ref 0.1–0.9)
Monocytes: 8 %
Neutrophils Absolute: 4.9 10*3/uL (ref 1.4–7.0)
Neutrophils: 71 %
Platelets: 263 10*3/uL (ref 150–450)
RBC: 5.55 x10E6/uL (ref 4.14–5.80)
RDW: 13.7 % (ref 11.6–15.4)
WBC: 6.9 10*3/uL (ref 3.4–10.8)

## 2023-05-16 LAB — CMP14+EGFR
ALT: 14 IU/L (ref 0–44)
AST: 14 IU/L (ref 0–40)
Albumin: 4.4 g/dL (ref 3.9–4.9)
Alkaline Phosphatase: 73 IU/L (ref 44–121)
BUN/Creatinine Ratio: 16 (ref 10–24)
BUN: 9 mg/dL (ref 8–27)
Bilirubin Total: 0.3 mg/dL (ref 0.0–1.2)
CO2: 24 mmol/L (ref 20–29)
Calcium: 9.7 mg/dL (ref 8.6–10.2)
Chloride: 100 mmol/L (ref 96–106)
Creatinine, Ser: 0.58 mg/dL — ABNORMAL LOW (ref 0.76–1.27)
Globulin, Total: 2.2 g/dL (ref 1.5–4.5)
Glucose: 127 mg/dL — ABNORMAL HIGH (ref 70–99)
Potassium: 4.4 mmol/L (ref 3.5–5.2)
Sodium: 139 mmol/L (ref 134–144)
Total Protein: 6.6 g/dL (ref 6.0–8.5)
eGFR: 105 mL/min/{1.73_m2} (ref >59)

## 2023-05-16 LAB — LIPID PANEL
Cholesterol, Total: 165 mg/dL (ref 100–199)
HDL: 31 mg/dL — ABNORMAL LOW (ref >39)
LDL CALC COMMENT:: 5.3 ratio — ABNORMAL HIGH (ref 0.0–5.0)
LDL Chol Calc (NIH): 98 mg/dL (ref 0–99)
Triglycerides: 211 mg/dL — ABNORMAL HIGH (ref 0–149)
VLDL Cholesterol Cal: 36 mg/dL (ref 5–40)

## 2023-05-17 LAB — MICROALBUMIN / CREATININE URINE RATIO
Creatinine, Urine: 39.4 mg/dL
Microalb/Creat Ratio: 30 mg/g{creat} — ABNORMAL HIGH (ref 0–29)
Microalbumin, Urine: 11.9 ug/mL

## 2023-06-08 ENCOUNTER — Ambulatory Visit (INDEPENDENT_AMBULATORY_CARE_PROVIDER_SITE_OTHER): Payer: Medicare HMO | Admitting: Nurse Practitioner

## 2023-06-08 ENCOUNTER — Encounter: Payer: Self-pay | Admitting: Nurse Practitioner

## 2023-06-08 VITALS — BP 128/78 | HR 73 | Temp 97.3°F | Ht 61.0 in | Wt 138.0 lb

## 2023-06-08 DIAGNOSIS — J069 Acute upper respiratory infection, unspecified: Secondary | ICD-10-CM

## 2023-06-08 DIAGNOSIS — K30 Functional dyspepsia: Secondary | ICD-10-CM | POA: Diagnosis not present

## 2023-06-08 MED ORDER — BENZONATATE 100 MG PO CAPS: 100.0000 mg | ORAL_CAPSULE | Freq: Two times a day (BID) | ORAL | 0 refills | Status: DC | PRN

## 2023-06-08 MED ORDER — PREDNISONE 20 MG PO TABS: 40.0000 mg | ORAL_TABLET | Freq: Every day | ORAL | 0 refills | Status: AC

## 2023-06-08 MED ORDER — OMEPRAZOLE 20 MG PO CPDR: 20.0000 mg | DELAYED_RELEASE_CAPSULE | Freq: Every day | ORAL | 3 refills | Status: DC

## 2023-06-08 MED ORDER — AZITHROMYCIN 250 MG PO TABS: ORAL_TABLET | ORAL | 0 refills | Status: DC

## 2023-06-08 NOTE — Progress Notes (Signed)
 Subjective:    Patient ID: Chase [name], male    DOB: [birth year], 73 y.o.   MRN: [id number]   Chief Complaint: uri # [id number]- interperter  URI  This is a new problem. The current episode started 1 to 4 weeks ago. The problem has been waxing and waning. There has been no fever. Associated symptoms include congestion, coughing, headaches and rhinorrhea. He has tried nothing for the symptoms. The treatment provided mild relief.   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  HENT:  Positive for congestion and rhinorrhea.   Respiratory:  Positive for cough and shortness of breath.   Neurological:  Positive for headaches.       Objective:   Physical Exam Constitutional:      Appearance: Normal appearance.  HENT:     Right Ear: Tympanic membrane normal.     Left Ear: Tympanic membrane normal.     Nose: Congestion and rhinorrhea present.     Mouth/Throat:     Pharynx: No oropharyngeal exudate or posterior oropharyngeal erythema.  Cardiovascular:     Rate and Rhythm: Normal rate and regular rhythm.     Heart sounds: Normal heart sounds.  Pulmonary:     Effort: Pulmonary effort is normal.     Breath sounds: Normal breath sounds. No wheezing, rhonchi or rales.   Abdominal:     General: Abdomen is flat.     Tenderness: There is no abdominal tenderness.  Neurological:     General: No focal deficit present.     Mental Status: He is alert and oriented to person, place, and time.  Psychiatric:        Mood and Affect: Mood normal.        Behavior: Behavior normal.     BP 128/78   Pulse 73   Temp (!) 97.3 F (36.3 C) (Oral)   Ht 5' 1 (1.549 m)   Wt 138 lb (62.6 kg)   SpO2 96%   BMI 26.07 kg/m        Assessment & Plan:   Chase [name] in today with chief complaint of No chief complaint on file.   1. Indigestion Avoid spicy foods Do not eat 2 hours prior to bedtime  - omeprazole  (PRILOSEC) 20 MG capsule; Take 1 capsule (20 mg total) by mouth daily.  Dispense: 30 capsule; Refill: 3  2. URI with cough and congestion (Primary)  1. Take meds as prescribed 2. Use a cool mist humidifier especially during the winter months and when heat has been humid. 3. Use saline nose sprays frequently 4. Saline irrigations of the nose can be very helpful if done frequently.  * 4X daily for 1 week*  * Use of a nettie pot can be helpful with this. Follow directions with this* 5. Drink plenty of fluids 6.  Keep thermostat turn down low 7.For any cough or congestion- tesalon perles 8. For fever or aces or pains- take tylenol  or ibuprofen appropriate for age and weight.  * for fevers greater than 101 orally you may alternate ibuprofen and tylenol  every  3 hours.    - azithromycin  (ZITHROMAX  Z-PAK) 250 MG tablet; As directed  Dispense: 6 tablet; Refill: 0 - benzonatate  (TESSALON ) 100 MG capsule; Take 1 capsule (100 mg total) by mouth 2 (two) times daily as needed for cough.  Dispense: 20 capsule; Refill: 0 - predniSONE  (DELTASONE ) 20 MG tablet; Take 2 tablets (40 mg total) by mouth daily with breakfast for 5 days. 2 po daily for 5 days  Dispense: 10 tablet; Refill: 0    The above assessment and management plan was discussed with the patient. The  patient verbalized understanding of and has agreed to the management plan. Patient is aware to call the clinic if symptoms persist or worsen. Patient is aware when to return to the clinic for a follow-up visit. Patient educated on when it is appropriate to go to the emergency department.   Mary-Margaret Gladis, FNP

## 2023-06-08 NOTE — Patient Instructions (Signed)

## 2023-07-21 ENCOUNTER — Encounter (INDEPENDENT_AMBULATORY_CARE_PROVIDER_SITE_OTHER): Payer: Self-pay | Admitting: *Deleted

## 2023-08-06 ENCOUNTER — Telehealth: Payer: Self-pay | Admitting: *Deleted

## 2023-08-06 NOTE — Telephone Encounter (Signed)
  Procedure: COLONOSCOPY  Height: 5'2 Weight: 138LBS        Have you had a colonoscopy before?  03/22/19, Dr. Darrick Penna  Do you have family history of colon cancer?  no  Do you have a family history of polyps? no  Previous colonoscopy with polyps removed?   Do you have a history colorectal cancer?   no  Are you diabetic?  Yes type 2  Do you have a prosthetic or mechanical heart valve? no  Do you have a pacemaker/defibrillator?   no  Have you had endocarditis/atrial fibrillation?  no  Do you use supplemental oxygen/CPAP?  no  Have you had joint replacement within the last 12 months?  no  Do you tend to be constipated or have to use laxatives?  yes   Do you have history of alcohol use? If yes, how much and how often.  Yes 9 years ago but stopped  Do you have history or are you using drugs? If yes, what do are you  using?  no  Have you ever had a stroke/heart attack?  no  Have you ever had a heart or other vascular stent placed,?no  Do you take weight loss medication? no   Do you take any blood-thinning medications such as: (Plavix, aspirin, Coumadin, Aggrenox, Brilinta, Xarelto, Eliquis, Pradaxa, Savaysa or Effient)? no  If yes we need the name, milligram, dosage and who is prescribing doctor:               Current Outpatient Medications  Medication Sig Dispense Refill   atorvastatin (LIPITOR) 40 MG tablet Take 1 tablet (40 mg total) by mouth every evening. 100 tablet 2   blood glucose meter kit and supplies KIT Dispense based on patient and insurance preference. Use up to four times daily as directed. 1 each 0   Blood Glucose Monitoring Suppl (ONETOUCH VERIO) w/Device KIT Take 1 each by mouth in the morning and at bedtime. Use to test blood sugar daily 1 kit 1   empagliflozin (JARDIANCE) 25 MG TABS tablet Take 1 tablet (25 mg total) by mouth daily before breakfast. 90 tablet 3   fluticasone (FLONASE) 50 MCG/ACT nasal spray Place 2 sprays into both nostrils daily. 16 g 0    glucose blood test strip Use as instructed to test blood sugar once daily.  Please fill One touch verio test strips. DX:E11.69 100 each 12   lisinopril (ZESTRIL) 5 MG tablet Take 1 tablet (5 mg total) by mouth daily. 90 tablet 3   meclizine (ANTIVERT) 25 MG tablet Take 1 tablet (25 mg total) by mouth 3 (three) times daily as needed for dizziness. 30 tablet 1   metFORMIN (GLUCOPHAGE) 500 MG tablet Take 2 tablets (1,000 mg total) by mouth 2 (two) times daily with a meal. 360 tablet 3   omeprazole (PRILOSEC) 20 MG capsule Take 1 capsule (20 mg total) by mouth daily. 30 capsule 3   ondansetron (ZOFRAN-ODT) 4 MG disintegrating tablet Take 1 tablet (4 mg total) by mouth every 8 (eight) hours as needed for nausea or vomiting. 90 tablet 1   OneTouch Delica Lancets 33G MISC Use to check blood sugar 2 times daily. E11.69 100 each 4   traZODone (DESYREL) 100 MG tablet Take 1 tablet (100 mg total) by mouth at bedtime. 90 tablet 3   No current facility-administered medications for this visit.    No Known Allergies

## 2023-08-14 ENCOUNTER — Ambulatory Visit: Payer: Medicare HMO | Admitting: Family Medicine

## 2023-08-14 ENCOUNTER — Encounter: Payer: Self-pay | Admitting: Family Medicine

## 2023-08-14 VITALS — BP 122/76 | HR 68 | Ht 61.0 in | Wt 140.0 lb

## 2023-08-14 DIAGNOSIS — B3742 Candidal balanitis: Secondary | ICD-10-CM | POA: Diagnosis not present

## 2023-08-14 DIAGNOSIS — E1159 Type 2 diabetes mellitus with other circulatory complications: Secondary | ICD-10-CM | POA: Diagnosis not present

## 2023-08-14 DIAGNOSIS — Z7984 Long term (current) use of oral hypoglycemic drugs: Secondary | ICD-10-CM | POA: Diagnosis not present

## 2023-08-14 DIAGNOSIS — E785 Hyperlipidemia, unspecified: Secondary | ICD-10-CM | POA: Diagnosis not present

## 2023-08-14 DIAGNOSIS — I152 Hypertension secondary to endocrine disorders: Secondary | ICD-10-CM | POA: Diagnosis not present

## 2023-08-14 DIAGNOSIS — E1169 Type 2 diabetes mellitus with other specified complication: Secondary | ICD-10-CM

## 2023-08-14 LAB — BAYER DCA HB A1C WAIVED: HB A1C (BAYER DCA - WAIVED): 6.3 % — ABNORMAL HIGH (ref 4.8–5.6)

## 2023-08-14 MED ORDER — KETOROLAC TROMETHAMINE 30 MG/ML IJ SOLN
30.0000 mg | Freq: Once | INTRAMUSCULAR | Status: AC
  Administered 2023-08-14: 30 mg via INTRAMUSCULAR

## 2023-08-14 MED ORDER — NYSTATIN 100000 UNIT/GM EX CREA: 1.0000 | TOPICAL_CREAM | Freq: Two times a day (BID) | CUTANEOUS | 1 refills | Status: AC

## 2023-08-14 NOTE — Addendum Note (Signed)
 Addended by: Arville Care on: 08/14/2023 11:41 AM   Modules accepted: Orders

## 2023-08-14 NOTE — Progress Notes (Addendum)
 BP 122/76   Pulse 68   Ht 5\' 1"  (1.549 m)   Wt 140 lb (63.5 kg)   SpO2 98%   BMI 26.45 kg/m    Subjective:   Patient ID: Chase [name], male    DOB: 11-Aug-1950, 73 y.o.   MRN: [id number]  HPI: Chase [name] is a 73 y.o. male presenting on 08/14/2023 for Medical Management of Chronic Issues, Diabetes, Hyperlipidemia, and Hypertension   HPI Type 2 diabetes mellitus Patient comes in today for recheck of his diabetes. Patient has been currently taking metformin and Jardiance. Patient is currently on an ACE inhibitor/ARB. Patient has not seen an ophthalmologist this year. Patient denies any new issues with their feet. The symptom started onset as an adult hypertension and hyperlipidemia ARE RELATED TO DM   Hypertension Patient is currently on Jardiance and lisinopril, and their blood pressure today is 122/76. Patient denies any lightheadedness or dizziness. Patient denies headaches, blurred vision, chest pains, shortness of breath, or weakness. Denies any side effects from medication and is content with current medication.   Hyperlipidemia Patient is coming in for recheck of his hyperlipidemia. The patient is currently taking atorvastatin. They deny any issues with myalgias or history of liver damage from it. They deny any focal numbness or weakness or chest pain.   Relevant past medical, surgical, family and social history reviewed and updated as indicated. Interim medical history since our last visit reviewed. Allergies and medications reviewed and updated.  Review of Systems  Constitutional:  Negative for chills and fever.  Eyes:  Negative for visual disturbance.  Respiratory:  Negative for shortness of breath and wheezing.   Cardiovascular:  Negative for chest pain and leg swelling.  Musculoskeletal:  Negative for back pain and gait problem.  Skin:  Negative for rash.  Neurological:  Negative for dizziness and light-headedness.  All other systems reviewed and are  negative.   Per HPI unless specifically indicated above   Allergies as of 08/14/2023   No Known Allergies      Medication List        Accurate as of August 14, 2023 11:40 AM. If you have any questions, ask your nurse or doctor.          STOP taking these medications    fluticasone 50 MCG/ACT nasal spray Commonly known as: FLONASE Stopped by: Elige Radon Hani Patnode   meclizine 25 MG tablet Commonly known as: ANTIVERT Stopped by: Elige Radon Burris Matherne   omeprazole 20 MG capsule Commonly known as: PRILOSEC Stopped by: Elige Radon Melania Kirks   ondansetron 4 MG disintegrating tablet Commonly known as: ZOFRAN-ODT Stopped by: Elige Radon Reyaan Thoma   traZODone 100 MG tablet Commonly known as: DESYREL Stopped by: Elige Radon Lilith Solana       TAKE these medications    atorvastatin 40 MG tablet Commonly known as: LIPITOR Take 1 tablet (40 mg total) by mouth every evening.   blood glucose meter kit and supplies Kit Dispense based on patient and insurance preference. Use up to four times daily as directed.   empagliflozin 25 MG Tabs tablet Commonly known as: Jardiance Take 1 tablet (25 mg total) by mouth daily before breakfast.   glucose blood test strip Use as instructed to test blood sugar once daily.  Please fill One touch verio test strips. DX:E11.69   lisinopril 5 MG tablet Commonly known as: ZESTRIL Take 1 tablet (5 mg total) by mouth daily.   metFORMIN 500 MG tablet Commonly known as: GLUCOPHAGE Take 2  tablets (1,000 mg total) by mouth 2 (two) times daily with a meal.   nystatin cream Commonly known as: MYCOSTATIN Apply 1 Application topically 2 (two) times daily for 14 days. Started by: Elige Radon Shantrice Rodenberg   OneTouch Delica Lancets 33G Misc Use to check blood sugar 2 times daily. E11.69   OneTouch Verio w/Device Kit Take 1 each by mouth in the morning and at bedtime. Use to test blood sugar daily   tamsulosin 0.4 MG Caps capsule Commonly known as: FLOMAX Take  0.4 mg by mouth daily.         Objective:   BP 122/76   Pulse 68   Ht 5\' 1"  (1.549 m)   Wt 140 lb (63.5 kg)   SpO2 98%   BMI 26.45 kg/m   Wt Readings from Last 3 Encounters:  08/14/23 140 lb (63.5 kg)  06/08/23 138 lb (62.6 kg)  05/15/23 138 lb (62.6 kg)    Physical Exam Vitals and nursing note reviewed.  Constitutional:      General: He is not in acute distress.    Appearance: He is well-developed. He is not diaphoretic.  Eyes:     General: No scleral icterus.    Conjunctiva/sclera: Conjunctivae normal.  Neck:     Thyroid: No thyromegaly.  Cardiovascular:     Rate and Rhythm: Normal rate and regular rhythm.     Heart sounds: Normal heart sounds. No murmur heard. Pulmonary:     Effort: Pulmonary effort is normal. No respiratory distress.     Breath sounds: Normal breath sounds. No wheezing.  Musculoskeletal:        General: No swelling. Normal range of motion.     Cervical back: Neck supple.  Lymphadenopathy:     Cervical: No cervical adenopathy.  Skin:    General: Skin is warm and dry.     Findings: No rash.  Neurological:     Mental Status: He is alert and oriented to person, place, and time.     Coordination: Coordination normal.  Psychiatric:        Behavior: Behavior normal.       Assessment & Plan:   Problem List Items Addressed This Visit       Cardiovascular and Mediastinum   Hypertension associated with diabetes (HCC)     Endocrine   Type 2 diabetes mellitus with other specified complication (HCC) - Primary   Relevant Orders   Bayer DCA Hb A1c Waived   Hyperlipidemia associated with type 2 diabetes mellitus (HCC)   Other Visit Diagnoses       Candidal balanitis       Relevant Medications   ketorolac (TORADOL) 30 MG/ML injection 30 mg (Start on 08/14/2023 11:45 AM)   nystatin cream (MYCOSTATIN)     A1c looks good at 6.3.  No changes blood pressure looks good today.  No changes  Has candidal balanitis, will do topical nystatin but  also gave him a Toradol injection to help with pain and inflammation.  Follow up plan: Return in about 3 months (around 11/14/2023), or if symptoms worsen or fail to improve, for Diabetes and hypertension.  Counseling provided for all of the vaccine components Orders Placed This Encounter  Procedures   Bayer DCA Hb A1c Waived    Arville Care, MD [facility] 08/14/2023, 11:40 AM

## 2023-08-17 ENCOUNTER — Telehealth: Payer: Self-pay

## 2023-08-17 ENCOUNTER — Other Ambulatory Visit (HOSPITAL_COMMUNITY): Payer: Self-pay

## 2023-08-17 ENCOUNTER — Telehealth: Payer: Self-pay | Admitting: *Deleted

## 2023-08-17 NOTE — Telephone Encounter (Signed)
 Pharmacy Patient Advocate Encounter   Received notification from Pt Calls Messages that prior authorization for Nystatin cream is required/requested.   Insurance verification completed.   The patient is insured through [employer] .   Per test claim: Refill too soon. PA is not needed at this time. Medication was filled 08/15/23. Next eligible fill date is 08/26/23.

## 2023-08-17 NOTE — Telephone Encounter (Signed)
 Per the pharmacy the insurance doesn't want to cover for this large amount the 180 grams-TRY for PA   Will route to PA team

## 2023-08-18 DIAGNOSIS — C61 Malignant neoplasm of prostate: Secondary | ICD-10-CM | POA: Diagnosis not present

## 2023-08-18 DIAGNOSIS — N5201 Erectile dysfunction due to arterial insufficiency: Secondary | ICD-10-CM | POA: Diagnosis not present

## 2023-09-01 NOTE — Telephone Encounter (Signed)
error 

## 2023-09-30 NOTE — Telephone Encounter (Signed)
 ASA 3 - OV recommended.

## 2023-10-09 NOTE — Progress Notes (Unsigned)
 Referring Provider: Dettinger, Lucio Sabin, MD Primary Care Physician:  Dettinger, Lucio Sabin, MD Primary Gastroenterologist:  Dr. Mordechai April  Chief Complaint  Patient presents with   Colonoscopy    Colonoscopy screening. Having diarrhea and nausea and reflux. Feels bloating all the time.  His lower right side is hurting also.     HPI:   Chase [name] is a 73 y.o. male presenting today at the request of Dettinger, Lucio Sabin, MD for consult colonoscopy.  Noted on triage form, patient stated he intended to be constipated or needed to use laxatives.  Last colonoscopy 03/22/2019: - NINE 3 to 6 mm polyps in the rectum, in the descending colon, at the hepatic flexure and in the cecum, removed with a cold snare. Resected and retrieved.  - Internal hemorrhoids.  - Tortuous colon. - Pathology with sessile serrated polyp, tubular adenomas, hyperplastic polyps.  - Recommended 3-year surveillance.    Today: No appetite. Eating small amounts. Nausea without vomiting. Feeling weak. Having reflux. Not taking anything right now. Having issues with foods getting hung in esophagus. Does ok with liquids. Worsening over the last 3 years. No prior EGD.     RLQ abdominal pain. Constant. Present for about 1 year, but worsening. Increasing pain with certain movement or even sitting. Not affected by meals. Some improvement after a BM, but never resolves. Has been worsening recently.  Described as stabbing.   Previously with constipation, but now having diarrhea since May 4th.  Has some mucous in his stool. No brbpr or melena.  Having 6-7 Bms per day.  Having some bowel movements in the middle of the night as well.   Went to [place] for 3 weeks, just came back 3 days ago.  No recent antibiotics.    Lost 6 lbs in the last month because he can't eat.      Past Medical History:  Diagnosis Date   Arthritis    Cancer (HCC)    prostate 2017   Depression    Diabetes mellitus without complication (HCC)     Headache    High cholesterol    Hypertension    Inguinal hernia    rt   Neck pain    Numbness and tingling in hands    Urinary frequency     Past Surgical History:  Procedure Laterality Date   ANTERIOR CERVICAL DECOMP/DISCECTOMY FUSION N/A 04/04/2015   Procedure: ANTERIOR CERVICAL DECOMPRESSION/DISCECTOMY FUSION PLATING BONEGRAFT CERVICAL FOUR-FIVE CERVICAL FIVE-SIX;  Surgeon: Audie Bleacher, MD;  Location: [facility];  Service: Neurosurgery;  Laterality: N/A;   BACK SURGERY     lower back with Dr. Michale Age   COLONOSCOPY WITH PROPOFOL  N/A 03/22/2019   Procedure: COLONOSCOPY WITH PROPOFOL ;  Surgeon: Alyce Jubilee, MD;  Location: [facility];  Service: Endoscopy;  Laterality: N/A;  8:30am   INGUINAL HERNIA REPAIR Right 12/22/2018   Procedure: HERNIA REPAIR INGUINAL ADULT WITH MESH;  Surgeon: Alanda Allegra, MD;  Location: [facility];  Service: General;  Laterality: Right;   POLYPECTOMY  03/22/2019   Procedure: POLYPECTOMY;  Surgeon: Alyce Jubilee, MD;  Location: [facility];  Service: Endoscopy;;  Cold snare polypectomy cecal, hepatic flexure polyps x 2,descending colon  polyps x 3,  and rectal polyps x 3    SHOULDER ARTHROSCOPY Right    UPPER EXTREMITY VENOGRAPHY N/A 07/22/2016   Procedure: Upper Extremity Venography - bilateral arm;  Surgeon: Margherita Shell, MD;  Location: [facility];  Service: Cardiovascular;  Laterality: N/A;  Current Outpatient Medications  Medication Sig Dispense Refill   atorvastatin  (LIPITOR) 40 MG tablet Take 1 tablet (40 mg total) by mouth every evening. 100 tablet 2   empagliflozin  (JARDIANCE ) 25 MG TABS tablet Take 1 tablet (25 mg total) by mouth daily before breakfast. 90 tablet 3   lisinopril  (ZESTRIL ) 5 MG tablet Take 1 tablet (5 mg total) by mouth daily. 90 tablet 3   metFORMIN  (GLUCOPHAGE ) 500 MG tablet Take 2 tablets (1,000 mg total) by mouth 2 (two) times daily with a meal. 360 tablet 3   pantoprazole (PROTONIX) 40 MG tablet Take 1  tablet (40 mg total) by mouth daily before breakfast. 30 tablet 3   tamsulosin  (FLOMAX ) 0.4 MG CAPS capsule Take 0.4 mg by mouth daily.     blood glucose meter kit and supplies KIT Dispense based on patient and insurance preference. Use up to four times daily as directed. (Patient not taking: Reported on 10/12/2023) 1 each 0   Blood Glucose Monitoring Suppl (ONETOUCH VERIO) w/Device KIT Take 1 each by mouth in the morning and at bedtime. Use to test blood sugar daily (Patient not taking: Reported on 10/12/2023) 1 kit 1   glucose blood test strip Use as instructed to test blood sugar once daily.  Please fill One touch verio test strips. DX:E11.69 (Patient not taking: Reported on 10/12/2023) 100 each 12   OneTouch Delica Lancets 33G MISC Use to check blood sugar 2 times daily. E11.69 (Patient not taking: Reported on 10/12/2023) 100 each 4   No current facility-administered medications for this visit.    Allergies as of 10/12/2023   (No Known Allergies)    Family History  Problem Relation Age of Onset   Colon cancer Neg Hx    Colon polyps Neg Hx     Social History   Socioeconomic History   Marital status: Married    Spouse name: Not on file   Number of children: 11   Years of education: Not on file   Highest education level: Not on file  Occupational History   Not on file  Tobacco Use   Smoking status: Former    Current packs/day: 0.00    Average packs/day: 0.3 packs/day for 40.0 years (10.0 ttl pk-yrs)    Types: Cigarettes    Start date: 12/19/1977    Quit date: 12/19/2017    Years since quitting: 5.8   Smokeless tobacco: Never   Tobacco comments:    quit 11/2017 (as of 12/08/2018)  Vaping Use   Vaping status: Never Used  Substance and Sexual Activity   Alcohol use: Not Currently    Comment: no ETOH since since 2016 (as of 12/08/2018)   Drug use: No   Sexual activity: Not Currently  Other Topics Concern   Not on file  Social History Narrative   Right handed   livs with wife in  a one story   Social Drivers of Health   Financial Resource Strain: Low Risk  (12/09/2022)   Overall Financial Resource Strain (CARDIA)    Difficulty of Paying Living Expenses: Not hard at all  Food Insecurity: No Food Insecurity (12/09/2022)   Hunger Vital Sign    Worried About Running Out of Food in the Last Year: Never true    Ran Out of Food in the Last Year: Never true  Transportation Needs: No Transportation Needs (12/09/2022)   PRAPARE - Administrator, Civil Service (Medical): No    Lack of Transportation (Non-Medical): No  Physical Activity:  Inactive (12/09/2022)   Exercise Vital Sign    Days of Exercise per Week: 0 days    Minutes of Exercise per Session: 0 min  Stress: No Stress Concern Present (12/09/2022)   [employer] of Occupational Health - Occupational Stress Questionnaire    Feeling of Stress : Not at all  Social Connections: Moderately Isolated (12/09/2022)   Social Connection and Isolation Panel [NHANES]    Frequency of Communication with Friends and Family: More than three times a week    Frequency of Social Gatherings with Friends and Family: More than three times a week    Attends Religious Services: Never    Database administrator or Organizations: No    Attends Banker Meetings: Never    Marital Status: Married  Catering manager Violence: Not At Risk (12/09/2022)   Humiliation, Afraid, Rape, and Kick questionnaire    Fear of Current or Ex-Partner: No    Emotionally Abused: No    Physically Abused: No    Sexually Abused: No    Review of Systems: Gen: Denies any fever, chills, cold or flulike symptoms. CV: Denies chest pain, heart palpitations. Resp: Denies shortness of breath, cough. GI: See HPI GU : Admits dysuria. MS: Admits to chronic joint pains Derm: Denies rash. Psych: Denies depression, anxiety. Heme: HPI  Physical Exam: BP 139/80 (BP Location: Left Arm, Patient Position: Sitting, Cuff Size: Normal)   Pulse 62   Temp  97.9 F (36.6 C) (Temporal)   Ht 5\' 2"  (1.575 m)   Wt 135 lb 3.2 oz (61.3 kg)   BMI 24.73 kg/m  General:   Alert and oriented. Pleasant and cooperative. Well-nourished and well-developed.  Developed dizziness with sensation of room spinning when sitting up on exam table.  Per patient, this is a chronic problem. Head:  Normocephalic and atraumatic. Eyes:  Without icterus, sclera clear and conjunctiva pink.  Ears:  Normal auditory acuity. Lungs:  Clear to auscultation bilaterally. No wheezes, rales, or rhonchi. No distress.  Heart:  S1, S2 present without murmurs appreciated.  Abdomen:  +BS, soft, and non-distended.  Fairly significant TTP in RLQ and moderate TTP in epigastric region.  No HSM noted. No guarding or rebound. No masses appreciated.  Rectal:  Deferred  Msk:  Symmetrical without gross deformities. Normal posture. Extremities:  Without edema. Neurologic:  Alert and  oriented x4;  grossly normal neurologically. Skin:  Intact without significant lesions or rashes. Psych:  Normal mood and affect.    Assessment:  73 year old male with history of prostate cancer, diabetes, HTN, HLD, arthritis, depression, right inguinal hernia s/p repair in 2020, adenomatous colon polyps overdue for surveillance, presenting today to discuss scheduling colonoscopy at the request of Dr. Steen Eden, the patient also has multiple GI complaints including abdominal pain, diarrhea, GERD, nausea, early satiety, dysphagia, weight loss.  RLQ abdominal pain: Present for the last year, but worsening recently with fairly significant RLQ tenderness to palpation on exam today.  No rebound or guarding and no hernia noted.  Differentials include colitis in the setting of diarrhea, appendicitis, scar tissue/adhesions related to prior right inguinal hernia repair.  Needs CT for further evaluation. Will also update labs, stool studies, and check for UTI as he also reports dysuria.     Diarrhea/change in bowel  habits: New onset diarrhea starting 10/04/2023 while in [place].  Reporting multiple bowel movements per day as well as nocturnal stools.  No recent antibiotics.  Previously with constipation.  Denies BRBPR or melena, but is reporting  mucus in the stool.  In setting of recent travel, concern for infectious diarrhea.  If infection is ruled out, we will proceed with colonoscopy for further evaluation as he is also overdue for surveillance.    Early satiety/nausea: Difficult historian, but patient reporting few year history of early satiety and nausea with worsening symptoms recently leading to weight loss.  Has moderate tenderness to palpation in the epigastric area on exam today.  Differential includes uncontrolled GERD, gastritis, duodenitis, H. pylori, gastric outlet obstruction, malignancy.  Needs EGD for further evaluation.   GERD: Chronic.  Uncontrolled.  Not currently on anything for this.  Will start pantoprazole daily.   Dysphagia: Progressive solid food dysphagia over the last few years.  Could have esophageal web, ring, stricture in the setting of GERD, but unable to rule out malignancy.  Needs EGD for further evaluation.   History of adenomatous colon polyps Overdue for surveillance.  Last colonoscopy October 2021 benign 3 x 6 mm polyps removed.  Pathology with sessile serrated polyp, tubular adenomas, and hyperplastic polyps.  Recommended 3-year surveillance.    Plan:  CT A/P with contrast stat CBC, CMP, C. difficile GDH and toxin A/B, GI pathogen panel, Giardia, Cryptosporidium, urine analysis/culture. Start pantoprazole 40 mg daily. Continue soft diet due to dysphagia. Recommended adding 2-3 protein shakes daily. Avoid NSAIDs. Needs EGD and colonoscopy once acute processes is ruled out with CT and labs/stool testing.   Shana Daring, PA-C [facility] 10/12/2023

## 2023-10-12 ENCOUNTER — Ambulatory Visit (INDEPENDENT_AMBULATORY_CARE_PROVIDER_SITE_OTHER): Admitting: Gastroenterology

## 2023-10-12 ENCOUNTER — Telehealth: Payer: Self-pay | Admitting: *Deleted

## 2023-10-12 ENCOUNTER — Ambulatory Visit (HOSPITAL_COMMUNITY)
Admission: RE | Admit: 2023-10-12 | Discharge: 2023-10-12 | Disposition: A | Discharge status: Home / Self Care | Source: Ambulatory Visit | Attending: Gastroenterology | Admitting: Gastroenterology

## 2023-10-12 ENCOUNTER — Encounter: Payer: Self-pay | Admitting: Gastroenterology

## 2023-10-12 VITALS — BP 139/80 | HR 62 | Temp 97.9°F | Ht 62.0 in | Wt 135.2 lb

## 2023-10-12 DIAGNOSIS — R1031 Right lower quadrant pain: Secondary | ICD-10-CM | POA: Insufficient documentation

## 2023-10-12 DIAGNOSIS — R634 Abnormal weight loss: Secondary | ICD-10-CM | POA: Insufficient documentation

## 2023-10-12 DIAGNOSIS — R6881 Early satiety: Secondary | ICD-10-CM | POA: Diagnosis not present

## 2023-10-12 DIAGNOSIS — Z860101 Personal history of adenomatous and serrated colon polyps: Secondary | ICD-10-CM | POA: Diagnosis not present

## 2023-10-12 DIAGNOSIS — Z860102 Personal history of hyperplastic colon polyps: Secondary | ICD-10-CM | POA: Diagnosis not present

## 2023-10-12 DIAGNOSIS — R1013 Epigastric pain: Secondary | ICD-10-CM

## 2023-10-12 DIAGNOSIS — R197 Diarrhea, unspecified: Secondary | ICD-10-CM | POA: Insufficient documentation

## 2023-10-12 DIAGNOSIS — K4090 Unilateral inguinal hernia, without obstruction or gangrene, not specified as recurrent: Secondary | ICD-10-CM | POA: Diagnosis not present

## 2023-10-12 DIAGNOSIS — R1310 Dysphagia, unspecified: Secondary | ICD-10-CM | POA: Diagnosis not present

## 2023-10-12 DIAGNOSIS — N400 Enlarged prostate without lower urinary tract symptoms: Secondary | ICD-10-CM | POA: Diagnosis not present

## 2023-10-12 DIAGNOSIS — R112 Nausea with vomiting, unspecified: Secondary | ICD-10-CM | POA: Diagnosis not present

## 2023-10-12 DIAGNOSIS — K219 Gastro-esophageal reflux disease without esophagitis: Secondary | ICD-10-CM | POA: Diagnosis not present

## 2023-10-12 DIAGNOSIS — R300 Dysuria: Secondary | ICD-10-CM

## 2023-10-12 DIAGNOSIS — K7689 Other specified diseases of liver: Secondary | ICD-10-CM | POA: Diagnosis not present

## 2023-10-12 MED ORDER — PANTOPRAZOLE SODIUM 40 MG PO TBEC: 40.0000 mg | DELAYED_RELEASE_TABLET | Freq: Every day | ORAL | 3 refills | Status: DC

## 2023-10-12 MED ORDER — IOHEXOL 300 MG/ML  SOLN
100.0000 mL | Freq: Once | INTRAMUSCULAR | Status: AC | PRN
  Administered 2023-10-12: 100 mL via INTRAVENOUS

## 2023-10-12 NOTE — Patient Instructions (Addendum)
 English:  We will get you scheduled for a CT scan of your abdomen and pelvis to evaluate your abdominal pain.  Please have blood work and stool testing completed at [facility].  This is in the two-story brick building across the street from [facility] emergency department on the second floor.  I would like to start you on pantoprazole 40 mg daily for acid reflux.  Take this medication first thing in the morning, at least 30 minutes before you eat.  Continue to follow a soft/liquid diet due to your swallowing problems.   I recommend that you add protein shakes 2-3 times a day to help with weight maintenance.  You will need an upper endoscopy and colonoscopy in the near future.  We will get this scheduled after your labs and CT study has been completed.  Avoid all NSAID products including ibuprofen, Aleve, Advil, BC powders, Goody powders, and anything that says "NSAID" on the package.   Spanish Instructions:  Le programaremos una tomografa computarizada de abdomen y pelvis para evaluar su dolor abdominal.  Por favor, hgase los anlisis de sangre y Health visitor en [facility]. [facility] centro se encuentra en el edificio de [place] de dos plantas frente al servicio de urgencias de [place], en el [place].  Me gustara empezar a administrarle pantoprazol 40 mg al da para el reflujo cido. Tmelo a primera hora de la maana, al menos 30 minutos antes de comer.  Contine con una dieta blanda/lquida debido a sus problemas para tragar.  Le recomiendo que aada batidos de protenas de 2 a 3 veces al da para ayudarle a Hydrologist.  Necesitar una endoscopia digestiva alta y Ebb Goldman colonoscopia prximamente. La programaremos despus de que se hayan completado sus anlisis y la tomografa computarizada.  Evite todos los productos AINE, como ibuprofeno, Aleve, Advil, polvos BC, polvos Goody y cualquier producto que diga "AINE" en el envase.  Shana Daring, PA-C [facility]

## 2023-10-12 NOTE — Telephone Encounter (Signed)
 PA approved via [employer] CPT Code: 16109 Description: CT ABDOMEN & PELVIS W/ Authorization Number: [id number] Case Number: [id number] Review Date: 10/12/2023 12:26:00 PM Expiration Date: 04/09/2024 Status: Your case has been Approved.   Called pt and spoke with daughter. She is aware of CT appt for today

## 2023-10-13 ENCOUNTER — Ambulatory Visit: Payer: Self-pay | Admitting: *Deleted

## 2023-10-13 DIAGNOSIS — R197 Diarrhea, unspecified: Secondary | ICD-10-CM | POA: Diagnosis not present

## 2023-10-13 DIAGNOSIS — R1031 Right lower quadrant pain: Secondary | ICD-10-CM | POA: Diagnosis not present

## 2023-10-13 DIAGNOSIS — R300 Dysuria: Secondary | ICD-10-CM | POA: Diagnosis not present

## 2023-10-13 LAB — POCT I-STAT CREATININE: Creatinine, Ser: 0.7 mg/dL (ref 0.61–1.24)

## 2023-10-15 LAB — COMPLETE METABOLIC PANEL WITHOUT GFR
AG Ratio: 2.2 (calc) (ref 1.0–2.5)
ALT: 15 U/L (ref 9–46)
AST: 12 U/L (ref 10–35)
Albumin: 4.3 g/dL (ref 3.6–5.1)
Alkaline phosphatase (APISO): 67 U/L (ref 35–144)
BUN/Creatinine Ratio: 21 (calc) (ref 6–22)
BUN: 12 mg/dL (ref 7–25)
CO2: 28 mmol/L (ref 20–32)
Calcium: 9.4 mg/dL (ref 8.6–10.3)
Chloride: 102 mmol/L (ref 98–110)
Creat: 0.57 mg/dL — ABNORMAL LOW (ref 0.70–1.28)
Globulin: 2 g/dL (ref 1.9–3.7)
Glucose, Bld: 179 mg/dL — ABNORMAL HIGH (ref 65–99)
Potassium: 4.2 mmol/L (ref 3.5–5.3)
Sodium: 139 mmol/L (ref 135–146)
Total Bilirubin: 0.5 mg/dL (ref 0.2–1.2)
Total Protein: 6.3 g/dL (ref 6.1–8.1)

## 2023-10-15 LAB — URINE CULTURE
MICRO NUMBER:: 16453859
SPECIMEN QUALITY:: ADEQUATE

## 2023-10-15 LAB — URINALYSIS
Bilirubin Urine: NEGATIVE
Hgb urine dipstick: NEGATIVE
Ketones, ur: NEGATIVE
Nitrite: NEGATIVE
Protein, ur: NEGATIVE
Specific Gravity, Urine: 1.036 — ABNORMAL HIGH (ref 1.001–1.035)
pH: 5.5 (ref 5.0–8.0)

## 2023-10-15 LAB — CBC WITH DIFFERENTIAL/PLATELET
Absolute Lymphocytes: 784 {cells}/uL — ABNORMAL LOW (ref 850–3900)
Absolute Monocytes: 288 {cells}/uL (ref 200–950)
Basophils Absolute: 48 {cells}/uL (ref 0–200)
Basophils Relative: 1.2 %
Eosinophils Absolute: 228 {cells}/uL (ref 15–500)
Eosinophils Relative: 5.7 %
HCT: 48.5 % (ref 38.5–50.0)
Hemoglobin: 16.3 g/dL (ref 13.2–17.1)
MCH: 29.7 pg (ref 27.0–33.0)
MCHC: 33.6 g/dL (ref 32.0–36.0)
MCV: 88.5 fL (ref 80.0–100.0)
MPV: 9.5 fL (ref 7.5–12.5)
Monocytes Relative: 7.2 %
Neutro Abs: 2652 {cells}/uL (ref 1500–7800)
Neutrophils Relative %: 66.3 %
Platelets: 220 10*3/uL (ref 140–400)
RBC: 5.48 10*6/uL (ref 4.20–5.80)
RDW: 13.6 % (ref 11.0–15.0)
Total Lymphocyte: 19.6 %
WBC: 4 10*3/uL (ref 3.8–10.8)

## 2023-10-20 LAB — CRYPTOSPORIDIUM ANTIGEN, EIA
Specimen Quality:: ADEQUATE
micro Number:: 16459523

## 2023-10-20 LAB — GIARDIA ANTIGEN
MICRO NUMBER:: 16459524
RESULT:: NOT DETECTED
SPECIMEN QUALITY:: ADEQUATE

## 2023-10-20 LAB — GASTROINTESTINAL PATHOGEN PNL
CampyloBacter Group: NOT DETECTED
Norovirus GI/GII: NOT DETECTED
Rotavirus A: NOT DETECTED
Salmonella species: NOT DETECTED
Shiga Toxin 1: NOT DETECTED
Shiga Toxin 2: NOT DETECTED
Shigella Species: NOT DETECTED
Vibrio Group: NOT DETECTED
Yersinia enterocolitica: NOT DETECTED

## 2023-10-20 LAB — C. DIFFICILE GDH AND TOXIN A/B
GDH ANTIGEN: NOT DETECTED
MICRO NUMBER:: 16458796
SPECIMEN QUALITY:: ADEQUATE
TOXIN A AND B: NOT DETECTED

## 2023-11-12 ENCOUNTER — Encounter: Payer: Self-pay | Admitting: *Deleted

## 2023-11-12 ENCOUNTER — Other Ambulatory Visit: Payer: Self-pay | Admitting: *Deleted

## 2023-11-12 MED ORDER — PEG 3350-KCL-NA BICARB-NACL 420 G PO SOLR: 4000.0000 mL | Freq: Once | ORAL | 0 refills | Status: AC

## 2023-11-16 ENCOUNTER — Encounter: Payer: Self-pay | Admitting: Family Medicine

## 2023-11-16 ENCOUNTER — Ambulatory Visit: Admitting: Family Medicine

## 2023-11-16 VITALS — BP 123/75 | HR 68 | Temp 97.4°F | Ht 62.0 in | Wt 137.2 lb

## 2023-11-16 DIAGNOSIS — I152 Hypertension secondary to endocrine disorders: Secondary | ICD-10-CM | POA: Diagnosis not present

## 2023-11-16 DIAGNOSIS — E1159 Type 2 diabetes mellitus with other circulatory complications: Secondary | ICD-10-CM | POA: Diagnosis not present

## 2023-11-16 DIAGNOSIS — E1169 Type 2 diabetes mellitus with other specified complication: Secondary | ICD-10-CM

## 2023-11-16 DIAGNOSIS — E785 Hyperlipidemia, unspecified: Secondary | ICD-10-CM

## 2023-11-16 DIAGNOSIS — Z7984 Long term (current) use of oral hypoglycemic drugs: Secondary | ICD-10-CM | POA: Diagnosis not present

## 2023-11-16 LAB — BAYER DCA HB A1C WAIVED: HB A1C (BAYER DCA - WAIVED): 6.7 % — ABNORMAL HIGH (ref 4.8–5.6)

## 2023-11-16 NOTE — Progress Notes (Signed)
 BP 123/75   Pulse 68   Temp (!) 97.4 F (36.3 C)   Ht 5' 2 (1.575 m)   Wt 137 lb 4 oz (62.3 kg)   SpO2 96%   BMI 25.10 kg/m    Subjective:   Patient ID: Chase [name], male    DOB: 1950-07-14, 73 y.o.   MRN: [id number]  HPI: Chase [name] is a 73 y.o. male presenting on 11/16/2023 for Medical Management of Chronic Issues   HPI Type 2 diabetes mellitus Patient comes in today for recheck of his diabetes. Patient has been currently taking jardiance  and metformin . Patient is currently on an ACE inhibitor/ARB. Patient has not seen an ophthalmologist this year. Patient denies any new issues with their feet. The symptom started onset as an adult htn and hld ARE RELATED TO DM   Hypertension Patient is currently on lisinopril , and their blood pressure today is 123/75. Patient denies any lightheadedness or dizziness. Patient denies headaches, blurred vision, chest pains, shortness of breath, or weakness. Denies any side effects from medication and is content with current medication.   Hyperlipidemia Patient is coming in for recheck of his hyperlipidemia. The patient is currently taking atorvastatin . They deny any issues with myalgias or history of liver damage from it. They deny any focal numbness or weakness or chest pain.   Stomach issues Patient has been fighting some stomach issues and is seeing GI  Relevant past medical, surgical, family and social history reviewed and updated as indicated. Interim medical history since our last visit reviewed. Allergies and medications reviewed and updated.  Review of Systems  Constitutional:  Negative for chills and fever.  Eyes:  Negative for visual disturbance.  Respiratory:  Negative for shortness of breath and wheezing.   Cardiovascular:  Negative for chest pain and leg swelling.  Musculoskeletal:  Negative for back pain and gait problem.  Skin:  Negative for rash.  Neurological:  Negative for dizziness and light-headedness.  All  other systems reviewed and are negative.   Per HPI unless specifically indicated above   Allergies as of 11/16/2023   No Known Allergies      Medication List        Accurate as of November 16, 2023 12:08 PM. If you have any questions, ask your nurse or doctor.          atorvastatin  40 MG tablet Commonly known as: LIPITOR Take 1 tablet (40 mg total) by mouth every evening.   blood glucose meter kit and supplies Kit Dispense based on patient and insurance preference. Use up to four times daily as directed.   empagliflozin  25 MG Tabs tablet Commonly known as: Jardiance  Take 1 tablet (25 mg total) by mouth daily before breakfast.   glucose blood test strip Use as instructed to test blood sugar once daily.  Please fill One touch verio test strips. DX:E11.69   lisinopril  5 MG tablet Commonly known as: ZESTRIL  Take 1 tablet (5 mg total) by mouth daily.   metFORMIN  500 MG tablet Commonly known as: GLUCOPHAGE  Take 2 tablets (1,000 mg total) by mouth 2 (two) times daily with a meal.   OneTouch Delica Lancets 33G Misc Use to check blood sugar 2 times daily. E11.69   OneTouch Verio w/Device Kit Take 1 each by mouth in the morning and at bedtime. Use to test blood sugar daily   pantoprazole  40 MG tablet Commonly known as: PROTONIX  Take 1 tablet (40 mg total) by mouth daily before breakfast.   tamsulosin   0.4 MG Caps capsule Commonly known as: FLOMAX  Take 0.4 mg by mouth daily.         Objective:   BP 123/75   Pulse 68   Temp (!) 97.4 F (36.3 C)   Ht 5' 2 (1.575 m)   Wt 137 lb 4 oz (62.3 kg)   SpO2 96%   BMI 25.10 kg/m   Wt Readings from Last 3 Encounters:  11/16/23 137 lb 4 oz (62.3 kg)  10/12/23 135 lb 3.2 oz (61.3 kg)  08/14/23 140 lb (63.5 kg)    Physical Exam Vitals and nursing note reviewed.  Constitutional:      General: He is not in acute distress.    Appearance: He is well-developed. He is not diaphoretic.   Eyes:     General: No scleral  icterus.    Conjunctiva/sclera: Conjunctivae normal.   Neck:     Thyroid: No thyromegaly.   Cardiovascular:     Rate and Rhythm: Normal rate and regular rhythm.     Heart sounds: Normal heart sounds. No murmur heard. Pulmonary:     Effort: Pulmonary effort is normal. No respiratory distress.     Breath sounds: Normal breath sounds. No wheezing.  Abdominal:     General: Abdomen is flat. There is no distension.     Palpations: Abdomen is soft.     Tenderness: There is abdominal tenderness in the right lower quadrant and epigastric area. There is no right CVA tenderness, left CVA tenderness, guarding or rebound.   Musculoskeletal:        General: No swelling. Normal range of motion.     Cervical back: Neck supple.  Lymphadenopathy:     Cervical: No cervical adenopathy.   Skin:    General: Skin is warm and dry.     Findings: No rash.   Neurological:     Mental Status: He is alert and oriented to person, place, and time.     Coordination: Coordination normal.   Psychiatric:        Behavior: Behavior normal.       Assessment & Plan:   Problem List Items Addressed This Visit       Cardiovascular and Mediastinum   Hypertension associated with diabetes (HCC)   Relevant Orders   Bayer DCA Hb A1c Waived   CBC with Differential/Platelet   CMP14+EGFR   Lipid panel     Endocrine   Type 2 diabetes mellitus with other specified complication (HCC) - Primary   Relevant Orders   Bayer DCA Hb A1c Waived   CBC with Differential/Platelet   CMP14+EGFR   Lipid panel   Hyperlipidemia associated with type 2 diabetes mellitus (HCC)   Relevant Orders   Bayer DCA Hb A1c Waived   CBC with Differential/Platelet   CMP14+EGFR   Lipid panel    Will do bloodwork on the way out, continue current medications Follow up plan: Return in about 3 months (around 02/16/2024), or if symptoms worsen or fail to improve, for diabetes and htn and hld.  Counseling provided for all of the vaccine  components Orders Placed This Encounter  Procedures   Bayer DCA Hb A1c Waived   CBC with Differential/Platelet   CMP14+EGFR   Lipid panel    Jolyne Needs, MD [facility] 11/16/2023, 12:08 PM

## 2023-11-17 LAB — CBC WITH DIFFERENTIAL/PLATELET
Basophils Absolute: 0.1 10*3/uL (ref 0.0–0.2)
Basos: 1 %
EOS (ABSOLUTE): 0.3 10*3/uL (ref 0.0–0.4)
Eos: 8 %
Hematocrit: 48.1 % (ref 37.5–51.0)
Hemoglobin: 16 g/dL (ref 13.0–17.7)
Immature Grans (Abs): 0 10*3/uL (ref 0.0–0.1)
Immature Granulocytes: 0 %
Lymphocytes Absolute: 1 10*3/uL (ref 0.7–3.1)
Lymphs: 22 %
MCH: 30.1 pg (ref 26.6–33.0)
MCHC: 33.3 g/dL (ref 31.5–35.7)
MCV: 91 fL (ref 79–97)
Monocytes Absolute: 0.3 10*3/uL (ref 0.1–0.9)
Monocytes: 8 %
Neutrophils Absolute: 2.6 10*3/uL (ref 1.4–7.0)
Neutrophils: 60 %
Platelets: 203 10*3/uL (ref 150–450)
RBC: 5.31 x10E6/uL (ref 4.14–5.80)
RDW: 14 % (ref 11.6–15.4)
WBC: 4.3 10*3/uL (ref 3.4–10.8)

## 2023-11-17 LAB — LIPID PANEL
Chol/HDL Ratio: 4.8 ratio (ref 0.0–5.0)
Cholesterol, Total: 176 mg/dL (ref 100–199)
HDL: 37 mg/dL — ABNORMAL LOW (ref >39)
LDL Chol Calc (NIH): 106 mg/dL — ABNORMAL HIGH (ref 0–99)
Triglycerides: 186 mg/dL — ABNORMAL HIGH (ref 0–149)
VLDL Cholesterol Cal: 33 mg/dL (ref 5–40)

## 2023-11-17 LAB — CMP14+EGFR
ALT: 15 IU/L (ref 0–44)
AST: 12 IU/L (ref 0–40)
Albumin: 4.7 g/dL (ref 3.8–4.8)
Alkaline Phosphatase: 65 IU/L (ref 44–121)
BUN/Creatinine Ratio: 28 — ABNORMAL HIGH (ref 10–24)
BUN: 17 mg/dL (ref 8–27)
Bilirubin Total: 0.4 mg/dL (ref 0.0–1.2)
CO2: 17 mmol/L — ABNORMAL LOW (ref 20–29)
Calcium: 9.7 mg/dL (ref 8.6–10.2)
Chloride: 101 mmol/L (ref 96–106)
Creatinine, Ser: 0.61 mg/dL — ABNORMAL LOW (ref 0.76–1.27)
Globulin, Total: 1.8 g/dL (ref 1.5–4.5)
Glucose: 99 mg/dL (ref 70–99)
Potassium: 4.3 mmol/L (ref 3.5–5.2)
Sodium: 138 mmol/L (ref 134–144)
Total Protein: 6.5 g/dL (ref 6.0–8.5)
eGFR: 103 mL/min/{1.73_m2} (ref >59)

## 2023-11-18 ENCOUNTER — Encounter: Payer: Self-pay | Admitting: *Deleted

## 2023-11-18 ENCOUNTER — Telehealth: Payer: Self-pay | Admitting: *Deleted

## 2023-11-18 NOTE — Telephone Encounter (Signed)
 Pt needed to reschedule his procedure on 12/15/23. He has been rescheduled to 12/18/23 at 10 am. Updated instructions mailed to pt.

## 2023-11-20 ENCOUNTER — Ambulatory Visit: Payer: Self-pay | Admitting: Family Medicine

## 2023-11-30 ENCOUNTER — Encounter (HOSPITAL_COMMUNITY): Payer: Self-pay

## 2023-11-30 ENCOUNTER — Emergency Department (HOSPITAL_COMMUNITY)
Admission: EM | Admit: 2023-11-30 | Discharge: 2023-11-30 | Disposition: A | Discharge status: Home / Self Care | Attending: Emergency Medicine | Admitting: Emergency Medicine

## 2023-11-30 ENCOUNTER — Other Ambulatory Visit: Payer: Self-pay

## 2023-11-30 DIAGNOSIS — F32A Depression, unspecified: Secondary | ICD-10-CM | POA: Diagnosis not present

## 2023-11-30 DIAGNOSIS — E119 Type 2 diabetes mellitus without complications: Secondary | ICD-10-CM | POA: Insufficient documentation

## 2023-11-30 DIAGNOSIS — Z7984 Long term (current) use of oral hypoglycemic drugs: Secondary | ICD-10-CM | POA: Insufficient documentation

## 2023-11-30 DIAGNOSIS — G8929 Other chronic pain: Secondary | ICD-10-CM | POA: Insufficient documentation

## 2023-11-30 DIAGNOSIS — Z809 Family history of malignant neoplasm, unspecified: Secondary | ICD-10-CM | POA: Diagnosis not present

## 2023-11-30 DIAGNOSIS — N529 Male erectile dysfunction, unspecified: Secondary | ICD-10-CM | POA: Diagnosis not present

## 2023-11-30 DIAGNOSIS — Z87891 Personal history of nicotine dependence: Secondary | ICD-10-CM | POA: Diagnosis not present

## 2023-11-30 DIAGNOSIS — K219 Gastro-esophageal reflux disease without esophagitis: Secondary | ICD-10-CM | POA: Diagnosis not present

## 2023-11-30 DIAGNOSIS — F320 Major depressive disorder, single episode, mild: Secondary | ICD-10-CM | POA: Diagnosis not present

## 2023-11-30 DIAGNOSIS — M1990 Unspecified osteoarthritis, unspecified site: Secondary | ICD-10-CM | POA: Diagnosis not present

## 2023-11-30 DIAGNOSIS — R32 Unspecified urinary incontinence: Secondary | ICD-10-CM | POA: Diagnosis not present

## 2023-11-30 DIAGNOSIS — I10 Essential (primary) hypertension: Secondary | ICD-10-CM | POA: Insufficient documentation

## 2023-11-30 DIAGNOSIS — E1122 Type 2 diabetes mellitus with diabetic chronic kidney disease: Secondary | ICD-10-CM | POA: Diagnosis not present

## 2023-11-30 DIAGNOSIS — Z79899 Other long term (current) drug therapy: Secondary | ICD-10-CM | POA: Diagnosis not present

## 2023-11-30 DIAGNOSIS — E1142 Type 2 diabetes mellitus with diabetic polyneuropathy: Secondary | ICD-10-CM | POA: Diagnosis not present

## 2023-11-30 DIAGNOSIS — Z833 Family history of diabetes mellitus: Secondary | ICD-10-CM | POA: Diagnosis not present

## 2023-11-30 DIAGNOSIS — E785 Hyperlipidemia, unspecified: Secondary | ICD-10-CM | POA: Diagnosis not present
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
## 2023-11-30 NOTE — ED Notes (Signed)
 ED Provider at bedside.

## 2023-11-30 NOTE — ED Provider Notes (Signed)
 Provider Suicide Risk Assessment Note  Nursing Documentation C-SSRS RISK CATEGORY: Low Risk  ED Suicide Bundle Interventions (Must be higher or equal to C-SSRS Score): Low Risk Interventions implemented   Suicide Risk Assessment:   Based on my clinical evaluation, I estimate the patient to be at chronic low risk of self-harm in the current setting. This decision is based on my review of the chart including patient's history and current presentation, interview of the patient, mental status examination, and consideration of suicide risk including evaluating suicidal ideation, plan, intent, suicidal or self-harm behaviors, risk factors, and protective factors.  Patient has following modifiable risk factors for suicide: untreated depression and pain, medical illness (ie new dx of cancer) Patient has following non-modifiable or demographic risk factors for suicide:male gender Patient has the following protective factors against suicide: Supportive family Mitigation: Risk for suicide is being addressed by recommendations for: outpatient consult/treatment (low risk)     Yolande Lamar BROCKS, MD 11/30/23 2006

## 2023-11-30 NOTE — Discharge Instructions (Addendum)
 Le atendieron por depresin en urgencias.  En casa, por favor, comunquese con su familia a diario.  Consulte su MyChart en lnea para ver los [employer] pruebas que no haban dado resultados al salir de Oceanographer.  Reserve una cita de seguimiento con su mdico de Information systems manager en 2 o 3 das para obtener informacin sobre su visita. Acuda maana a urgencias de salud mental para obtener recursos adicionales.  Regrese de inmediato a urgencias si experimenta alguno de los siguientes sntomas: pensamientos de autolesin o cualquier otro sntoma preocupante.  Gracias por visitar landry Brake. Fue un placer atenderle hoy.  ---  You were seen for your depression in the emergency department.   At home, please touch base with family daily.    Check your MyChart online for the results of any tests that had not resulted by the time you left the emergency department.   Follow-up with your primary doctor in 2-3 days regarding your visit.  Go to behavioral health urgent care tomorrow to get additional resources.  Return immediately to the emergency department if you experience any of the following: Thoughts of harming yourself, or any other concerning symptoms.    Thank you for visiting our Emergency Department. It was a pleasure taking care of you today.

## 2023-11-30 NOTE — ED Triage Notes (Signed)
 Pt bib family (son and spouse) from home with c/o depression, lady came out to pt house today for a wellness visit and pt says he is depressed, has had thoughts cross his mind for the past three years of suicide, but says he has no intention on acting on it, says he can't do anything like he used to b/c he is disabled and just gets frustrated and depressed because of this. Pt denies intent or plan. Pt endorses depression.

## 2023-12-01 ENCOUNTER — Ambulatory Visit (HOSPITAL_COMMUNITY)
Admission: EM | Admit: 2023-12-01 | Discharge: 2023-12-01 | Disposition: A | Discharge status: Home / Self Care | Attending: Nurse Practitioner | Admitting: Nurse Practitioner

## 2023-12-01 DIAGNOSIS — M1990 Unspecified osteoarthritis, unspecified site: Secondary | ICD-10-CM | POA: Insufficient documentation

## 2023-12-01 DIAGNOSIS — R2681 Unsteadiness on feet: Secondary | ICD-10-CM | POA: Insufficient documentation

## 2023-12-01 DIAGNOSIS — E119 Type 2 diabetes mellitus without complications: Secondary | ICD-10-CM | POA: Insufficient documentation

## 2023-12-01 DIAGNOSIS — E7800 Pure hypercholesterolemia, unspecified: Secondary | ICD-10-CM | POA: Insufficient documentation

## 2023-12-01 DIAGNOSIS — F321 Major depressive disorder, single episode, moderate: Secondary | ICD-10-CM | POA: Diagnosis not present

## 2023-12-01 DIAGNOSIS — F411 Generalized anxiety disorder: Secondary | ICD-10-CM | POA: Diagnosis not present

## 2023-12-01 DIAGNOSIS — Z79899 Other long term (current) drug therapy: Secondary | ICD-10-CM | POA: Diagnosis not present

## 2023-12-01 NOTE — ED Provider Notes (Signed)
 Behavioral Health Urgent Care Medical Screening Exam  Patient Name: Chase [name] MRN: [id number] Date of Evaluation: 12/01/23 Chief Complaint:  worsening depressive symptoms Diagnosis:  Final diagnoses:  Current moderate episode of major depressive disorder without prior episode (HCC)   History of Present illness: Chase [name] is a 73 y.o. male with a prior mental health diagnosis of GAD who presents to the [facility] accompanied by his daughter with complaints of worsening depressive symptoms in the context of his declining physical health.  Assessment: During encounter with patient, he is visibly feeling, appears  weak and feeble physically, he presents with an unsteady gait, and ambulating with a cane. Pt is accompanied by his daughter, reports states that her name is Paloma Jacqueline.  Daughter is allowed to stay with her son and assessment area with patient's verbal consent.  Patient reports that he was told yesterday to present here for an assessment due to a house visit that he had yesterday.  Patient reports that he has had 5 surgeries spanning the course of 21 years, and has not been able to move well, and at times, he gets desperate, gets angry, noise bothers him at the times, especially noise coming from children at times.  He states that at times, he feels sad, but that his sadness is related to the physical pain that he is in.  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  He reports that he also gets frequent headaches, but ibuprofen is helpful.  He shares that he gets good days and also some bad days, and reports some depressive symptoms including poor sleep, but shares that sleep has been mostly poor over the past 2 weeks.  He reports difficulty enjoying things that make him happy over the past 2 weeks as well, but adds that it is mostly related to his physical health,  rendering him difficulty, and making it difficult for him to do simple tasks such as grabbing items, and that it is difficult for him to grasp things, and that it frustrates him a lot when he drops them.  Patient reports that arthritis also renders it difficult for him to ambulate, which in turn also frustrates him.  Patient reports that when he is in [place], he is happy, but whenever he comes back  to the [place] , he gets happy for the first 2 weeks, then he progressively becomes sad again. Pt reports worsening depressive symptoms including poor sleep, poor concentration, difficulty enjoying things which render him feeling happy, reports a decreased energy level, decreased appetite, mental clouding, but repeatedly circles back to blaming his symptoms on his physical health.  Recommendations Mental Health Services outpatient for treatment and stabilization of symptoms. We recommended medication management and therapy services to which patient declined. He was not receptive to an appt being made for him and daughter was educated to bring him back should he change his mind and want the services. He currently denies SI/HI/AVH. Denies paranoia and denies delusional thinking.  Flowsheet Row ED from 12/01/2023 in [facility] ED from 11/30/2023 in [facility] Emergency Department at [facility] ED from 11/18/2022 in [facility] Emergency Department at [facility]  C-SSRS RISK CATEGORY Low Risk Low Risk No Risk    Psychiatric Specialty Exam  Presentation  General Appearance:Casual  Eye Contact:Fair  Speech:Clear and Coherent  Speech Volume:Decreased  Handedness:Right   Mood and  Affect  Mood:Depressed; Anxious  Affect:Congruent   Thought Process  Thought Processes:Coherent  Descriptions of Associations:Intact  Orientation:Full (Time, Place and Person)  Thought Content:Logical; WDL    Hallucinations:No data recorded Ideas of  Reference:None  Suicidal Thoughts:No  Homicidal Thoughts:No   Sensorium  Memory:Recent Fair  Judgment:Fair  Insight:Fair   Executive Functions  Concentration:Fair  Attention Span:Fair  Recall:Fair  Fund of Knowledge:Fair  Language:Fair   Psychomotor Activity  Psychomotor Activity:Normal   Assets  Assets:Resilience   Sleep  Sleep:Poor  Number of hours: No data recorded  Physical Exam: Physical Exam Vitals and nursing note reviewed.   Musculoskeletal:        General: No swelling.   Skin:    General: Skin is warm.   Neurological:     General: No focal deficit present.     Mental Status: He is oriented to person, place, and time.   Psychiatric:        Mood and Affect: Mood normal.        Behavior: Behavior normal.        Thought Content: Thought content normal.        Judgment: Judgment normal.    Review of Systems  Psychiatric/Behavioral:  Positive for depression. Negative for hallucinations, memory loss, substance abuse and suicidal ideas. The patient is nervous/anxious and has insomnia.   All other systems reviewed and are negative.  Blood pressure 110/76, pulse 75, temperature 98.2 F (36.8 C), temperature source Oral, resp. rate 16, SpO2 99%. There is no height or weight on file to calculate BMI.  Musculoskeletal: Strength & Muscle Tone: decreased Gait & Station: unsteady Patient leans: N/A  [facility] MSE Discharge Disposition for Follow up and Recommendations: Based on my evaluation the patient does not appear to have an emergency medical condition and can be discharged with resources and follow up care in outpatient services for Medication Management and Individual Therapy-Declined both services Follow up with [facility] Residents Only  Walk-in hours for open access (medication management and therapy) are Monday - Friday 8 am to 11 am. Appointments are limited, so please arrive at 7:00 am. Upon  arrival, please complete the form on the clipboard located at the front desk. If there are no clipboards available, all appointments have been filled for that day.  [facility] [address] [place]  [address] [phone]   Get help right away if: You have thoughts about hurting yourself or others. Get help right away if you feel like you may hurt yourself or others, or have thoughts about taking your own life. Go to your nearest emergency room or: Call 911. Call the National Suicide Prevention Lifeline at (281)848-2107 or 988 in the [place] This is open 24 hours a day. If you're a Veteran: Call 988 and press 1. This is open 24 hours a day. Text the [employer] at 667-656-6181. Summary Mental health is not just the absence of mental illness. It involves understanding your emotions and behaviors, and taking steps to manage them in a healthy way. If you have symptoms of mental or emotional distress, get help from family, friends, a health care provider, or a mental health professional. Practice good mental health behaviors such as stress management skills, self-calming skills, exercise, healthy sleeping and eating, and supportive relationships. This information is not intended to replace advice given to you by your health care provider. Make sure you discuss any questions you have with your health care  provider.   Education provided on the fact that if experiencing worsening of psychiatry symptoms including suicidal ideations, homicidal ideations, or having auditory/visual hallucinations, etc, to call 911, 988, come back to this location, or go to the nearest ER. Pt verbalized understanding.    Donia Snell, NP 12/01/2023, 7:56 PM

## 2023-12-01 NOTE — Progress Notes (Signed)
   12/01/23 1101  [facility] Triage Screening (Walk-ins at [facility] only)  How Did You Hear About Us ? Hospital Discharge  What Is the Reason for Your Visit/Call Today? Chase [name] presents to [facility] voluntarily accompanied by his family. Pt states that he had a house visit on yesterday and he told them he didn't know if it was stress or depression. Pt states that he did tell the providers that he would be better off dead. Pt states that he was sent to [facility] and shared that when he goes to [place] he is fine but when he gets back things are worse. Pt shares that he gets angry because he is unable to do anything. Pt currently denies SI, HI, AVH and alcohol/drug use. Pt states that he will take medication if he needs to do so. (Spanish Interpretors: Millard #[id number] & Emil [phone])  How Long Has This Been Causing You Problems? > than 6 months  Have You Recently Had Any Thoughts About Hurting Yourself? No  Are You Planning to Commit Suicide/Harm Yourself At This time? No  Have you Recently Had Thoughts About Hurting Someone Sherral? No  Are You Planning To Harm Someone At This Time? No  Physical Abuse Denies  Verbal Abuse Denies  Sexual Abuse Denies  Exploitation of patient/patient's resources Denies  Self-Neglect Yes, present (Comment)  Are you currently experiencing any auditory, visual or other hallucinations? No  Have You Used Any Alcohol or Drugs in the Past 24 Hours? No  Do you have any current medical co-morbidities that require immediate attention? No  Clinician description of patient physical appearance/behavior: calm, cooperative  What Do You Feel Would Help You the Most Today? Treatment for Depression or other mood problem  If access to [facility] was not available, would you have sought care in the Emergency Department? Yes  Determination of Need Routine (7 days)  Options For Referral Medication Management;Outpatient Therapy;Geropsychiatric Facility;BH Urgent Care

## 2023-12-01 NOTE — Discharge Instructions (Addendum)
 Education provided to patient and daughter via Spanish interpreter services on the need to: Get help right away if: You have thoughts about hurting yourself or others. Get help right away if you feel like you may hurt yourself or others, or have thoughts about taking your own life. Go to your nearest emergency room or: Call 911. Call the National Suicide Prevention Lifeline at (714)466-6446 or 988 in the [place] This is open 24 hours a day. If you're a Veteran: Call 988 and press 1. This is open 24 hours a day. Text the [employer] at 6038604329. Summary Mental health is not just the absence of mental illness. It involves understanding your emotions and behaviors, and taking steps to manage them in a healthy way. If you have symptoms of mental or emotional distress, get help from family, friends, a health care provider, or a mental health professional. Practice good mental health behaviors such as stress management skills, self-calming skills, exercise, healthy sleeping and eating, and supportive relationships. This information is not intended to replace advice given to you by your health care provider. Make sure you discuss any questions you have with your health care provider. Education provided on the fact that if experiencing worsening of psychiatry symptoms including suicidal ideations, homicidal ideations, or having auditory/visual hallucinations, etc, to call 911, 988, come back to this location, or go to the nearest ER. Pt verbalized understanding.

## 2023-12-01 NOTE — ED Notes (Signed)
 Patient discharged by provider.

## 2023-12-10 ENCOUNTER — Ambulatory Visit: Payer: Medicare HMO

## 2023-12-10 VITALS — BP 123/75 | HR 68 | Ht 62.0 in | Wt 137.0 lb

## 2023-12-10 DIAGNOSIS — Z0000 Encounter for general adult medical examination without abnormal findings: Secondary | ICD-10-CM | POA: Diagnosis not present

## 2023-12-10 NOTE — Progress Notes (Signed)
 Subjective:   Chase [name] is a 73 y.o. who presents for a Medicare Wellness preventive visit.  As a reminder, Annual Wellness Visits don't include a physical exam, and some assessments may be limited, especially if this visit is performed virtually. We may recommend an in-person follow-up visit with your provider if needed.  Visit Complete: Virtual I connected with  Chase [name] on 12/10/23 by a audio enabled telemedicine application and verified that I am speaking with the correct person using two identifiers.  Patient Location: Home  Provider Location: Home Office  I discussed the limitations of evaluation and management by telemedicine. The patient expressed understanding and agreed to proceed.  Vital Signs: Because this visit was a virtual/telehealth visit, some criteria may be missing or patient reported. Any vitals not documented were not able to be obtained and vitals that have been documented are patient reported.  VideoDeclined- This patient declined Librarian, academic. Therefore the visit was completed with audio only.  Persons Participating in Visit: Patient assisted by Chase [name], Chase [name] and Chase [name].  AWV Questionnaire: No: Patient Medicare AWV questionnaire was not completed prior to this visit.  Cardiac Risk Factors include: advanced age (>81men, >90 women);diabetes mellitus;dyslipidemia;hypertension;male gender     Objective:    Today's Vitals   12/10/23 1127  BP: 123/75  Pulse: 68  Weight: 137 lb (62.1 kg)  Height: 5' 2 (1.575 m)   Body mass index is 25.06 kg/m.     12/10/2023   11:29 AM 11/30/2023    7:27 PM 12/09/2022    3:33 PM 11/18/2022    7:34 PM 11/17/2022    3:52 AM 12/04/2021    8:52 AM 01/28/2021    8:40 AM  Advanced Directives  Does Patient Have a Medical Advance Directive? No No No No No No Yes  Would patient like information on creating a medical advance directive?  No -  Patient declined Yes (MAU/Ambulatory/Procedural Areas - Information given)   No - Patient declined     Current Medications (verified) Outpatient Encounter Medications as of 12/10/2023  Medication Sig   atorvastatin  (LIPITOR) 40 MG tablet Take 1 tablet (40 mg total) by mouth every evening.   empagliflozin  (JARDIANCE ) 25 MG TABS tablet Take 1 tablet (25 mg total) by mouth daily before breakfast.   lisinopril  (ZESTRIL ) 5 MG tablet Take 1 tablet (5 mg total) by mouth daily.   metFORMIN  (GLUCOPHAGE ) 500 MG tablet Take 2 tablets (1,000 mg total) by mouth 2 (two) times daily with a meal.   tamsulosin  (FLOMAX ) 0.4 MG CAPS capsule Take 0.4 mg by mouth daily.   blood glucose meter kit and supplies KIT Dispense based on patient and insurance preference. Use up to four times daily as directed. (Patient not taking: Reported on 12/10/2023)   Blood Glucose Monitoring Suppl (ONETOUCH VERIO) w/Device KIT Take 1 each by mouth in the morning and at bedtime. Use to test blood sugar daily (Patient not taking: Reported on 12/10/2023)   glucose blood test strip Use as instructed to test blood sugar once daily.  Please fill One touch verio test strips. DX:E11.69 (Patient not taking: Reported on 12/10/2023)   OneTouch Delica Lancets 33G MISC Use to check blood sugar 2 times daily. E11.69 (Patient not taking: Reported on 12/10/2023)   pantoprazole  (PROTONIX ) 40 MG tablet Take 1 tablet (40 mg total) by mouth daily before breakfast. (Patient not taking: Reported on 12/10/2023)   No facility-administered encounter medications on file as of 12/10/2023.  Allergies (verified) Patient has no known allergies.   History: Past Medical History:  Diagnosis Date   Arthritis    Cancer (HCC)    prostate 2017   Depression    Diabetes mellitus without complication (HCC)    Headache    High cholesterol    Hypertension    Inguinal hernia    rt   Neck pain    Numbness and tingling in hands    Urinary frequency    Past Surgical  History:  Procedure Laterality Date   ANTERIOR CERVICAL DECOMP/DISCECTOMY FUSION N/A 04/04/2015   Procedure: ANTERIOR CERVICAL DECOMPRESSION/DISCECTOMY FUSION PLATING BONEGRAFT CERVICAL FOUR-FIVE CERVICAL FIVE-SIX;  Surgeon: Rockey Peru, MD;  Location: [facility];  Service: Neurosurgery;  Laterality: N/A;   BACK SURGERY     lower back with Dr. Peru   COLONOSCOPY WITH PROPOFOL  N/A 03/22/2019   Procedure: COLONOSCOPY WITH PROPOFOL ;  Surgeon: Harvey Margo CROME, MD;  Location: [facility];  Service: Endoscopy;  Laterality: N/A;  8:30am   INGUINAL HERNIA REPAIR Right 12/22/2018   Procedure: HERNIA REPAIR INGUINAL ADULT WITH MESH;  Surgeon: Mavis Anes, MD;  Location: [facility] ORS;  Service: General;  Laterality: Right;   POLYPECTOMY  03/22/2019   Procedure: POLYPECTOMY;  Surgeon: Harvey Margo CROME, MD;  Location: [facility];  Service: Endoscopy;;  Cold snare polypectomy cecal, hepatic flexure polyps x 2,descending colon  polyps x 3,  and rectal polyps x 3    SHOULDER ARTHROSCOPY Right    UPPER EXTREMITY VENOGRAPHY N/A 07/22/2016   Procedure: Upper Extremity Venography - bilateral arm;  Surgeon: Gaile LELON New, MD;  Location: [facility];  Service: Cardiovascular;  Laterality: N/A;   Family History  Problem Relation Age of Onset   Colon cancer Neg Hx    Colon polyps Neg Hx    Social History   Socioeconomic History   Marital status: Married    Spouse name: Not on file   Number of children: 11   Years of education: Not on file   Highest education level: Not on file  Occupational History   Not on file  Tobacco Use   Smoking status: Former    Current packs/day: 0.00    Average packs/day: 0.3 packs/day for 40.0 years (10.0 ttl pk-yrs)    Types: Cigarettes    Start date: 12/19/1977    Quit date: 12/19/2017    Years since quitting: 5.9   Smokeless tobacco: Never   Tobacco comments:    quit 11/2017 (as of 12/08/2018)  Vaping Use   Vaping status: Never Used  Substance and Sexual  Activity   Alcohol use: Not Currently    Comment: no ETOH since since 2016 (as of 12/08/2018)   Drug use: No   Sexual activity: Not Currently  Other Topics Concern   Not on file  Social History Narrative   Right handed   livs with wife in a one story   Social Drivers of Health   Financial Resource Strain: Low Risk  (12/10/2023)   Overall Financial Resource Strain (CARDIA)    Difficulty of Paying Living Expenses: Not hard at all  Food Insecurity: No Food Insecurity (12/10/2023)   Hunger Vital Sign    Worried About Running Out of Food in the Last Year: Never true    Ran Out of Food in the Last Year: Never true  Transportation Needs: No Transportation Needs (12/10/2023)   PRAPARE - Transportation    Lack of Transportation (Medical): No    Lack of  Transportation (Non-Medical): No  Physical Activity: Insufficiently Active (12/10/2023)   Exercise Vital Sign    Days of Exercise per Week: 3 days    Minutes of Exercise per Session: 10 min  Stress: No Stress Concern Present (12/10/2023)   [employer] of Occupational Health - Occupational Stress Questionnaire    Feeling of Stress: Only a little  Social Connections: Moderately Integrated (12/10/2023)   Social Connection and Isolation Panel    Frequency of Communication with Friends and Family: More than three times a week    Frequency of Social Gatherings with Friends and Family: More than three times a week    Attends Religious Services: Never    Database administrator or Organizations: No    Attends Engineer, structural: More than 4 times per year    Marital Status: Married    Tobacco Counseling Counseling given: Yes Tobacco comments: quit 11/2017 (as of 12/08/2018)    Clinical Intake:  Pre-visit preparation completed: Yes  Pain : No/denies pain     BMI - recorded: 25.06 Nutritional Status: BMI 25 -29 Overweight Nutritional Risks: None Diabetes: Yes CBG done?: No  Lab Results  Component Value Date   HGBA1C 6.7  (H) 11/16/2023   HGBA1C 6.3 (H) 08/14/2023   HGBA1C 7.4 (H) 05/15/2023     How often do you need to have someone help you when you read instructions, pamphlets, or other written materials from your doctor or pharmacy?: 1 - Never  Interpreter Needed?: No  Information entered by :: Chase [name]   Activities of Daily Living     12/10/2023   11:28 AM  In your present state of health, do you have any difficulty performing the following activities:  Hearing? 0  Vision? 0  Difficulty concentrating or making decisions? 1  Walking or climbing stairs? 1  Dressing or bathing? 0  Doing errands, shopping? 0  Preparing Food and eating ? N  Using the Toilet? N  In the past six months, have you accidently leaked urine? N  Do you have problems with loss of bowel control? Y  Managing your Medications? N  Managing your Finances? N  Housekeeping or managing your Housekeeping? Y  Comment pt's wife helps    Patient Care Team: Dettinger, Fonda LABOR, MD as PCP - General (Family Medicine) Gillie Duncans, MD as Consulting Physician (Neurosurgery) Harvey Margo CROME, MD (Inactive) as Consulting Physician (Gastroenterology) Grayce Buddle, RN as Oncology Nurse Navigator Skeet Juliene SAUNDERS, DO as Consulting Physician (Neurology) Cam Morene ORN, MD as Attending Physician (Urology) Patrcia Cough, MD as Consulting Physician (Radiation Oncology)  I have updated your Care Teams any recent Medical Services you may have received from other providers in the past year.     Assessment:   This is a routine wellness examination for Chase [name].  Hearing/Vision screen Hearing Screening - Comments:: Pt denies hearing dif Vision Screening - Comments:: Pt denies vision dif/pt goes [facility] in [place]/last ov last yr   Goals Addressed             This Visit's Progress    Exercise 3x per week (30 min per time)   On track    Patient Stated   On track    12/04/2021 - No more [name], try to exercise and strengthen legs        Depression Screen     12/10/2023   11:54 AM 11/16/2023   11:47 AM 08/14/2023   11:00 AM 06/08/2023    2:04 PM 05/15/2023  10:23 AM 02/13/2023    1:37 PM 12/09/2022    3:32 PM  PHQ 2/9 Scores  PHQ - 2 Score 2 4 5 6 2 5  0  PHQ- 9 Score 8 18 18 15 14 19  0    Fall Risk     12/10/2023   11:36 AM 11/16/2023   11:46 AM 08/14/2023   10:59 AM 06/08/2023    2:04 PM 05/15/2023   10:23 AM  Fall Risk   [name] in the past year? 1 1 1  0 0  Number [name] in past yr: 1 1 1   0  Injury with Fall? 0 0 1  0  Risk for fall due to : Impaired balance/gait;Impaired mobility  Impaired balance/gait  No Fall Risks  Follow up Education provided;[name] evaluation completed;[name] prevention discussed  [name] evaluation completed [name] evaluation completed [name] evaluation completed;Education provided    MEDICARE RISK AT HOME:  Medicare Risk at Home Any stairs in or around the home?: Yes If so, are there any without handrails?: Yes Home free of loose throw rugs in walkways, pet beds, electrical cords, etc?: Yes Adequate lighting in your home to reduce risk of [name]?: Yes Life alert?: No Use of a cane, walker or w/c?: Yes (cane) Grab bars in the bathroom?: No Shower chair or bench in shower?: Yes Elevated toilet seat or a handicapped toilet?: Yes  TIMED UP AND GO:  Was the test performed?  no  Cognitive Function: 6CIT completed    03/29/2018    2:36 PM  MMSE - Mini Mental State Exam  Orientation to time 5  Orientation to Place 5  Registration 3  Attention/ Calculation 5  Recall 3  Language- name 2 objects 2  Language- repeat 1  Language- follow 3 step command 3  Language- read & follow direction 1  Write a sentence 1  Copy design 1  Total score 30        12/10/2023   12:14 PM 12/09/2022    3:39 PM 12/09/2022    3:34 PM 12/04/2021    8:56 AM  6CIT Screen  What Year? 0 points 0 points 0 points 0 points  What month? 0 points 0 points 0 points 0 points  What time? 0 points 0 points 0 points 0  points  Count back from 20 0 points 0 points 0 points 0 points  Months in reverse 0 points 0 points 0 points 0 points  Repeat phrase 10 points 0 points 0 points 10 points  Total Score 10 points 0 points 0 points 10 points    Immunizations Immunization History  Administered Date(s) Administered   Fluad Quad(high Dose 65+) 04/30/2020, 02/19/2022   Fluad Trivalent(High Dose 65+) 02/13/2023   Influenza-Unspecified 06/02/2018, 02/04/2021   Moderna Sars-Covid-2 Vaccination 07/27/2019, 08/24/2019   PFIZER(Purple Top)SARS-COV-2 Vaccination 05/01/2020   Pneumococcal Conjugate-13 03/29/2018   Pneumococcal Polysaccharide-23 10/25/2019   Tdap 03/29/2018   Zoster Recombinant(Shingrix) 11/08/2020, 08/16/2021    Screening Tests Health Maintenance  Topic Date Due   Hepatitis C Screening  Never done   Colonoscopy  05/14/2024 (Originally 03/21/2022)   COVID-19 Vaccine (4 - 2024-25 season) 12/25/2024 (Originally 02/01/2023)   OPHTHALMOLOGY EXAM  12/30/2023   INFLUENZA VACCINE  01/01/2024   FOOT EXAM  02/13/2024   Diabetic kidney evaluation - Urine ACR  05/14/2024   HEMOGLOBIN A1C  05/17/2024   Diabetic kidney evaluation - eGFR measurement  11/15/2024   Medicare Annual Wellness (AWV)  12/09/2024   DTaP/Tdap/Td (2 - Td or Tdap) 03/29/2028  Pneumococcal Vaccine: 50+ Years  Completed   Zoster Vaccines- Shingrix  Completed   Hepatitis B Vaccines  Aged Out   HPV VACCINES  Aged Out   Meningococcal B Vaccine  Aged Out    Health Maintenance  Health Maintenance Due  Topic Date Due   Hepatitis C Screening  Never done   Health Maintenance Items Addressed: See Nurse Notes at the end of this note  Additional Screening:  Vision Screening: Recommended annual ophthalmology exams for early detection of glaucoma and other disorders of the eye. Would you like a referral to an eye doctor? No    Dental Screening: Recommended annual dental exams for proper oral hygiene  Community Resource Referral /  Chronic Care Management: CRR required this visit?  No   CCM required this visit?  No   Plan:    I have personally reviewed and noted the following in the patient's chart:   Medical and social history Use of alcohol, tobacco or illicit drugs  Current medications and supplements including opioid prescriptions. Patient is not currently taking opioid prescriptions. Functional ability and status Nutritional status Physical activity Advanced directives List of other physicians Hospitalizations, surgeries, and ER visits in previous 12 months Vitals Screenings to include cognitive, depression, and [name] Referrals and appointments  In addition, I have reviewed and discussed with patient certain preventive protocols, quality metrics, and best practice recommendations. A written personalized care plan for preventive services as well as general preventive health recommendations were provided to patient.   Ozie Ned, CMA   12/10/2023   After Visit Summary: (MyChart) Due to this being a telephonic visit, the after visit summary with patients personalized plan was offered to patient via MyChart   Notes: Nothing significant to report at this time.

## 2023-12-10 NOTE — Patient Instructions (Signed)
 [name] , Thank you for taking time out of your busy schedule to complete your Annual Wellness Visit with me. I enjoyed our conversation and look forward to speaking with you again next year. I, as well as your care team,  appreciate your ongoing commitment to your health goals. Please review the following plan we discussed and let me know if I can assist you in the future. Your Game plan/ To Do List    Follow up Visits: Next Medicare AWV with our clinical staff: 12/14/23 at 12:30p.m.   Have you seen your provider in the last 6 months (3 months if uncontrolled diabetes)? Yes Next Office Visit with your provider: 02/01/24 at 1:10p.m.  Clinician Recommendations:  Aim for 30 minutes of exercise or brisk walking, 6-8 glasses of water, and 5 servings of fruits and vegetables each day.       This is a list of the screening recommended for you and due dates:  Health Maintenance  Topic Date Due   Hepatitis C Screening  Never done   Medicare Annual Wellness Visit  12/09/2023   Colon Cancer Screening  05/14/2024*   COVID-19 Vaccine (4 - 2024-25 season) 12/25/2024*   Eye exam for diabetics  12/30/2023   Flu Shot  01/01/2024   Complete foot exam   02/13/2024   Yearly kidney health urinalysis for diabetes  05/14/2024   Hemoglobin A1C  05/17/2024   Yearly kidney function blood test for diabetes  11/15/2024   DTaP/Tdap/Td vaccine (2 - Td or Tdap) 03/29/2028   Pneumococcal Vaccine for age over 65  Completed   Zoster (Shingles) Vaccine  Completed   Hepatitis B Vaccine  Aged Out   HPV Vaccine  Aged Out   Meningitis B Vaccine  Aged Out  *Topic was postponed. The date shown is not the original due date.    Advanced directives: (Declined) Advance directive discussed with you today. Even though you declined this today, please call our office should you change your mind, and we can give you the proper paperwork for you to fill out. Advance Care Planning is important because it:  [x]  Makes sure  you receive the medical care that is consistent with your values, goals, and preferences  [x]  It provides guidance to your family and loved ones and reduces their decisional burden about whether or not they are making the right decisions based on your wishes.  Follow the link provided in your after visit summary or read over the paperwork we have mailed to you to help you started getting your Advance Directives in place. If you need assistance in completing these, please reach out to us  so that we can help you!  See attachments for Preventive Care and Fall Prevention Tips.

## 2023-12-18 ENCOUNTER — Ambulatory Visit (HOSPITAL_COMMUNITY): Admitting: Certified Registered Nurse Anesthetist

## 2023-12-18 ENCOUNTER — Encounter (HOSPITAL_COMMUNITY): Payer: Self-pay | Admitting: Internal Medicine

## 2023-12-18 ENCOUNTER — Ambulatory Visit (HOSPITAL_COMMUNITY)
Admission: RE | Admit: 2023-12-18 | Discharge: 2023-12-18 | Disposition: A | Discharge status: Home / Self Care | Attending: Internal Medicine | Admitting: Internal Medicine

## 2023-12-18 ENCOUNTER — Other Ambulatory Visit: Payer: Self-pay

## 2023-12-18 ENCOUNTER — Encounter (HOSPITAL_COMMUNITY)
Admission: RE | Disposition: A | Discharge status: Home / Self Care | Payer: Self-pay | Source: Home / Self Care | Attending: Internal Medicine

## 2023-12-18 DIAGNOSIS — E119 Type 2 diabetes mellitus without complications: Secondary | ICD-10-CM | POA: Insufficient documentation

## 2023-12-18 DIAGNOSIS — K219 Gastro-esophageal reflux disease without esophagitis: Secondary | ICD-10-CM

## 2023-12-18 DIAGNOSIS — K2980 Duodenitis without bleeding: Secondary | ICD-10-CM | POA: Insufficient documentation

## 2023-12-18 DIAGNOSIS — Z7984 Long term (current) use of oral hypoglycemic drugs: Secondary | ICD-10-CM | POA: Diagnosis not present

## 2023-12-18 DIAGNOSIS — K529 Noninfective gastroenteritis and colitis, unspecified: Secondary | ICD-10-CM | POA: Diagnosis not present

## 2023-12-18 DIAGNOSIS — I10 Essential (primary) hypertension: Secondary | ICD-10-CM | POA: Diagnosis not present

## 2023-12-18 DIAGNOSIS — R6881 Early satiety: Secondary | ICD-10-CM | POA: Insufficient documentation

## 2023-12-18 DIAGNOSIS — K648 Other hemorrhoids: Secondary | ICD-10-CM

## 2023-12-18 DIAGNOSIS — Z87891 Personal history of nicotine dependence: Secondary | ICD-10-CM | POA: Insufficient documentation

## 2023-12-18 DIAGNOSIS — K2100 Gastro-esophageal reflux disease with esophagitis, without bleeding: Secondary | ICD-10-CM | POA: Insufficient documentation

## 2023-12-18 DIAGNOSIS — K259 Gastric ulcer, unspecified as acute or chronic, without hemorrhage or perforation: Secondary | ICD-10-CM

## 2023-12-18 DIAGNOSIS — R1310 Dysphagia, unspecified: Secondary | ICD-10-CM | POA: Insufficient documentation

## 2023-12-18 DIAGNOSIS — C884 Extranodal marginal zone b-cell lymphoma of mucosa-associated lymphoid tissue (malt-lymphoma) not having achieved remission: Secondary | ICD-10-CM | POA: Diagnosis not present

## 2023-12-18 DIAGNOSIS — E1169 Type 2 diabetes mellitus with other specified complication: Secondary | ICD-10-CM

## 2023-12-18 HISTORY — PX: COLONOSCOPY: SHX5424

## 2023-12-18 HISTORY — PX: ESOPHAGOGASTRODUODENOSCOPY: SHX5428

## 2023-12-18 LAB — GLUCOSE, CAPILLARY
Glucose-Capillary: 71 mg/dL (ref 70–99)
Glucose-Capillary: 72 mg/dL (ref 70–99)

## 2023-12-18 SURGERY — COLONOSCOPY
Anesthesia: General

## 2023-12-18 MED ORDER — LIDOCAINE 2% (20 MG/ML) 5 ML SYRINGE
INTRAMUSCULAR | Status: DC | PRN
  Administered 2023-12-18: 60 mg via INTRAVENOUS

## 2023-12-18 MED ORDER — LACTATED RINGERS IV SOLN
INTRAVENOUS | Status: DC | PRN
Start: 2023-12-18 — End: 2023-12-18

## 2023-12-18 MED ORDER — LACTATED RINGERS IV SOLN: INTRAVENOUS | Status: DC

## 2023-12-18 MED ORDER — PANTOPRAZOLE SODIUM 40 MG PO TBEC: 40.0000 mg | DELAYED_RELEASE_TABLET | Freq: Two times a day (BID) | ORAL | 11 refills | Status: DC

## 2023-12-18 MED ORDER — PHENYLEPHRINE 80 MCG/ML (10ML) SYRINGE FOR IV PUSH (FOR BLOOD PRESSURE SUPPORT)
PREFILLED_SYRINGE | INTRAVENOUS | Status: DC | PRN
  Administered 2023-12-18 (×5): 80 ug via INTRAVENOUS

## 2023-12-18 MED ORDER — EPHEDRINE SULFATE-NACL 50-0.9 MG/10ML-% IV SOSY
PREFILLED_SYRINGE | INTRAVENOUS | Status: DC | PRN
  Administered 2023-12-18 (×3): 5 mg via INTRAVENOUS

## 2023-12-18 MED ORDER — PROPOFOL 500 MG/50ML IV EMUL
INTRAVENOUS | Status: DC | PRN
  Administered 2023-12-18: 80 mg via INTRAVENOUS
  Administered 2023-12-18: 150 ug/kg/min via INTRAVENOUS
  Administered 2023-12-18: 30 mg via INTRAVENOUS
  Administered 2023-12-18: 40 mg via INTRAVENOUS

## 2023-12-18 NOTE — Anesthesia Preprocedure Evaluation (Addendum)
 Anesthesia Evaluation  Patient identified by MRN, date of birth, ID band Patient awake    Reviewed: Allergy & Precautions, H&P , NPO status , Patient's Chart, lab work & pertinent test results, reviewed documented beta blocker date and time   Airway Mallampati: II  TM Distance: >3 FB Neck ROM: full    Dental  (+) Poor Dentition, Missing, Dental Advisory Given, Chipped   Pulmonary former smoker   Pulmonary exam normal breath sounds clear to auscultation       Cardiovascular Exercise Tolerance: Good hypertension, Normal cardiovascular exam Rhythm:regular Rate:Normal     Neuro/Psych  Headaches PSYCHIATRIC DISORDERS  Depression     Neuromuscular disease    GI/Hepatic negative GI ROS, Neg liver ROS,,,  Endo/Other  diabetes, Type 2    Renal/GU negative Renal ROS  negative genitourinary   Musculoskeletal  (+) Arthritis , Osteoarthritis,    Abdominal   Peds  Hematology negative hematology ROS (+)   Anesthesia Other Findings cancer  Reproductive/Obstetrics negative OB ROS                              Anesthesia Physical Anesthesia Plan  ASA: 3  Anesthesia Plan: General   Post-op Pain Management: Minimal or no pain anticipated   Induction: Intravenous  PONV Risk Score and Plan: Propofol  infusion  Airway Management Planned: Natural Airway and Nasal Cannula  Additional Equipment: None  Intra-op Plan:   Post-operative Plan:   Informed Consent: I have reviewed the patients History and Physical, chart, labs and discussed the procedure including the risks, benefits and alternatives for the proposed anesthesia with the patient or authorized representative who has indicated his/her understanding and acceptance.     Dental Advisory Given and Interpreter used for interview  Plan Discussed with: CRNA  Anesthesia Plan Comments:          Anesthesia Quick Evaluation

## 2023-12-18 NOTE — Op Note (Signed)
 [facility] Patient Name: Chase [name] Procedure Date: 12/18/2023 9:18 AM MRN: [id number] Date of Birth: 06/14/1950 Attending MD: Carlin POUR. Chase [name] , [place], [id number] CSN: [id number] Age: 73 Admit Type: Outpatient Procedure:                Upper GI endoscopy Indications:              Heartburn, Early satiety, Nausea Providers:                [name], DO, Jon LABOR. Gerome RN, RN,                            Chase [name] Tech, Technician Referring MD:              Medicines:                See the Anesthesia note for documentation of the                            administered medications Complications:            No immediate complications. Estimated Blood Loss:     Estimated blood loss was minimal. Procedure:                Pre-Anesthesia Assessment:                           - The anesthesia plan was to use monitored                            anesthesia care (MAC).                           After obtaining informed consent, the endoscope was                            passed under direct vision. Throughout the                            procedure, the patient's blood pressure, pulse, and                            oxygen saturations were monitored continuously. The                            [id number]-H190 ([id number]) scope was introduced through the                            mouth, and advanced to the second part of duodenum.                            The upper GI endoscopy was accomplished without                            difficulty. The patient tolerated the procedure  well. Scope In: 9:51:14 AM Scope Out: 9:57:51 AM Total Procedure Duration: 0 hours 6 minutes 37 seconds  Findings:      LA Grade A (one or more mucosal breaks less than 5 mm, not extending       between tops of 2 mucosal folds) esophagitis with no bleeding was found       at the gastroesophageal junction. Biopsies were taken with a cold       forceps for histology.       Three cratered gastric ulcers were found in the gastric body and in the       gastric antrum. The largest lesion was 5 mm in largest dimension.       Biopsies were taken with a cold forceps for histology.      Localized moderate inflammation characterized by erythema and shallow       ulcerations was found in the duodenal bulb. Biopsies were taken with a       cold forceps for histology. Impression:               - LA Grade A reflux esophagitis with no bleeding.                            Biopsied.                           - Gastric ulcers. Biopsied.                           - Duodenitis. Biopsied. Moderate Sedation:      Per Anesthesia Care Recommendation:           - Patient has a contact number available for                            emergencies. The signs and symptoms of potential                            delayed complications were discussed with the                            patient. Return to normal activities tomorrow.                            Written discharge instructions were provided to the                            patient.                           - Resume previous diet.                           - Continue present medications.                           - Await pathology results.                           - Repeat upper endoscopy in 10-12 weeks to  evaluate                            the response to therapy.                           - Use a proton pump inhibitor PO BID.                           - No ibuprofen, naproxen, or other non-steroidal                            anti-inflammatory drugs.                           - Consider fasting gastrin given extensive                            ulcerations. Procedure Code(s):        --- Professional ---                           [phone], Esophagogastroduodenoscopy, flexible,                            transoral; with biopsy, single or multiple Diagnosis Code(s):        --- Professional ---                            K21.00, Gastro-esophageal reflux disease with                            esophagitis, without bleeding                           K25.9, Gastric ulcer, unspecified as acute or                            chronic, without hemorrhage or perforation                           K29.80, Duodenitis without bleeding                           R12, Heartburn                           R68.81, Early satiety                           R11.0, Nausea CPT copyright 2022 American Medical Association. All rights reserved. The codes documented in this report are preliminary and upon coder review may  be revised to meet current compliance requirements. [name], DO Carlin POUR. Chase Sakamoto, DO 12/18/2023 10:01:56 AM This report has been signed electronically. Number of Addenda: 0

## 2023-12-18 NOTE — Discharge Instructions (Addendum)
 Your upper endoscopy revealed evidence of acid reflux in your esophagus.  I took samples of this.  You had 3 ulcers in your stomach which I also biopsied.  You also had multiple ulcerations in your small bowel as well which I sampled.  We will call with these results.  I am going to increase your pantoprazole  to twice daily.  Avoid all NSAIDs.  Your colonoscopy was relatively unremarkable.  I did not find any polyps or evidence of colon cancer.  I recommend repeating colonoscopy in 10 years for colon cancer screening purposes.   Overall, your colon appeared very healthy.  I did not see any active inflammation indicative of underlying inflammatory bowel disease such as Crohn's disease or ulcerative colitis throughout your colon or end portion of your small bowel.  I took biopsies of your colon to further evaluate.  Await pathology results, my office will contact you.  Follow-up in GI office in 6 to 8 weeks.  Dr. Cindie

## 2023-12-18 NOTE — Anesthesia Procedure Notes (Signed)
 Date/Time: 12/18/2023 9:42 AM  Performed by: Barbarann Verneita RAMAN, CRNAPre-anesthesia Checklist: Patient identified, Emergency Drugs available, Suction available, Timeout performed and Patient being monitored Patient Re-evaluated:Patient Re-evaluated prior to induction Oxygen Delivery Method: Nasal Cannula

## 2023-12-18 NOTE — Op Note (Signed)
 [facility] Patient Name: Chase [name] Procedure Date: 12/18/2023 9:23 AM MRN: [id number] Date of Birth: August 14, 1950 Attending MD: [name] , [place], [id number] CSN: [id number] Age: 73 Admit Type: Outpatient Procedure:                Colonoscopy Indications:              Abdominal pain in the right lower quadrant, Chronic                            diarrhea Providers:                [name], DO, Jon LABOR. Gerome RN, RN,                            Gordy Bruckner Tech, Technician Referring MD:              Medicines:                See the Anesthesia note for documentation of the                            administered medications Complications:            No immediate complications. Estimated Blood Loss:     Estimated blood loss was minimal. Procedure:                Pre-Anesthesia Assessment:                           - The anesthesia plan was to use monitored                            anesthesia care (MAC).                           After obtaining informed consent, the colonoscope                            was passed under direct vision. Throughout the                            procedure, the patient's blood pressure, pulse, and                            oxygen saturations were monitored continuously. The                            [id number]-HQ190L ([id number]) scope was introduced through                            the anus and advanced to the the terminal ileum,                            with identification of the appendiceal orifice and                            IC valve. The  colonoscopy was performed without                            difficulty. The patient tolerated the procedure                            well. The quality of the bowel preparation was                            evaluated using the BBPS [facility] Bowel Preparation                            Scale) with scores of: Right Colon = 3, Transverse                            Colon = 3 and Left  Colon = 3 (entire mucosa seen                            well with no residual staining, small fragments of                            stool or opaque liquid). The total BBPS score                            equals 9. Scope In: 10:02:59 AM Scope Out: 10:20:00 AM Scope Withdrawal Time: 0 hours 13 minutes 48 seconds  Total Procedure Duration: 0 hours 17 minutes 1 second  Findings:      Non-bleeding internal hemorrhoids were found.      The terminal ileum appeared normal.      Biopsies for histology were taken with a cold forceps from the ascending       colon, transverse colon and descending colon for evaluation of       microscopic colitis.      The exam was otherwise without abnormality. Impression:               - Non-bleeding internal hemorrhoids.                           - The examined portion of the ileum was normal.                           - The examination was otherwise normal.                           - Biopsies were taken with a cold forceps from the                            ascending colon, transverse colon and descending                            colon for evaluation of microscopic colitis. Moderate Sedation:      Per Anesthesia Care Recommendation:           - Patient has a contact number available for  emergencies. The signs and symptoms of potential                            delayed complications were discussed with the                            patient. Return to normal activities tomorrow.                            Written discharge instructions were provided to the                            patient.                           - Resume previous diet.                           - Continue present medications.                           - Await pathology results.                           - Repeat colonoscopy in 5 years for surveillance.                           - Return to GI clinic in 8 weeks. Procedure Code(s):        ---  Professional ---                           [phone], Colonoscopy, flexible; with biopsy, single                            or multiple Diagnosis Code(s):        --- Professional ---                           K64.8, Other hemorrhoids                           R10.31, Right lower quadrant pain                           K52.9, Noninfective gastroenteritis and colitis,                            unspecified CPT copyright 2022 American Medical Association. All rights reserved. The codes documented in this report are preliminary and upon coder review may  be revised to meet current compliance requirements. [name], DO [name], DO 12/18/2023 10:23:58 AM This report has been signed electronically. Number of Addenda: 0

## 2023-12-18 NOTE — Transfer of Care (Signed)
 Immediate Anesthesia Transfer of Care Note  Patient: Chase [name]  Procedure(s) Performed: COLONOSCOPY EGD (ESOPHAGOGASTRODUODENOSCOPY)  Patient Location: Endoscopy Unit  Anesthesia Type:General  Level of Consciousness: awake and alert   Airway & Oxygen Therapy: Patient Spontanous Breathing  Post-op Assessment: Report given to RN and Post -op Vital signs reviewed and stable  Post vital signs: Reviewed and stable  Last Vitals:  Vitals Value Taken Time  BP 102/40 12/18/23 10:24  Temp 36.6 C 12/18/23 10:24  Pulse 79 12/18/23 10:24  Resp 20 12/18/23 10:24  SpO2 97 % 12/18/23 10:24    Last Pain:  Vitals:   12/18/23 1024  TempSrc: Axillary  PainSc: 0-No pain      Patients Stated Pain Goal: 8 (12/18/23 0919)  Complications: No notable events documented.

## 2023-12-18 NOTE — Anesthesia Postprocedure Evaluation (Signed)
 Anesthesia Post Note  Patient: Chase [name]  Procedure(s) Performed: COLONOSCOPY EGD (ESOPHAGOGASTRODUODENOSCOPY)  Patient location during evaluation: Endoscopy Anesthesia Type: General Level of consciousness: awake and alert Pain management: pain level controlled Vital Signs Assessment: post-procedure vital signs reviewed and stable Respiratory status: spontaneous breathing, nonlabored ventilation and respiratory function stable Cardiovascular status: blood pressure returned to baseline and stable Postop Assessment: no apparent nausea or vomiting Anesthetic complications: no   There were no known notable events for this encounter.   Last Vitals:  Vitals:   12/18/23 1024 12/18/23 1027  BP: (!) 102/40 (!) 104/48  Pulse: 79 76  Resp: 20 (!) 24  Temp: 36.6 C   SpO2: 97% 97%    Last Pain:  Vitals:   12/18/23 1024  TempSrc: Axillary  PainSc: 0-No pain                 Paulette Rockford L Tarnesha Ulloa

## 2023-12-18 NOTE — H&P (Signed)
 Primary Care Physician:  Dettinger, Fonda LABOR, MD Primary Gastroenterologist:  Dr. Cindie  Pre-Procedure History & Physical: HPI:  Chase [name] is a 73 y.o. male is here  for an EGD to be performed for nausea, early satiety and colonoscopy for right lower quadrant pain, diarrhea.  Past Medical History:  Diagnosis Date   Arthritis    Cancer (HCC)    prostate 2017   Depression    Diabetes mellitus without complication (HCC)    Headache    High cholesterol    Hypertension    Inguinal hernia    rt   Neck pain    Numbness and tingling in hands    Urinary frequency     Past Surgical History:  Procedure Laterality Date   ANTERIOR CERVICAL DECOMP/DISCECTOMY FUSION N/A 04/04/2015   Procedure: ANTERIOR CERVICAL DECOMPRESSION/DISCECTOMY FUSION PLATING BONEGRAFT CERVICAL FOUR-FIVE CERVICAL FIVE-SIX;  Surgeon: Rockey Peru, MD;  Location: [facility];  Service: Neurosurgery;  Laterality: N/A;   BACK SURGERY     lower back with Dr. Peru   COLONOSCOPY WITH PROPOFOL  N/A 03/22/2019   Procedure: COLONOSCOPY WITH PROPOFOL ;  Surgeon: Harvey Margo CROME, MD;  Location: [facility];  Service: Endoscopy;  Laterality: N/A;  8:30am   INGUINAL HERNIA REPAIR Right 12/22/2018   Procedure: HERNIA REPAIR INGUINAL ADULT WITH MESH;  Surgeon: Mavis Anes, MD;  Location: [facility] ORS;  Service: General;  Laterality: Right;   POLYPECTOMY  03/22/2019   Procedure: POLYPECTOMY;  Surgeon: Harvey Margo CROME, MD;  Location: [facility];  Service: Endoscopy;;  Cold snare polypectomy cecal, hepatic flexure polyps x 2,descending colon  polyps x 3,  and rectal polyps x 3    SHOULDER ARTHROSCOPY Right    UPPER EXTREMITY VENOGRAPHY N/A 07/22/2016   Procedure: Upper Extremity Venography - bilateral arm;  Surgeon: Gaile LELON New, MD;  Location: [facility] INVASIVE CV LAB;  Service: Cardiovascular;  Laterality: N/A;    Prior to Admission medications   Medication Sig Start Date End Date Taking? Authorizing Provider  atorvastatin   (LIPITOR) 40 MG tablet Take 1 tablet (40 mg total) by mouth every evening. 03/10/23  Yes Dettinger, Fonda LABOR, MD  blood glucose meter kit and supplies KIT Dispense based on patient and insurance preference. Use up to four times daily as directed. 02/01/21  Yes Dettinger, Fonda LABOR, MD  Blood Glucose Monitoring Suppl (ONETOUCH VERIO) w/Device KIT Take 1 each by mouth in the morning and at bedtime. Use to test blood sugar daily 08/01/20  Yes Dettinger, Fonda LABOR, MD  glucose blood test strip Use as instructed to test blood sugar once daily.  Please fill One touch verio test strips. DX:E11.69 09/26/19  Yes Dettinger, Fonda LABOR, MD  lisinopril  (ZESTRIL ) 5 MG tablet Take 1 tablet (5 mg total) by mouth daily. 02/13/23  Yes Dettinger, Fonda LABOR, MD  metFORMIN  (GLUCOPHAGE ) 500 MG tablet Take 2 tablets (1,000 mg total) by mouth 2 (two) times daily with a meal. 02/13/23  Yes Dettinger, Fonda LABOR, MD  OneTouch Delica Lancets 33G MISC Use to check blood sugar 2 times daily. E11.69 10/17/19  Yes Dettinger, Fonda LABOR, MD  pantoprazole  (PROTONIX ) 40 MG tablet Take 1 tablet (40 mg total) by mouth daily before breakfast. 10/12/23  Yes Rudy Josette RAMAN, PA-C  tamsulosin  (FLOMAX ) 0.4 MG CAPS capsule Take 0.4 mg by mouth daily.   Yes [provider]  empagliflozin  (JARDIANCE ) 25 MG TABS tablet Take 1 tablet (25 mg total) by mouth daily before breakfast. 02/13/23   Dettinger,  Fonda LABOR, MD    Allergies as of 11/12/2023   (No Known Allergies)    Family History  Problem Relation Age of Onset   Colon cancer Neg Hx    Colon polyps Neg Hx     Social History   Socioeconomic History   Marital status: Married    Spouse name: Not on file   Number of children: 11   Years of education: Not on file   Highest education level: Not on file  Occupational History   Not on file  Tobacco Use   Smoking status: Former    Current packs/day: 0.00    Average packs/day: 0.3 packs/day for 40.0 years (10.0 ttl pk-yrs)    Types:  Cigarettes    Start date: 12/19/1977    Quit date: 12/19/2017    Years since quitting: 6.0   Smokeless tobacco: Never   Tobacco comments:    quit 11/2017 (as of 12/08/2018)  Vaping Use   Vaping status: Never Used  Substance and Sexual Activity   Alcohol use: Not Currently    Comment: no ETOH since since 2016 (as of 12/08/2018)   Drug use: No   Sexual activity: Not Currently  Other Topics Concern   Not on file  Social History Narrative   Right handed   livs with wife in a one story   Social Drivers of Health   Financial Resource Strain: Low Risk  (12/10/2023)   Overall Financial Resource Strain (CARDIA)    Difficulty of Paying Living Expenses: Not hard at all  Food Insecurity: No Food Insecurity (12/10/2023)   Hunger Vital Sign    Worried About Running Out of Food in the Last Year: Never true    Ran Out of Food in the Last Year: Never true  Transportation Needs: No Transportation Needs (12/10/2023)   PRAPARE - Administrator, Civil Service (Medical): No    Lack of Transportation (Non-Medical): No  Physical Activity: Insufficiently Active (12/10/2023)   Exercise Vital Sign    Days of Exercise per Week: 3 days    Minutes of Exercise per Session: 10 min  Stress: No Stress Concern Present (12/10/2023)   [employer] of Occupational Health - Occupational Stress Questionnaire    Feeling of Stress: Only a little  Social Connections: Moderately Integrated (12/10/2023)   Social Connection and Isolation Panel    Frequency of Communication with Friends and Family: More than three times a week    Frequency of Social Gatherings with Friends and Family: More than three times a week    Attends Religious Services: Never    Database administrator or Organizations: No    Attends Engineer, structural: More than 4 times per year    Marital Status: Married  Catering manager Violence: Not At Risk (12/10/2023)   Humiliation, Afraid, Rape, and Kick questionnaire    Fear of Current  or Ex-Partner: No    Emotionally Abused: No    Physically Abused: No    Sexually Abused: No    Review of Systems: General: Negative for fever, chills, fatigue, weakness. Eyes: Negative for vision changes.  ENT: Negative for hoarseness, difficulty swallowing , nasal congestion. CV: Negative for chest pain, angina, palpitations, dyspnea on exertion, peripheral edema.  Respiratory: Negative for dyspnea at rest, dyspnea on exertion, cough, sputum, wheezing.  GI: See history of present illness. GU:  Negative for dysuria, hematuria, urinary incontinence, urinary frequency, nocturnal urination.  MS: Negative for joint pain, low back pain.  Derm:  Negative for rash or itching.  Neuro: Negative for weakness, abnormal sensation, seizure, frequent headaches, memory loss, confusion.  Psych: Negative for anxiety, depression Endo: Negative for unusual weight change.  Heme: Negative for bruising or bleeding. Allergy: Negative for rash or hives.  Physical Exam: Vital signs in last 24 hours: Temp:  [98.5 F (36.9 C)] 98.5 F (36.9 C) (07/18 0919) Pulse Rate:  [73] 73 (07/18 0919) BP: (131)/(81) 131/81 (07/18 0919) SpO2:  [98 %] 98 % (07/18 0919)   General:   Alert,  Well-developed, well-nourished, pleasant and cooperative in NAD Head:  Normocephalic and atraumatic. Eyes:  Sclera clear, no icterus.   Conjunctiva pink. Ears:  Normal auditory acuity. Nose:  No deformity, discharge,  or lesions. Msk:  Symmetrical without gross deformities. Normal posture. Extremities:  Without clubbing or edema. Neurologic:  Alert and  oriented x4;  grossly normal neurologically. Skin:  Intact without significant lesions or rashes. Psych:  Alert and cooperative. Normal mood and affect.   Impression/Plan: Bless Belshe is here for an EGD to be performed for nausea, early satiety and colonoscopy for right lower quadrant pain, diarrhea.  Risks, benefits, limitations, imponderables and alternatives regarding  procedure have been reviewed with the patient. Questions have been answered. All parties agreeable.

## 2023-12-21 ENCOUNTER — Encounter (HOSPITAL_COMMUNITY): Payer: Self-pay | Admitting: Internal Medicine

## 2023-12-23 LAB — SURGICAL PATHOLOGY

## 2023-12-31 ENCOUNTER — Encounter (HOSPITAL_COMMUNITY): Payer: Self-pay

## 2023-12-31 ENCOUNTER — Other Ambulatory Visit: Payer: Self-pay | Admitting: *Deleted

## 2023-12-31 ENCOUNTER — Ambulatory Visit: Payer: Self-pay | Admitting: Internal Medicine

## 2023-12-31 ENCOUNTER — Other Ambulatory Visit: Payer: Self-pay | Admitting: Internal Medicine

## 2023-12-31 DIAGNOSIS — C884 Extranodal marginal zone b-cell lymphoma of mucosa-associated lymphoid tissue (malt-lymphoma) not having achieved remission: Secondary | ICD-10-CM

## 2024-01-25 NOTE — Progress Notes (Unsigned)
 Referring Provider: Dettinger, Fonda LABOR, MD Primary Care Physician:  Dettinger, Fonda LABOR, MD Primary GI Physician: Dr. Cindie  No chief complaint on file.   HPI:   Chase [name] is a 73 y.o. male with history of prostate cancer, diabetes, HTN, HLD, arthritis, depression, right inguinal hernia s/p repair in 2020, adenomatous colon polyps, presenting today for follow-up of multiple GI issues including abdominal pain, diarrhea, GERD, nausea, early satiety, dysphagia, weight loss s/p EGD and colonoscopy.  Last seen in the office 10/12/2023.  Reported RUQ abdominal pain for the last year, but worsening recently with fairly significant tenderness in the RLQ region on exam.  Also with new onset diarrhea starting 10/04/2023 while in [place] with multiple BMs per day as well as nocturnal stools.  Previously had been constipated.  Few year history of early satiety, nausea with worsening symptoms recently leading to weight loss.  Also noted moderate tenderness in the epigastric area on exam.  Also with chronic GERD that was uncontrolled as he was not on anything for this and also noted progressive solid food dysphagia over the last few years.  He was scheduled for CT, labs and stool studies ordered, started on pantoprazole  40 mg daily, and plan to arrange EGD and colonoscopy pending imaging and labs.  Labs and stool testing unrevealing.    CT A/P with contrast showed minimal diffuse bladder wall thickening, likely due to chronic bladder outlet obstruction from enlarged prostate gland, minimal hepatic steatosis, small left inguinal hernia containing fat.  Recommend following up with PCP and/or urology on bladder and prostate findings.  Colonoscopy 12/18/2023:  - Non- bleeding internal hemorrhoids.  - The examined portion of the ileum was normal.  - The examination was otherwise normal.  - Biopsies were taken with a cold forceps from the ascending colon, transverse colon and descending colon for  evaluation of microscopic colitis. -Random colon biopsies were benign. - Repeat in 5 years.   EGD 12/18/2023:  - LA Grade A reflux esophagitis with no bleeding. Biopsied. - Gastric ulcers. Biopsied.  - Duodenitis. Biopsied. - Recommended PPI BID and considering fasting gastrin given extensive ulcerations. - Repeat EGD in 10-12 weeks. - Pathology: Gastric biopsy with atypical nodular B-cell infiltrate.  Duodenal mucosa with mild focal erosion and reactive/reparative change.  No H. pylori.  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  CT scheduled for 01/28/2024, and he has his first oncology visit 02/02/2024.  Today:    Past Medical History:  Diagnosis Date   Arthritis    Cancer (HCC)    prostate 2017   Depression    Diabetes mellitus without complication (HCC)    Headache    High cholesterol    Hypertension    Inguinal hernia    rt   Neck pain    Numbness and tingling in hands    Urinary frequency     Past Surgical History:  Procedure Laterality Date   ANTERIOR CERVICAL DECOMP/DISCECTOMY FUSION N/A 04/04/2015   Procedure: ANTERIOR CERVICAL DECOMPRESSION/DISCECTOMY FUSION PLATING BONEGRAFT CERVICAL FOUR-FIVE CERVICAL FIVE-SIX;  Surgeon: Rockey Peru, MD;  Location: [facility];  Service: Neurosurgery;  Laterality: N/A;   BACK SURGERY     lower back with Dr. Peru   COLONOSCOPY N/A 12/18/2023   Procedure: COLONOSCOPY;  Surgeon: Cindie Carlin POUR, DO;  Location: [facility];  Service: Endoscopy;  Laterality: N/A;  10:00 AM, ASA 2   COLONOSCOPY WITH PROPOFOL  N/A 03/22/2019   Procedure: COLONOSCOPY WITH PROPOFOL ;  Surgeon: Harvey Margo CROME, MD;  Location: [facility];  Service: Endoscopy;  Laterality: N/A;  8:30am   ESOPHAGOGASTRODUODENOSCOPY N/A 12/18/2023    Procedure: EGD (ESOPHAGOGASTRODUODENOSCOPY);  Surgeon: Cindie Carlin POUR, DO;  Location: [facility];  Service: Endoscopy;  Laterality: N/A;   INGUINAL HERNIA REPAIR Right 12/22/2018   Procedure: HERNIA REPAIR INGUINAL ADULT WITH MESH;  Surgeon: Mavis Anes, MD;  Location: [facility];  Service: General;  Laterality: Right;   POLYPECTOMY  03/22/2019   Procedure: POLYPECTOMY;  Surgeon: Harvey Margo CROME, MD;  Location: [facility];  Service: Endoscopy;;  Cold snare polypectomy cecal, hepatic flexure polyps x 2,descending colon  polyps x 3,  and rectal polyps x 3    SHOULDER ARTHROSCOPY Right    UPPER EXTREMITY VENOGRAPHY N/A 07/22/2016   Procedure: Upper Extremity Venography - bilateral arm;  Surgeon: Gaile LELON New, MD;  Location: [facility];  Service: Cardiovascular;  Laterality: N/A;    Current Outpatient Medications  Medication Sig Dispense Refill   atorvastatin  (LIPITOR) 40 MG tablet Take 1 tablet (40 mg total) by mouth every evening. 100 tablet 2   blood glucose meter kit and supplies KIT Dispense based on patient and insurance preference. Use up to four times daily as directed. 1 each 0   Blood Glucose Monitoring Suppl (ONETOUCH VERIO) w/Device KIT Take 1 each by mouth in the morning and at bedtime. Use to test blood sugar daily 1 kit 1   empagliflozin  (JARDIANCE ) 25 MG TABS tablet Take 1 tablet (25 mg total) by mouth daily before breakfast. 90 tablet 3   glucose blood test strip Use as instructed to test blood sugar once daily.  Please fill One touch verio test strips. DX:E11.69 100 each 12   lisinopril  (ZESTRIL ) 5 MG tablet Take 1 tablet (5 mg total) by mouth daily. 90 tablet 3   metFORMIN  (GLUCOPHAGE ) 500 MG tablet Take 2 tablets (1,000 mg total) by mouth 2 (two) times daily with a meal. 360 tablet 3   OneTouch Delica Lancets 33G MISC Use to check blood sugar 2 times daily. E11.69 100 each 4   pantoprazole  (PROTONIX ) 40 MG tablet Take 1 tablet (40 mg total) by mouth 2 (two) times  daily. 60 tablet 11   tamsulosin  (FLOMAX ) 0.4 MG CAPS capsule Take 0.4 mg by mouth daily.     No current facility-administered medications for this visit.    Allergies as of 01/27/2024   (No Known Allergies)    Family History  Problem Relation Age of Onset   Colon cancer Neg Hx    Colon polyps Neg Hx     Social History   Socioeconomic History   Marital status: Married    Spouse name: Not on file   Number of children: 11   Years of education: Not on file   Highest education level: Not on file  Occupational History   Not on file  Tobacco Use   Smoking status: Former    Current packs/day: 0.00    Average packs/day: 0.3 packs/day for 40.0 years (10.0 ttl pk-yrs)    Types: Cigarettes    Start date: 12/19/1977    Quit date: 12/19/2017    Years since quitting: 6.1   Smokeless tobacco: Never   Tobacco comments:    quit 11/2017 (as of 12/08/2018)  Vaping Use   Vaping status: Never Used  Substance and Sexual Activity   Alcohol use: Not Currently    Comment: no ETOH since since 2016 (as of 12/08/2018)   Drug use: No   Sexual activity: Not Currently  Other Topics Concern   Not on file  Social History Narrative   Right handed   livs with wife in a one story   Social Drivers of Health   Financial Resource Strain: Low Risk  (12/10/2023)   Overall Financial Resource Strain (CARDIA)    Difficulty of Paying Living Expenses: Not hard at all  Food Insecurity: No Food Insecurity (12/10/2023)   Hunger Vital Sign    Worried About Running Out of Food in the Last Year: Never true    Ran Out of Food in the Last Year: Never true  Transportation Needs: No Transportation Needs (12/10/2023)   PRAPARE - Administrator, Civil Service (Medical): No    Lack of Transportation (Non-Medical): No  Physical Activity: Insufficiently Active (12/10/2023)   Exercise Vital Sign    Days of Exercise per Week: 3 days    Minutes of Exercise per Session: 10 min  Stress: No Stress Concern Present  (12/10/2023)   [employer] of Occupational Health - Occupational Stress Questionnaire    Feeling of Stress: Only a little  Social Connections: Moderately Integrated (12/10/2023)   Social Connection and Isolation Panel    Frequency of Communication with Friends and Family: More than three times a week    Frequency of Social Gatherings with Friends and Family: More than three times a week    Attends Religious Services: Never    Database administrator or Organizations: No    Attends Engineer, structural: More than 4 times per year    Marital Status: Married    Review of Systems: Gen: Denies fever, chills, anorexia. Denies fatigue, weakness, weight loss.  CV: Denies chest pain, palpitations, syncope, peripheral edema, and claudication. Resp: Denies dyspnea at rest, cough, wheezing, coughing up blood, and pleurisy. GI: Denies vomiting blood, jaundice, and fecal incontinence.   Denies dysphagia or odynophagia. Derm: Denies rash, itching, dry skin Psych: Denies depression, anxiety, memory loss, confusion. No homicidal or suicidal ideation.  Heme: Denies bruising, bleeding, and enlarged lymph nodes.  Physical Exam: There were no vitals taken for this visit. General:   Alert and oriented. No distress noted. Pleasant and cooperative.  Head:  Normocephalic and atraumatic. Eyes:  Conjuctiva clear without scleral icterus. Heart:  S1, S2 present without murmurs appreciated. Lungs:  Clear to auscultation bilaterally. No wheezes, rales, or rhonchi. No distress.  Abdomen:  +BS, soft, non-tender and non-distended. No rebound or guarding. No HSM or masses noted. Msk:  Symmetrical without gross deformities. Normal posture. Extremities:  Without edema. Neurologic:  Alert and  oriented x4 Psych:  Normal mood and affect.    Assessment:     Plan:  ***   Josette Centers, PA-C [facility] 01/27/2024

## 2024-01-27 ENCOUNTER — Encounter: Payer: Self-pay | Admitting: *Deleted

## 2024-01-27 ENCOUNTER — Encounter: Payer: Self-pay | Admitting: Gastroenterology

## 2024-01-27 ENCOUNTER — Ambulatory Visit: Admitting: Gastroenterology

## 2024-01-27 VITALS — BP 115/74 | HR 73 | Temp 98.0°F | Ht 63.0 in | Wt 135.8 lb

## 2024-01-27 DIAGNOSIS — R197 Diarrhea, unspecified: Secondary | ICD-10-CM

## 2024-01-27 DIAGNOSIS — R6881 Early satiety: Secondary | ICD-10-CM

## 2024-01-27 DIAGNOSIS — R634 Abnormal weight loss: Secondary | ICD-10-CM | POA: Diagnosis not present

## 2024-01-27 DIAGNOSIS — C884 Extranodal marginal zone b-cell lymphoma of mucosa-associated lymphoid tissue (malt-lymphoma) not having achieved remission: Secondary | ICD-10-CM | POA: Diagnosis not present

## 2024-01-27 DIAGNOSIS — K279 Peptic ulcer, site unspecified, unspecified as acute or chronic, without hemorrhage or perforation: Secondary | ICD-10-CM

## 2024-01-27 DIAGNOSIS — R1310 Dysphagia, unspecified: Secondary | ICD-10-CM

## 2024-01-27 DIAGNOSIS — K219 Gastro-esophageal reflux disease without esophagitis: Secondary | ICD-10-CM

## 2024-01-27 NOTE — Patient Instructions (Addendum)
 English Instructions:  On October 10th, start holding your pantoprazole . After 14 days, you can complete H pylori stool antigen test.   After you complete the H. pylori test, you can resume pantoprazole  twice a day.  While you are holding your pantoprazole , you can take over-the-counter famotidine  as needed if you develop severe reflux/heartburn.  However, you cannot take famotidine  within 48 hours of completing your H. pylori test.  Do not take Pepto-Bismol, Tums, Rolaids, or any other medication that can suppress the acid in your stomach while you are holding pantoprazole  for 14 days.  I am reaching out to Dr. Cindie to determine if and when you need a repeat upper endoscopy.  Keep your upcoming appointment with oncology on September 2.  You are scheduled for a CT scan at [facility] tomorrow.  See separate letter for details.  Josette Centers, PA-C [facility]   Spanish Instructions:  El 10 de [place]nam, comience a suspender el pantoprazol. Despus de [address], podr realizarse la prueba de antgenos de H. pylori en heces.  Despus de realizarse la prueba de H. pylori, podr reanudar el pantoprazol dos veces al da.  Mientras suspenda el pantoprazol, puede tomar famotidina de venta libre segn sea necesario si presenta reflujo/acidez estomacal grave. Sin embargo, no puede tomar famotidina dentro de las 48 horas posteriores a la realizacin de la prueba de H. pylori.  No tome Pepto-Bismol, Tums, Rolaids ni ningn otro medicamento que pueda suprimir la acidez estomacal mientras suspenda el pantoprazol durante [address]  Me comunicar con el Dr. Cindie para determinar si necesita una segunda endoscopia superior y cundo.  Asista a su prxima cita con oncologa el 2 de septiembre.  Tiene programada una tomografa computarizada en [employer]. Consulte la carta por separado para obtener ms detalles.  Josette Centers, PA-C [facility]

## 2024-01-28 ENCOUNTER — Telehealth: Payer: Self-pay | Admitting: Gastroenterology

## 2024-01-28 ENCOUNTER — Ambulatory Visit (HOSPITAL_COMMUNITY)
Admission: RE | Admit: 2024-01-28 | Discharge: 2024-01-28 | Disposition: A | Discharge status: Home / Self Care | Source: Ambulatory Visit | Attending: Internal Medicine | Admitting: Internal Medicine

## 2024-01-28 DIAGNOSIS — C884 Extranodal marginal zone b-cell lymphoma of mucosa-associated lymphoid tissue (malt-lymphoma) not having achieved remission: Secondary | ICD-10-CM | POA: Diagnosis not present

## 2024-01-28 DIAGNOSIS — I700 Atherosclerosis of aorta: Secondary | ICD-10-CM | POA: Insufficient documentation

## 2024-01-28 DIAGNOSIS — C8590 Non-Hodgkin lymphoma, unspecified, unspecified site: Secondary | ICD-10-CM | POA: Diagnosis not present

## 2024-01-28 DIAGNOSIS — N3289 Other specified disorders of bladder: Secondary | ICD-10-CM | POA: Insufficient documentation

## 2024-01-28 LAB — POCT I-STAT CREATININE: Creatinine, Ser: 0.7 mg/dL (ref 0.61–1.24)

## 2024-01-28 MED ORDER — IOHEXOL 9 MG/ML PO SOLN
ORAL | Status: AC
  Filled 2024-01-28: qty 1000

## 2024-01-28 MED ORDER — IOHEXOL 300 MG/ML  SOLN
100.0000 mL | Freq: Once | INTRAMUSCULAR | Status: AC | PRN
  Administered 2024-01-28: 100 mL via INTRAVENOUS

## 2024-01-28 NOTE — Telephone Encounter (Signed)
 Please let patient know I discussed his case with Dr. Cindie who recommend we hold off on plans for surveillance EGD until he has seen oncology to get their input.   Let's plan to see him back in the clinic in 3 months. We can always see him sooner if needed.   Ladonna, please arrange follow-up as per above.   *Please note, you will need a spanish interpreter for this patient.

## 2024-02-01 DIAGNOSIS — C884 Extranodal marginal zone b-cell lymphoma of mucosa-associated lymphoid tissue (malt-lymphoma) not having achieved remission: Secondary | ICD-10-CM | POA: Insufficient documentation

## 2024-02-01 NOTE — Assessment & Plan Note (Addendum)
 -  Repeat labs today: CBC with diff, CMP, LDH, Hepatitis panel, uric acd, beta 2 microglobulin. SPEP -PET CT scan - Flow cytometry of blood - FISH for MALT1 rearrangements,  -H.Pylori stool or breath test -Radiation oncology referral for ISRT

## 2024-02-01 NOTE — Assessment & Plan Note (Addendum)
 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 RT to the nodes  -Last PSA 11/2022: Normal -Unsure if patient is following with urology currently

## 2024-02-01 NOTE — Progress Notes (Unsigned)
 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 Gastric ulcers. Duodenitis.  -12/18/2023: Stomach, biopsy: Atypical nodular B-cell infiltrate   -The biopsy consists of several fragments of oxyntic mucosa with scattered nodules of small lymphocytes with a infiltrative type pattern. The majority of the cells in the nodules are CD20 positive B cells with only limited numbers of CD3 positive/CD5 positive T cells.  These B cells of interest weakly coexpress CD43 and are strongly BCL2 positive. They are negative for CD10, BCL6, CD5, cyclin D1 and CD23.  -An IHC stain for Helicobacter pylori organisms is negative.   -The overall findings are consistent with an atypical B-cell infiltrate and are highly suggestive of an extranodal marginal zone lymphoma of mucosa associated lymphoid tissue (i.e. MALToma)  -B cell rearrangement present  -01/28/2024: CT CAP: Symmetric wall thickening of a nondistended stomach. No pathologically enlarged lymph nodes in the chest, abdomen or pelvis. No splenomegaly.  History of Presenting Illness: Chase [name] 73 y.o. male is referred by Dr.Carver for MALToma.  Patient is accompanied by his daughter today  He has a past medical history of prostate cancer, diabetes, hypertension, hyperlipidemia.  He had a history of multiple polyps.  He had an EGD performed for nausea, early satiety and colonoscopy for right lower quadrant pain and diarrhea.  EGD showed esophagitis, gastric ulcers and duodenitis.  Biopsy from gastric ulcer revealed marginal zone lymphoma.  Today,he reports symptoms  of gastric reflux along with nausea that is sometimes controlled with pepcid .  He reports a weight loss of 7 pounds in the past 1 month.  Reports decreased appetite over the past 1 year.  He denies diarrhea, abdominal pain, fever, chills, night sweats or fatigue at this time.    Is a non-smoker, does not drink alcohol.  Lives with his wife in [place].  Medical History: Past Medical History:  Diagnosis Date   Arthritis    Cancer (HCC)    prostate 2017   Depression    Diabetes mellitus without complication (HCC)    Headache    High cholesterol    Hypertension    Inguinal hernia    rt   Neck pain    Numbness and tingling in hands    Urinary frequency     Surgical history: Past Surgical History:  Procedure Laterality Date   ANTERIOR CERVICAL DECOMP/DISCECTOMY FUSION N/A 04/04/2015   Procedure: ANTERIOR CERVICAL DECOMPRESSION/DISCECTOMY FUSION PLATING BONEGRAFT CERVICAL FOUR-FIVE CERVICAL FIVE-SIX;  Surgeon: Rockey Peru, MD;  Location: [facility];  Service: Neurosurgery;  Laterality: N/A;   BACK SURGERY     lower back with Dr. Peru   COLONOSCOPY N/A 12/18/2023   Procedure: COLONOSCOPY;  Surgeon: Cindie Carlin POUR, DO;  Location: [facility];  Service: Endoscopy;  Laterality: N/A;  10:00 AM, ASA 2   COLONOSCOPY WITH PROPOFOL  N/A 03/22/2019   Procedure: COLONOSCOPY WITH PROPOFOL ;  Surgeon: Harvey Margo CROME, MD;  Location: [facility];  Service: Endoscopy;  Laterality: N/A;  8:30am   ESOPHAGOGASTRODUODENOSCOPY N/A 12/18/2023   Procedure: EGD (ESOPHAGOGASTRODUODENOSCOPY);  Surgeon: Cindie Carlin POUR, DO;  Location: [facility];  Service: Endoscopy;  Laterality: N/A;   INGUINAL HERNIA  REPAIR Right 12/22/2018   Procedure: HERNIA REPAIR INGUINAL ADULT WITH MESH;  Surgeon: Mavis Anes, MD;  Location: [facility];  Service: General;  Laterality: Right;   POLYPECTOMY  03/22/2019   Procedure: POLYPECTOMY;  Surgeon: Harvey Margo CROME, MD;  Location: [facility];  Service: Endoscopy;;  Cold  snare polypectomy cecal, hepatic flexure polyps x 2,descending colon  polyps x 3,  and rectal polyps x 3    SHOULDER ARTHROSCOPY Right    UPPER EXTREMITY VENOGRAPHY N/A 07/22/2016   Procedure: Upper Extremity Venography - bilateral arm;  Surgeon: Gaile LELON New, MD;  Location: [facility];  Service: Cardiovascular;  Laterality: N/A;    Allergies:  has no known allergies.  Medications:  Current Outpatient Medications  Medication Sig Dispense Refill   atorvastatin  (LIPITOR) 40 MG tablet Take 1 tablet (40 mg total) by mouth every evening. 100 tablet 2   blood glucose meter kit and supplies KIT Dispense based on patient and insurance preference. Use up to four times daily as directed. 1 each 0   Blood Glucose Monitoring Suppl (ONETOUCH VERIO) w/Device KIT Take 1 each by mouth in the morning and at bedtime. Use to test blood sugar daily 1 kit 1   empagliflozin  (JARDIANCE ) 25 MG TABS tablet Take 1 tablet (25 mg total) by mouth daily before breakfast. 90 tablet 3   glucose blood test strip Use as instructed to test blood sugar once daily.  Please fill One touch verio test strips. DX:E11.69 100 each 12   lisinopril  (ZESTRIL ) 5 MG tablet Take 1 tablet (5 mg total) by mouth daily. 90 tablet 3   metFORMIN  (GLUCOPHAGE ) 500 MG tablet Take 2 tablets (1,000 mg total) by mouth 2 (two) times daily with a meal. 360 tablet 3   OneTouch Delica Lancets 33G MISC Use to check blood sugar 2 times daily. E11.69 100 each 4   pantoprazole  (PROTONIX ) 40 MG tablet Take 1 tablet (40 mg total) by mouth 2 (two) times daily. 60 tablet 11   tamsulosin  (FLOMAX ) 0.4 MG CAPS capsule Take 0.4 mg by mouth daily.     No current facility-administered medications for this visit.    Review of Systems: Constitutional: Denies fevers, chills or abnormal night sweats Eyes: Denies blurriness of vision, double vision or watery eyes Ears, nose, mouth, throat, and face: Denies mucositis or sore throat Respiratory: Denies cough,  dyspnea or wheezes Cardiovascular: Denies palpitation, chest discomfort or lower extremity swelling Gastrointestinal:  Denies nausea, heartburn or change in bowel habits Skin: Denies abnormal skin rashes Lymphatics: Denies new lymphadenopathy or easy bruising Neurological:Denies numbness, tingling or new weaknesses Behavioral/Psych: Mood is stable, no new changes  All other systems were reviewed with the patient and are negative.  Physical Examination: ECOG PERFORMANCE STATUS: 1 - Symptomatic but completely ambulatory  Vitals:   02/02/24 1118  BP: 118/74  Pulse: 72  Resp: 18  Temp: (!) 96.7 F (35.9 C)  SpO2: 97%   Filed Weights   02/02/24 1118  Weight: 133 lb 9.6 oz (60.6 kg)    GENERAL:alert, no distress and comfortable SKIN: skin color, texture, turgor are normal, no rashes or significant lesions LYMPH:  no palpable lymphadenopathy in the cervical, axillary or inguinal LUNGS: clear to auscultation and percussion with normal breathing effort HEART: regular rate & rhythm and no murmurs and no lower extremity edema ABDOMEN:abdomen soft, non-tender and normal bowel sounds Musculoskeletal:no cyanosis of digits and no clubbing  PSYCH: alert & oriented x 3 with fluent speech NEURO: no  focal motor/sensory deficits   Laboratory Data: I have reviewed the data as listed  Lab Results  Component Value Date   WBC 4.8 02/02/2024   HGB 16.7 02/02/2024   HCT 51.1 02/02/2024   MCV 90.1 02/02/2024   PLT 202 02/02/2024   Recent Labs    05/15/23 1016 10/12/23 1610 10/13/23 1015 11/16/23 1213 01/28/24 1632 02/02/24 1151  NA 139  --  139 138  --  138  K 4.4  --  4.2 4.3  --  4.1  CL 100  --  102 101  --  102  CO2 24  --  28 17*  --  21*  GLUCOSE 127*  --  179* 99  --  122*  BUN 9  --  12 17  --  14  CREATININE 0.58*   < > 0.57* 0.61* 0.70 0.66  CALCIUM  9.7  --  9.4 9.7  --  8.9  GFRNONAA  --   --   --   --   --  >60  PROT 6.6  --  6.3 6.5  --  6.9  ALBUMIN  4.4  --   --   4.7  --  4.2  AST 14  --  12 12  --  18  ALT 14  --  15 15  --  21  ALKPHOS 73  --   --  65  --  58  BILITOT 0.3  --  0.5 0.4  --  0.7   < > = values in this interval not displayed.    Radiographic Studies: I have personally reviewed the radiological images as listed and agreed with the findings in the report.  CT CHEST ABDOMEN PELVIS W CONTRAST CLINICAL DATA:  Mild lymphoma, staging.  * Tracking Code: BO *  EXAM: CT CHEST, ABDOMEN, AND PELVIS WITH CONTRAST  TECHNIQUE: Multidetector CT imaging of the chest, abdomen and pelvis was performed following the standard protocol during bolus administration of intravenous contrast.  RADIATION DOSE REDUCTION: This exam was performed according to the departmental dose-optimization program which includes automated exposure control, adjustment of the mA and/or kV according to patient size and/or use of iterative reconstruction technique.  CONTRAST:  OMNIPAQUE  IOHEXOL  300 MG/ML  SOLN  COMPARISON:  Multiple priors including CT Oct 12, 2023 and PET-CT August 03, 2020  FINDINGS: CT CHEST FINDINGS  Cardiovascular: Aortic atherosclerosis. Normal size heart. Trace pericardial effusion.  Mediastinum/Nodes: No suspicious thyroid nodule. No pathologically enlarged mediastinal, hilar or axillary lymph nodes. Patulous esophagus.  Lungs/Pleura: No suspicious pulmonary nodules or masses. No pleural effusion. No pneumothorax.  Musculoskeletal: Anterior cervical fusion hardware. Degenerative change of the right-greater-than-left shoulders. Thoracic spondylosis.  Lucency in the right posteromedial eighth rib present dating back to PET-CT August 03, 2020 without abnormal radiotracer uptake on that examination.  CT ABDOMEN PELVIS FINDINGS  Hepatobiliary: Stable too small to accurately characterize hepatic hypodensities favored to reflect cysts. Gallbladder is unremarkable. No biliary ductal dilation.  Pancreas: No pancreatic ductal  dilation evidence of acute inflammation.  Spleen: No splenomegaly.  Adrenals/Urinary Tract: No suspicious adrenal nodule/mass. Bilateral renal cysts and renal lesions technically too small to accurately characterize are similar to prior. Similar diffuse trabecular bladder wall thickening.  Stomach/Bowel: Diffuse wall thickening of a nondistended stomach. No pathologic dilation of small or large bowel. No evidence of bowel obstruction. Colonic stool burden compatible with constipation.  Vascular/Lymphatic: Aortic atherosclerosis. Normal caliber abdominal aorta. Smooth IVC contours. The portal, splenic and superior mesenteric veins are patent. No  pathologically enlarged abdominal or pelvic lymph nodes.  Reproductive: Brachytherapy seeds in the prostate gland.  Other: Soft tissue nodularity along the right inguinal canal for instance on image 69/3 has been present over multiple prior examinations and likely reflects a hernia repair plug.  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  No aggressive lytic or blastic lesion of bone. Posterior lumbosacral fusion hardware. Multilevel degenerative change of the spine.  IMPRESSION: 1. Symmetric wall thickening of a nondistended stomach. 2. No pathologically enlarged lymph nodes in the chest, abdomen or pelvis. 3. No splenomegaly. 4. Colonic stool burden compatible with constipation. 5. Similar diffuse trabecular bladder wall thickening, likely reflecting chronic outflow impedance. 6.  Aortic Atherosclerosis (ICD10-I70.0).  Electronically Signed   By: Reyes Holder M.D.   On: 02/02/2024 12:06    ASSESSMENT & PLAN:  Patient is a 73 y.o. male presenting for extra nodal marginal zone lymphoma of stomach  Assessment & Plan MALToma Probably stage I extra marginal zone lymphoma of stomach Pathology as above.  -Discussed the diagnosis and prognosis in detail with the patient.  -CT scan results were not  available at the time of discussion.  Will call the patient with result. - Discussed with the patient that if the disease is localized and H. pylori is positive, will consider treating H. pylori at that should treat the disease as well.  If H. pylori is negative then we will consider radiation therapy to the lesion. - CT results consistent with local disease with no metastasis - Repeat labs today: CBC with diff, CMP, LDH, Hepatitis panel, uric acid, beta 2 microglobulin. SPEP - Flow cytometry of blood - FISH for MALT1 rearrangements on pathology  - H.Pylori stool or breath test-patient is currently taking pantoprazole .  Discussed stopping pantoprazole  for 2 weeks and then doing the H. pylori stool test. 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 RT to the nodes  -Last PSA 11/2022: Normal -Unsure if patient is following with urology currently    Orders Placed This Encounter  Procedures   CBC with Differential    Standing Status:   Future    Number of Occurrences:   1    Expected Date:   02/02/2024    Expiration Date:   05/02/2024   Comprehensive metabolic panel    Standing Status:   Future    Number of Occurrences:   1    Expected Date:   02/02/2024    Expiration Date:   05/02/2024   Lactate dehydrogenase    Standing Status:   Future    Number of Occurrences:   1    Expected Date:   02/02/2024    Expiration Date:   05/02/2024   Beta 2 microglobulin, serum    Standing Status:   Future    Number of Occurrences:   1    Expected Date:   02/02/2024    Expiration Date:    05/02/2024   Protein electrophoresis, serum    Standing Status:   Future    Number of Occurrences:   1    Expected Date:   02/02/2024  Expiration Date:   05/02/2024   Uric acid    Standing Status:   Future    Number of Occurrences:   1    Expected Date:   02/02/2024    Expiration Date:   05/02/2024   Flow Cytometry, Peripheral Blood (Oncology)    Standing Status:   Future    Number of Occurrences:   1    Expected Date:   02/02/2024    Expiration Date:   05/02/2024    The total time spent in the appointment was 60 minutes encounter with patients including review of chart and various tests results, discussions about plan of care and coordination of care plan   All questions were answered. The patient knows to call the clinic with any problems, questions or concerns. No barriers to learning was detected.  Mickiel Dry, MD 9/2/20253:25 PM

## 2024-02-02 ENCOUNTER — Inpatient Hospital Stay

## 2024-02-02 ENCOUNTER — Inpatient Hospital Stay: Attending: Oncology | Admitting: Oncology

## 2024-02-02 VITALS — BP 118/74 | HR 72 | Temp 96.7°F | Resp 18 | Ht 62.0 in | Wt 133.6 lb

## 2024-02-02 DIAGNOSIS — C61 Malignant neoplasm of prostate: Secondary | ICD-10-CM | POA: Diagnosis not present

## 2024-02-02 DIAGNOSIS — K2980 Duodenitis without bleeding: Secondary | ICD-10-CM | POA: Insufficient documentation

## 2024-02-02 DIAGNOSIS — E785 Hyperlipidemia, unspecified: Secondary | ICD-10-CM | POA: Insufficient documentation

## 2024-02-02 DIAGNOSIS — E7800 Pure hypercholesterolemia, unspecified: Secondary | ICD-10-CM | POA: Diagnosis not present

## 2024-02-02 DIAGNOSIS — Z8546 Personal history of malignant neoplasm of prostate: Secondary | ICD-10-CM | POA: Insufficient documentation

## 2024-02-02 DIAGNOSIS — K2100 Gastro-esophageal reflux disease with esophagitis, without bleeding: Secondary | ICD-10-CM | POA: Diagnosis not present

## 2024-02-02 DIAGNOSIS — I700 Atherosclerosis of aorta: Secondary | ICD-10-CM | POA: Insufficient documentation

## 2024-02-02 DIAGNOSIS — C884 Extranodal marginal zone b-cell lymphoma of mucosa-associated lymphoid tissue (malt-lymphoma) not having achieved remission: Secondary | ICD-10-CM | POA: Insufficient documentation

## 2024-02-02 DIAGNOSIS — R6881 Early satiety: Secondary | ICD-10-CM | POA: Insufficient documentation

## 2024-02-02 DIAGNOSIS — Z8711 Personal history of peptic ulcer disease: Secondary | ICD-10-CM | POA: Insufficient documentation

## 2024-02-02 DIAGNOSIS — Z7984 Long term (current) use of oral hypoglycemic drugs: Secondary | ICD-10-CM | POA: Diagnosis not present

## 2024-02-02 DIAGNOSIS — Z79899 Other long term (current) drug therapy: Secondary | ICD-10-CM | POA: Diagnosis not present

## 2024-02-02 DIAGNOSIS — N3289 Other specified disorders of bladder: Secondary | ICD-10-CM | POA: Diagnosis not present

## 2024-02-02 DIAGNOSIS — R110 Nausea: Secondary | ICD-10-CM | POA: Diagnosis not present

## 2024-02-02 DIAGNOSIS — E119 Type 2 diabetes mellitus without complications: Secondary | ICD-10-CM | POA: Insufficient documentation

## 2024-02-02 DIAGNOSIS — I10 Essential (primary) hypertension: Secondary | ICD-10-CM | POA: Insufficient documentation

## 2024-02-02 DIAGNOSIS — R197 Diarrhea, unspecified: Secondary | ICD-10-CM | POA: Insufficient documentation

## 2024-02-02 DIAGNOSIS — M129 Arthropathy, unspecified: Secondary | ICD-10-CM | POA: Diagnosis not present

## 2024-02-02 LAB — URIC ACID: Uric Acid, Serum: 3.5 mg/dL — ABNORMAL LOW (ref 3.7–8.6)

## 2024-02-02 LAB — CBC WITH DIFFERENTIAL/PLATELET
Abs Immature Granulocytes: 0.09 K/uL — ABNORMAL HIGH (ref 0.00–0.07)
Basophils Absolute: 0 K/uL (ref 0.0–0.1)
Basophils Relative: 1 %
Eosinophils Absolute: 0.2 K/uL (ref 0.0–0.5)
Eosinophils Relative: 4 %
HCT: 51.1 % (ref 39.0–52.0)
Hemoglobin: 16.7 g/dL (ref 13.0–17.0)
Immature Granulocytes: 2 %
Lymphocytes Relative: 18 %
Lymphs Abs: 0.9 K/uL (ref 0.7–4.0)
MCH: 29.5 pg (ref 26.0–34.0)
MCHC: 32.7 g/dL (ref 30.0–36.0)
MCV: 90.1 fL (ref 80.0–100.0)
Monocytes Absolute: 0.3 K/uL (ref 0.1–1.0)
Monocytes Relative: 7 %
Neutro Abs: 3.2 K/uL (ref 1.7–7.7)
Neutrophils Relative %: 68 %
Platelets: 202 K/uL (ref 150–400)
RBC: 5.67 MIL/uL (ref 4.22–5.81)
RDW: 14.7 % (ref 11.5–15.5)
WBC: 4.8 K/uL (ref 4.0–10.5)
nRBC: 0 % (ref 0.0–0.2)

## 2024-02-02 LAB — COMPREHENSIVE METABOLIC PANEL WITH GFR
ALT: 21 U/L (ref 0–44)
AST: 18 U/L (ref 15–41)
Albumin: 4.2 g/dL (ref 3.5–5.0)
Alkaline Phosphatase: 58 U/L (ref 38–126)
Anion gap: 15 (ref 5–15)
BUN: 14 mg/dL (ref 8–23)
CO2: 21 mmol/L — ABNORMAL LOW (ref 22–32)
Calcium: 8.9 mg/dL (ref 8.9–10.3)
Chloride: 102 mmol/L (ref 98–111)
Creatinine, Ser: 0.66 mg/dL (ref 0.61–1.24)
GFR, Estimated: >60 mL/min (ref >60)
Glucose, Bld: 122 mg/dL — ABNORMAL HIGH (ref 70–99)
Potassium: 4.1 mmol/L (ref 3.5–5.1)
Sodium: 138 mmol/L (ref 135–145)
Total Bilirubin: 0.7 mg/dL (ref 0.0–1.2)
Total Protein: 6.9 g/dL (ref 6.5–8.1)

## 2024-02-02 LAB — LACTATE DEHYDROGENASE: LDH: 107 U/L (ref 98–192)

## 2024-02-02 NOTE — Patient Instructions (Addendum)
 [facility]  Discharge Instructions  You were seen and examined today by Dr. Davonna. Dr. Davonna is a medical oncologist, meaning that she specializes in the treatment of cancer diagnoses. Dr. Davonna discussed your past medical history, family history of cancers, and the events that led to you being here today.  You were referred to Dr. Davonna for a new diagnosis of MALT Lymphoma. This is a very curable type of cancer.  Your CT scan has not yet resulted.  Dr. Davonna has recommended a referral to Radiation Oncology. If the lymphoma is local to the stomach, you will not need any chemotherapy only radiation therapy. If it is outside of the stomach, we will then discuss chemotherapy.  Dr. Davonna recommends additional labs today.  Follow-up as scheduled.  Thank you for choosing [facility] to provide your oncology and hematology care.   To afford each patient quality time with our provider, please arrive at least 15 minutes before your scheduled appointment time. You may need to reschedule your appointment if you arrive late (10 or more minutes). Arriving late affects you and other patients whose appointments are after yours.  Also, if you miss three or more appointments without notifying the office, you may be dismissed from the clinic at the provider's discretion.    Again, thank you for choosing [facility].  Our hope is that these requests will decrease the amount of time that you wait before being seen by our physicians.   If you have a lab appointment with the Cancer Center - please note that after April 8th, all labs will be drawn in the cancer center.  You do not have to check in or register with the main entrance as you have in the past but will complete your check-in at the cancer center.            _____________________________________________________________  Should you have questions after your visit to [facility], please contact our office at [phone] and follow the prompts.  Our office hours are 8:00 a.m. to 4:30 p.m. Monday - Thursday and 8:00 a.m. to 2:30 p.m. Friday.  Please note that voicemails left after 4:00 p.m. may not be returned until the following business day.  We are closed weekends and all major holidays.  You do have access to a nurse 24-7, just call the main number to the clinic [phone] and do not press any options, hold on the line and a nurse will answer the phone.    For prescription refill requests, have your pharmacy contact our office and allow 72 hours.    Masks are no longer required in the cancer centers. If you would like for your care team to wear a mask while they are taking care of you, please let them know. You may have one support person who is at least 71 years old accompany you for your appointments.

## 2024-02-03 LAB — BETA 2 MICROGLOBULIN, SERUM: Beta-2 Microglobulin: 1.8 mg/L (ref 0.6–2.4)

## 2024-02-03 LAB — SURGICAL PATHOLOGY

## 2024-02-03 NOTE — Progress Notes (Signed)
 MALT1 FISH requested on [id number] ([id number]) per Dr. Davonna.

## 2024-02-04 LAB — PROTEIN ELECTROPHORESIS, SERUM
A/G Ratio: 1.3 (ref 0.7–1.7)
Albumin ELP: 3.8 g/dL (ref 2.9–4.4)
Alpha-1-Globulin: 0.2 g/dL (ref 0.0–0.4)
Alpha-2-Globulin: 1.1 g/dL — ABNORMAL HIGH (ref 0.4–1.0)
Beta Globulin: 1.1 g/dL (ref 0.7–1.3)
Gamma Globulin: 0.5 g/dL (ref 0.4–1.8)
Globulin, Total: 2.9 g/dL (ref 2.2–3.9)
Total Protein ELP: 6.7 g/dL (ref 6.0–8.5)

## 2024-02-05 DIAGNOSIS — R9721 Rising PSA following treatment for malignant neoplasm of prostate: Secondary | ICD-10-CM | POA: Diagnosis not present

## 2024-02-05 DIAGNOSIS — C884 Extranodal marginal zone b-cell lymphoma of mucosa-associated lymphoid tissue (malt-lymphoma) not having achieved remission: Secondary | ICD-10-CM | POA: Diagnosis not present

## 2024-02-05 DIAGNOSIS — E119 Type 2 diabetes mellitus without complications: Secondary | ICD-10-CM | POA: Diagnosis not present

## 2024-02-05 DIAGNOSIS — C61 Malignant neoplasm of prostate: Secondary | ICD-10-CM | POA: Diagnosis not present

## 2024-02-05 DIAGNOSIS — R109 Unspecified abdominal pain: Secondary | ICD-10-CM | POA: Diagnosis not present

## 2024-02-05 DIAGNOSIS — C169 Malignant neoplasm of stomach, unspecified: Secondary | ICD-10-CM | POA: Diagnosis not present

## 2024-02-05 DIAGNOSIS — R630 Anorexia: Secondary | ICD-10-CM | POA: Diagnosis not present

## 2024-02-05 DIAGNOSIS — I10 Essential (primary) hypertension: Secondary | ICD-10-CM | POA: Diagnosis not present

## 2024-02-08 ENCOUNTER — Other Ambulatory Visit: Payer: Self-pay

## 2024-02-08 DIAGNOSIS — C884 Extranodal marginal zone b-cell lymphoma of mucosa-associated lymphoid tissue (malt-lymphoma) not having achieved remission: Secondary | ICD-10-CM

## 2024-02-08 LAB — FLOW CYTOMETRY

## 2024-02-09 DIAGNOSIS — C61 Malignant neoplasm of prostate: Secondary | ICD-10-CM | POA: Diagnosis not present

## 2024-02-11 ENCOUNTER — Encounter (HOSPITAL_COMMUNITY): Payer: Self-pay | Admitting: Oncology

## 2024-02-11 ENCOUNTER — Telehealth: Payer: Self-pay | Admitting: Radiation Oncology

## 2024-02-11 NOTE — Telephone Encounter (Signed)
 9/11 Received medical Record request for treatment records and sent copy to D[facility].  Records email to Amy ([email or web address]).

## 2024-02-15 ENCOUNTER — Other Ambulatory Visit: Payer: Self-pay | Admitting: Family Medicine

## 2024-02-15 DIAGNOSIS — E1159 Type 2 diabetes mellitus with other circulatory complications: Secondary | ICD-10-CM

## 2024-02-16 DIAGNOSIS — C778 Secondary and unspecified malignant neoplasm of lymph nodes of multiple regions: Secondary | ICD-10-CM | POA: Diagnosis not present

## 2024-02-16 DIAGNOSIS — C61 Malignant neoplasm of prostate: Secondary | ICD-10-CM | POA: Diagnosis not present

## 2024-02-17 ENCOUNTER — Other Ambulatory Visit: Payer: Self-pay | Admitting: *Deleted

## 2024-02-17 ENCOUNTER — Other Ambulatory Visit: Payer: Self-pay

## 2024-02-17 DIAGNOSIS — R110 Nausea: Secondary | ICD-10-CM | POA: Diagnosis not present

## 2024-02-17 DIAGNOSIS — M129 Arthropathy, unspecified: Secondary | ICD-10-CM | POA: Diagnosis not present

## 2024-02-17 DIAGNOSIS — E119 Type 2 diabetes mellitus without complications: Secondary | ICD-10-CM | POA: Diagnosis not present

## 2024-02-17 DIAGNOSIS — I700 Atherosclerosis of aorta: Secondary | ICD-10-CM | POA: Diagnosis not present

## 2024-02-17 DIAGNOSIS — C884 Extranodal marginal zone b-cell lymphoma of mucosa-associated lymphoid tissue (malt-lymphoma) not having achieved remission: Secondary | ICD-10-CM

## 2024-02-17 DIAGNOSIS — Z8711 Personal history of peptic ulcer disease: Secondary | ICD-10-CM | POA: Diagnosis not present

## 2024-02-17 DIAGNOSIS — N3289 Other specified disorders of bladder: Secondary | ICD-10-CM | POA: Diagnosis not present

## 2024-02-17 DIAGNOSIS — Z79899 Other long term (current) drug therapy: Secondary | ICD-10-CM | POA: Diagnosis not present

## 2024-02-17 DIAGNOSIS — R6881 Early satiety: Secondary | ICD-10-CM | POA: Diagnosis not present

## 2024-02-17 DIAGNOSIS — E785 Hyperlipidemia, unspecified: Secondary | ICD-10-CM | POA: Diagnosis not present

## 2024-02-17 DIAGNOSIS — E7800 Pure hypercholesterolemia, unspecified: Secondary | ICD-10-CM | POA: Diagnosis not present

## 2024-02-17 DIAGNOSIS — Z8546 Personal history of malignant neoplasm of prostate: Secondary | ICD-10-CM | POA: Diagnosis not present

## 2024-02-17 DIAGNOSIS — K2100 Gastro-esophageal reflux disease with esophagitis, without bleeding: Secondary | ICD-10-CM | POA: Diagnosis not present

## 2024-02-17 DIAGNOSIS — K2980 Duodenitis without bleeding: Secondary | ICD-10-CM | POA: Diagnosis not present

## 2024-02-17 DIAGNOSIS — R197 Diarrhea, unspecified: Secondary | ICD-10-CM | POA: Diagnosis not present

## 2024-02-17 DIAGNOSIS — Z7984 Long term (current) use of oral hypoglycemic drugs: Secondary | ICD-10-CM | POA: Diagnosis not present

## 2024-02-17 DIAGNOSIS — I10 Essential (primary) hypertension: Secondary | ICD-10-CM | POA: Diagnosis not present

## 2024-02-18 ENCOUNTER — Ambulatory Visit (HOSPITAL_COMMUNITY): Admission: RE | Admit: 2024-02-18 | Source: Ambulatory Visit

## 2024-02-18 ENCOUNTER — Ambulatory Visit: Admitting: Family Medicine

## 2024-02-18 ENCOUNTER — Encounter: Payer: Self-pay | Admitting: Family Medicine

## 2024-02-18 VITALS — BP 117/71 | HR 72 | Ht 62.0 in | Wt 134.0 lb

## 2024-02-18 DIAGNOSIS — I152 Hypertension secondary to endocrine disorders: Secondary | ICD-10-CM

## 2024-02-18 DIAGNOSIS — Z23 Encounter for immunization: Secondary | ICD-10-CM

## 2024-02-18 DIAGNOSIS — E785 Hyperlipidemia, unspecified: Secondary | ICD-10-CM

## 2024-02-18 DIAGNOSIS — E1159 Type 2 diabetes mellitus with other circulatory complications: Secondary | ICD-10-CM

## 2024-02-18 DIAGNOSIS — C884 Extranodal marginal zone b-cell lymphoma of mucosa-associated lymphoid tissue (malt-lymphoma) not having achieved remission: Secondary | ICD-10-CM | POA: Diagnosis not present

## 2024-02-18 DIAGNOSIS — E1169 Type 2 diabetes mellitus with other specified complication: Secondary | ICD-10-CM | POA: Diagnosis not present

## 2024-02-18 DIAGNOSIS — F5104 Psychophysiologic insomnia: Secondary | ICD-10-CM

## 2024-02-18 LAB — BAYER DCA HB A1C WAIVED: HB A1C (BAYER DCA - WAIVED): 6.5 % — ABNORMAL HIGH (ref 4.8–5.6)

## 2024-02-18 MED ORDER — EMPAGLIFLOZIN 25 MG PO TABS: 25.0000 mg | ORAL_TABLET | Freq: Every day | ORAL | 3 refills | Status: DC

## 2024-02-18 MED ORDER — ATORVASTATIN CALCIUM 40 MG PO TABS: 40.0000 mg | ORAL_TABLET | Freq: Every evening | ORAL | 3 refills | Status: DC

## 2024-02-18 MED ORDER — LISINOPRIL 5 MG PO TABS: 5.0000 mg | ORAL_TABLET | Freq: Every day | ORAL | 3 refills | Status: DC

## 2024-02-18 MED ORDER — TRAZODONE HCL 50 MG PO TABS: 25.0000 mg | ORAL_TABLET | Freq: Every evening | ORAL | 3 refills | Status: AC | PRN

## 2024-02-18 MED ORDER — METFORMIN HCL 500 MG PO TABS: 1000.0000 mg | ORAL_TABLET | Freq: Two times a day (BID) | ORAL | 3 refills | Status: DC

## 2024-02-18 NOTE — Progress Notes (Signed)
 BP 117/71   Pulse 72   Ht 5' 2 (1.575 m)   Wt 134 lb (60.8 kg)   SpO2 97%   BMI 24.51 kg/m    Subjective:   Patient ID: Chase [name], male    DOB: April 02, 1951, 73 y.o.   MRN: [id number]  HPI: Chase [name] is a 73 y.o. male presenting on 02/18/2024 for Medical Management of Chronic Issues and Diabetes   Discussed the use of AI scribe software for clinical note transcription with the patient, who gave verbal consent to proceed.  History of Present Illness   [name] is a 73 year old male with diabetes who presents for a follow-up on his diabetes management.  He has not checked his blood sugar levels recently, except for a reading of 120 mg/dL while in [place]. He reports that he is taking metformin  and Jardiance  for diabetes. His recent A1c was 6.5%.  He reports taking lisinopril  for hypertension and atorvastatin  for cholesterol. Additionally, he reports taking tamsulosin .  He mentions a recent visit to his oncologist, where he was informed that his cancer is progressing. He underwent a colonoscopy that revealed an ulcer with cancer, and he is scheduled to start radiation therapy next week. He recalls being told that the cancer has increased, possibly to a value around 62.  He experiences daily headaches, affecting the whole head, sometimes shifting from one side to another. He associates the headaches with lack of sleep, noting improvement with adequate rest. He has been using Tylenol , but it has not been effective.  He reports numbness in his feet, specifically on the sides, and has not seen an eye doctor this year despite noticing worsening vision.  His car is not functioning, impacting his ability to pick up medications. He prefers receiving medications for three months at a time to avoid frequent trips to the pharmacy.          Relevant past medical, surgical, family and social history reviewed and updated as indicated. Interim medical history since our  last visit reviewed. Allergies and medications reviewed and updated.  Review of Systems  Constitutional:  Negative for chills and fever.  Eyes:  Negative for visual disturbance.  Respiratory:  Negative for shortness of breath and wheezing.   Cardiovascular:  Negative for chest pain and leg swelling.  Musculoskeletal:  Negative for back pain and gait problem.  Skin:  Negative for rash.  Neurological:  Positive for headaches. Negative for dizziness, light-headedness and numbness.  Psychiatric/Behavioral:  Positive for sleep disturbance. Negative for self-injury and suicidal ideas. The patient is nervous/anxious.   All other systems reviewed and are negative.   Per HPI unless specifically indicated above   Allergies as of 02/18/2024   No Known Allergies      Medication List        Accurate as of February 18, 2024  1:42 PM. If you have any questions, ask your nurse or doctor.          atorvastatin  40 MG tablet Commonly known as: LIPITOR Take 1 tablet (40 mg total) by mouth every evening.   blood glucose meter kit and supplies Kit Dispense based on patient and insurance preference. Use up to four times daily as directed.   empagliflozin  25 MG Tabs tablet Commonly known as: Jardiance  Take 1 tablet (25 mg total) by mouth daily before breakfast.   glucose blood test strip Use as instructed to test blood sugar once daily.  Please fill One touch verio test  strips. DX:E11.69   lisinopril  5 MG tablet Commonly known as: ZESTRIL  Take 1 tablet (5 mg total) by mouth daily.   metFORMIN  500 MG tablet Commonly known as: GLUCOPHAGE  Take 2 tablets (1,000 mg total) by mouth 2 (two) times daily with a meal.   OneTouch Delica Lancets 33G Misc Use to check blood sugar 2 times daily. E11.69   OneTouch Verio w/Device Kit Take 1 each by mouth in the morning and at bedtime. Use to test blood sugar daily   pantoprazole  40 MG tablet Commonly known as: PROTONIX  Take 1 tablet (40 mg  total) by mouth 2 (two) times daily.   tamsulosin  0.4 MG Caps capsule Commonly known as: FLOMAX  Take 0.4 mg by mouth daily.   traZODone  50 MG tablet Commonly known as: DESYREL  Take 0.5-1 tablets (25-50 mg total) by mouth at bedtime as needed for sleep. Started by: Fonda LABOR Samai Corea         Objective:   BP 117/71   Pulse 72   Ht 5' 2 (1.575 m)   Wt 134 lb (60.8 kg)   SpO2 97%   BMI 24.51 kg/m   Wt Readings from Last 3 Encounters:  02/18/24 134 lb (60.8 kg)  02/02/24 133 lb 9.6 oz (60.6 kg)  01/27/24 135 lb 12.8 oz (61.6 kg)    Physical Exam Physical Exam   VITALS: BP- 117/71 NECK: Thyroid normal. CHEST: Lungs clear to auscultation bilaterally. CARDIOVASCULAR: Heart regular rate and rhythm, normal heart sounds.         Assessment & Plan:   Problem List Items Addressed This Visit       Cardiovascular and Mediastinum   Hypertension associated with diabetes (HCC)   Relevant Medications   atorvastatin  (LIPITOR) 40 MG tablet   empagliflozin  (JARDIANCE ) 25 MG TABS tablet   lisinopril  (ZESTRIL ) 5 MG tablet   metFORMIN  (GLUCOPHAGE ) 500 MG tablet     Endocrine   Type 2 diabetes mellitus with other specified complication (HCC) - Primary   Relevant Medications   atorvastatin  (LIPITOR) 40 MG tablet   empagliflozin  (JARDIANCE ) 25 MG TABS tablet   lisinopril  (ZESTRIL ) 5 MG tablet   metFORMIN  (GLUCOPHAGE ) 500 MG tablet   Other Relevant Orders   Bayer DCA Hb A1c Waived   Hyperlipidemia associated with type 2 diabetes mellitus (HCC)   Relevant Medications   atorvastatin  (LIPITOR) 40 MG tablet   empagliflozin  (JARDIANCE ) 25 MG TABS tablet   lisinopril  (ZESTRIL ) 5 MG tablet   metFORMIN  (GLUCOPHAGE ) 500 MG tablet     Other   MALToma   Other Visit Diagnoses       Psychophysiological insomnia       Relevant Medications   traZODone  (DESYREL ) 50 MG tablet          Active colon cancer with ulceration Recent colonoscopy showed ulcer with cancer. Cancer markers  increased to 0.62. He is optimistic about radiation therapy outcome. - Proceed with planned radiation therapy.  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 Continues on metformin  and Jardiance . - Continue metformin  and Jardiance .  Chronic headache associated with insomnia Chronic daily headaches associated with insomnia. Headaches improve with better sleep. Tylenol  ineffective. Insomnia likely exacerbating headaches. - Prescribe trazodone  one hour before bedtime for sleep. - Advise trazodone  use as needed for sleep improvement.  Hypertension Blood pressure well-controlled at 117/71 mmHg. Continues on lisinopril . - Continue lisinopril .  Hyperlipidemia Managed with atorvastatin . - Continue atorvastatin .  prostatic hyperplasia with lower urinary tract symptoms  Managed with tamsulosin . - Continue tamsulosin .          Follow up plan: Return in about 3 months (around 05/19/2024), or if symptoms worsen or fail to improve, for Diabetes recheck.  Counseling provided for all of the vaccine components Orders Placed This Encounter  Procedures   Bayer DCA Hb A1c Waived    Fonda Levins, MD [facility] 02/18/2024, 1:42 PM

## 2024-02-19 LAB — H. PYLORI ANTIGEN, STOOL: H. Pylori Stool Ag, Eia: NEGATIVE

## 2024-03-07 NOTE — Progress Notes (Signed)
 Multiple attempts made to reach patient and his daughter regarding missed appointments, phone call is answered and then abruptly ended immediately. Unable to leave VM or speak with patient and family directly.

## 2024-03-17 ENCOUNTER — Ambulatory Visit (HOSPITAL_COMMUNITY)
Admission: RE | Admit: 2024-03-17 | Discharge: 2024-03-17 | Disposition: A | Discharge status: Home / Self Care | Source: Ambulatory Visit | Attending: Oncology | Admitting: Oncology

## 2024-03-17 DIAGNOSIS — C884 Extranodal marginal zone b-cell lymphoma of mucosa-associated lymphoid tissue (malt-lymphoma) not having achieved remission: Secondary | ICD-10-CM | POA: Insufficient documentation

## 2024-03-17 MED ORDER — FLUDEOXYGLUCOSE F - 18 (FDG) INJECTION
6.6700 | Freq: Once | INTRAVENOUS | Status: AC | PRN
  Administered 2024-03-17: 6.67 via INTRAVENOUS

## 2024-04-05 DIAGNOSIS — R109 Unspecified abdominal pain: Secondary | ICD-10-CM | POA: Diagnosis not present

## 2024-04-05 DIAGNOSIS — C884 Extranodal marginal zone b-cell lymphoma of mucosa-associated lymphoid tissue (malt-lymphoma) not having achieved remission: Secondary | ICD-10-CM | POA: Diagnosis not present

## 2024-04-05 DIAGNOSIS — R9721 Rising PSA following treatment for malignant neoplasm of prostate: Secondary | ICD-10-CM | POA: Diagnosis not present

## 2024-04-05 DIAGNOSIS — R630 Anorexia: Secondary | ICD-10-CM | POA: Diagnosis not present

## 2024-04-05 DIAGNOSIS — C169 Malignant neoplasm of stomach, unspecified: Secondary | ICD-10-CM | POA: Diagnosis not present

## 2024-04-05 DIAGNOSIS — E119 Type 2 diabetes mellitus without complications: Secondary | ICD-10-CM | POA: Diagnosis not present

## 2024-04-05 DIAGNOSIS — I10 Essential (primary) hypertension: Secondary | ICD-10-CM | POA: Diagnosis not present

## 2024-04-05 DIAGNOSIS — C61 Malignant neoplasm of prostate: Secondary | ICD-10-CM | POA: Diagnosis not present

## 2024-04-06 DIAGNOSIS — R109 Unspecified abdominal pain: Secondary | ICD-10-CM | POA: Diagnosis not present

## 2024-04-06 DIAGNOSIS — I10 Essential (primary) hypertension: Secondary | ICD-10-CM | POA: Diagnosis not present

## 2024-04-06 DIAGNOSIS — R630 Anorexia: Secondary | ICD-10-CM | POA: Diagnosis not present

## 2024-04-06 DIAGNOSIS — C169 Malignant neoplasm of stomach, unspecified: Secondary | ICD-10-CM | POA: Diagnosis not present

## 2024-04-06 DIAGNOSIS — R9721 Rising PSA following treatment for malignant neoplasm of prostate: Secondary | ICD-10-CM | POA: Diagnosis not present

## 2024-04-06 DIAGNOSIS — E119 Type 2 diabetes mellitus without complications: Secondary | ICD-10-CM | POA: Diagnosis not present

## 2024-04-06 DIAGNOSIS — C884 Extranodal marginal zone b-cell lymphoma of mucosa-associated lymphoid tissue (malt-lymphoma) not having achieved remission: Secondary | ICD-10-CM | POA: Diagnosis not present

## 2024-04-06 DIAGNOSIS — C61 Malignant neoplasm of prostate: Secondary | ICD-10-CM | POA: Diagnosis not present

## 2024-04-11 DIAGNOSIS — I10 Essential (primary) hypertension: Secondary | ICD-10-CM | POA: Diagnosis not present

## 2024-04-11 DIAGNOSIS — Z299 Encounter for prophylactic measures, unspecified: Secondary | ICD-10-CM | POA: Diagnosis not present

## 2024-04-11 DIAGNOSIS — E119 Type 2 diabetes mellitus without complications: Secondary | ICD-10-CM | POA: Diagnosis not present

## 2024-04-11 DIAGNOSIS — R109 Unspecified abdominal pain: Secondary | ICD-10-CM | POA: Diagnosis not present

## 2024-04-11 DIAGNOSIS — R9721 Rising PSA following treatment for malignant neoplasm of prostate: Secondary | ICD-10-CM | POA: Diagnosis not present

## 2024-04-11 DIAGNOSIS — C61 Malignant neoplasm of prostate: Secondary | ICD-10-CM | POA: Diagnosis not present

## 2024-04-11 DIAGNOSIS — C169 Malignant neoplasm of stomach, unspecified: Secondary | ICD-10-CM | POA: Diagnosis not present

## 2024-04-11 DIAGNOSIS — C884 Extranodal marginal zone b-cell lymphoma of mucosa-associated lymphoid tissue (malt-lymphoma) not having achieved remission: Secondary | ICD-10-CM | POA: Diagnosis not present

## 2024-04-11 DIAGNOSIS — R630 Anorexia: Secondary | ICD-10-CM | POA: Diagnosis not present

## 2024-04-15 DIAGNOSIS — R9721 Rising PSA following treatment for malignant neoplasm of prostate: Secondary | ICD-10-CM | POA: Diagnosis not present

## 2024-04-15 DIAGNOSIS — R109 Unspecified abdominal pain: Secondary | ICD-10-CM | POA: Diagnosis not present

## 2024-04-15 DIAGNOSIS — C61 Malignant neoplasm of prostate: Secondary | ICD-10-CM | POA: Diagnosis not present

## 2024-04-15 DIAGNOSIS — E119 Type 2 diabetes mellitus without complications: Secondary | ICD-10-CM | POA: Diagnosis not present

## 2024-04-15 DIAGNOSIS — C169 Malignant neoplasm of stomach, unspecified: Secondary | ICD-10-CM | POA: Diagnosis not present

## 2024-04-15 DIAGNOSIS — C884 Extranodal marginal zone b-cell lymphoma of mucosa-associated lymphoid tissue (malt-lymphoma) not having achieved remission: Secondary | ICD-10-CM | POA: Diagnosis not present

## 2024-04-15 DIAGNOSIS — I10 Essential (primary) hypertension: Secondary | ICD-10-CM | POA: Diagnosis not present

## 2024-04-15 DIAGNOSIS — R630 Anorexia: Secondary | ICD-10-CM | POA: Diagnosis not present

## 2024-04-18 DIAGNOSIS — R109 Unspecified abdominal pain: Secondary | ICD-10-CM | POA: Diagnosis not present

## 2024-04-18 DIAGNOSIS — C884 Extranodal marginal zone b-cell lymphoma of mucosa-associated lymphoid tissue (malt-lymphoma) not having achieved remission: Secondary | ICD-10-CM | POA: Diagnosis not present

## 2024-04-18 DIAGNOSIS — R630 Anorexia: Secondary | ICD-10-CM | POA: Diagnosis not present

## 2024-04-18 DIAGNOSIS — I10 Essential (primary) hypertension: Secondary | ICD-10-CM | POA: Diagnosis not present

## 2024-04-18 DIAGNOSIS — C169 Malignant neoplasm of stomach, unspecified: Secondary | ICD-10-CM | POA: Diagnosis not present

## 2024-04-18 DIAGNOSIS — R9721 Rising PSA following treatment for malignant neoplasm of prostate: Secondary | ICD-10-CM | POA: Diagnosis not present

## 2024-04-18 DIAGNOSIS — E119 Type 2 diabetes mellitus without complications: Secondary | ICD-10-CM | POA: Diagnosis not present

## 2024-04-18 DIAGNOSIS — C61 Malignant neoplasm of prostate: Secondary | ICD-10-CM | POA: Diagnosis not present

## 2024-04-19 DIAGNOSIS — R109 Unspecified abdominal pain: Secondary | ICD-10-CM | POA: Diagnosis not present

## 2024-04-19 DIAGNOSIS — R630 Anorexia: Secondary | ICD-10-CM | POA: Diagnosis not present

## 2024-04-19 DIAGNOSIS — I10 Essential (primary) hypertension: Secondary | ICD-10-CM | POA: Diagnosis not present

## 2024-04-19 DIAGNOSIS — C169 Malignant neoplasm of stomach, unspecified: Secondary | ICD-10-CM | POA: Diagnosis not present

## 2024-04-19 DIAGNOSIS — R9721 Rising PSA following treatment for malignant neoplasm of prostate: Secondary | ICD-10-CM | POA: Diagnosis not present

## 2024-04-19 DIAGNOSIS — C61 Malignant neoplasm of prostate: Secondary | ICD-10-CM | POA: Diagnosis not present

## 2024-04-19 DIAGNOSIS — E119 Type 2 diabetes mellitus without complications: Secondary | ICD-10-CM | POA: Diagnosis not present

## 2024-04-20 DIAGNOSIS — R630 Anorexia: Secondary | ICD-10-CM | POA: Diagnosis not present

## 2024-04-20 DIAGNOSIS — R109 Unspecified abdominal pain: Secondary | ICD-10-CM | POA: Diagnosis not present

## 2024-04-20 DIAGNOSIS — C61 Malignant neoplasm of prostate: Secondary | ICD-10-CM | POA: Diagnosis not present

## 2024-04-20 DIAGNOSIS — R9721 Rising PSA following treatment for malignant neoplasm of prostate: Secondary | ICD-10-CM | POA: Diagnosis not present

## 2024-04-20 DIAGNOSIS — E119 Type 2 diabetes mellitus without complications: Secondary | ICD-10-CM | POA: Diagnosis not present

## 2024-04-20 DIAGNOSIS — I10 Essential (primary) hypertension: Secondary | ICD-10-CM | POA: Diagnosis not present

## 2024-04-20 DIAGNOSIS — C169 Malignant neoplasm of stomach, unspecified: Secondary | ICD-10-CM | POA: Diagnosis not present

## 2024-04-21 DIAGNOSIS — C169 Malignant neoplasm of stomach, unspecified: Secondary | ICD-10-CM | POA: Diagnosis not present

## 2024-04-21 DIAGNOSIS — E119 Type 2 diabetes mellitus without complications: Secondary | ICD-10-CM | POA: Diagnosis not present

## 2024-04-21 DIAGNOSIS — R630 Anorexia: Secondary | ICD-10-CM | POA: Diagnosis not present

## 2024-04-21 DIAGNOSIS — I10 Essential (primary) hypertension: Secondary | ICD-10-CM | POA: Diagnosis not present

## 2024-04-21 DIAGNOSIS — C61 Malignant neoplasm of prostate: Secondary | ICD-10-CM | POA: Diagnosis not present

## 2024-04-21 DIAGNOSIS — C884 Extranodal marginal zone b-cell lymphoma of mucosa-associated lymphoid tissue (malt-lymphoma) not having achieved remission: Secondary | ICD-10-CM | POA: Diagnosis not present

## 2024-04-21 DIAGNOSIS — R109 Unspecified abdominal pain: Secondary | ICD-10-CM | POA: Diagnosis not present

## 2024-04-21 DIAGNOSIS — R9721 Rising PSA following treatment for malignant neoplasm of prostate: Secondary | ICD-10-CM | POA: Diagnosis not present

## 2024-04-22 DIAGNOSIS — C884 Extranodal marginal zone b-cell lymphoma of mucosa-associated lymphoid tissue (malt-lymphoma) not having achieved remission: Secondary | ICD-10-CM | POA: Diagnosis not present

## 2024-04-22 DIAGNOSIS — R9721 Rising PSA following treatment for malignant neoplasm of prostate: Secondary | ICD-10-CM | POA: Diagnosis not present

## 2024-04-22 DIAGNOSIS — I10 Essential (primary) hypertension: Secondary | ICD-10-CM | POA: Diagnosis not present

## 2024-04-22 DIAGNOSIS — C61 Malignant neoplasm of prostate: Secondary | ICD-10-CM | POA: Diagnosis not present

## 2024-04-22 DIAGNOSIS — R109 Unspecified abdominal pain: Secondary | ICD-10-CM | POA: Diagnosis not present

## 2024-04-22 DIAGNOSIS — R630 Anorexia: Secondary | ICD-10-CM | POA: Diagnosis not present

## 2024-04-22 DIAGNOSIS — C169 Malignant neoplasm of stomach, unspecified: Secondary | ICD-10-CM | POA: Diagnosis not present

## 2024-04-22 DIAGNOSIS — E119 Type 2 diabetes mellitus without complications: Secondary | ICD-10-CM | POA: Diagnosis not present

## 2024-04-25 DIAGNOSIS — R109 Unspecified abdominal pain: Secondary | ICD-10-CM | POA: Diagnosis not present

## 2024-04-25 DIAGNOSIS — C884 Extranodal marginal zone b-cell lymphoma of mucosa-associated lymphoid tissue (malt-lymphoma) not having achieved remission: Secondary | ICD-10-CM | POA: Diagnosis not present

## 2024-04-25 DIAGNOSIS — R9721 Rising PSA following treatment for malignant neoplasm of prostate: Secondary | ICD-10-CM | POA: Diagnosis not present

## 2024-04-25 DIAGNOSIS — C169 Malignant neoplasm of stomach, unspecified: Secondary | ICD-10-CM | POA: Diagnosis not present

## 2024-04-25 DIAGNOSIS — E119 Type 2 diabetes mellitus without complications: Secondary | ICD-10-CM | POA: Diagnosis not present

## 2024-04-25 DIAGNOSIS — I10 Essential (primary) hypertension: Secondary | ICD-10-CM | POA: Diagnosis not present

## 2024-04-25 DIAGNOSIS — C61 Malignant neoplasm of prostate: Secondary | ICD-10-CM | POA: Diagnosis not present

## 2024-04-25 DIAGNOSIS — R630 Anorexia: Secondary | ICD-10-CM | POA: Diagnosis not present

## 2024-04-26 ENCOUNTER — Encounter: Payer: Self-pay | Admitting: Internal Medicine

## 2024-04-26 DIAGNOSIS — C169 Malignant neoplasm of stomach, unspecified: Secondary | ICD-10-CM | POA: Diagnosis not present

## 2024-04-26 DIAGNOSIS — C61 Malignant neoplasm of prostate: Secondary | ICD-10-CM | POA: Diagnosis not present

## 2024-04-26 DIAGNOSIS — R630 Anorexia: Secondary | ICD-10-CM | POA: Diagnosis not present

## 2024-04-26 DIAGNOSIS — R9721 Rising PSA following treatment for malignant neoplasm of prostate: Secondary | ICD-10-CM | POA: Diagnosis not present

## 2024-04-26 DIAGNOSIS — I10 Essential (primary) hypertension: Secondary | ICD-10-CM | POA: Diagnosis not present

## 2024-04-26 DIAGNOSIS — R109 Unspecified abdominal pain: Secondary | ICD-10-CM | POA: Diagnosis not present

## 2024-04-26 DIAGNOSIS — E119 Type 2 diabetes mellitus without complications: Secondary | ICD-10-CM | POA: Diagnosis not present

## 2024-04-27 DIAGNOSIS — E119 Type 2 diabetes mellitus without complications: Secondary | ICD-10-CM | POA: Diagnosis not present

## 2024-04-27 DIAGNOSIS — R9721 Rising PSA following treatment for malignant neoplasm of prostate: Secondary | ICD-10-CM | POA: Diagnosis not present

## 2024-04-27 DIAGNOSIS — R109 Unspecified abdominal pain: Secondary | ICD-10-CM | POA: Diagnosis not present

## 2024-04-27 DIAGNOSIS — C61 Malignant neoplasm of prostate: Secondary | ICD-10-CM | POA: Diagnosis not present

## 2024-04-27 DIAGNOSIS — R630 Anorexia: Secondary | ICD-10-CM | POA: Diagnosis not present

## 2024-04-27 DIAGNOSIS — I10 Essential (primary) hypertension: Secondary | ICD-10-CM | POA: Diagnosis not present

## 2024-04-27 DIAGNOSIS — C169 Malignant neoplasm of stomach, unspecified: Secondary | ICD-10-CM | POA: Diagnosis not present

## 2024-05-02 ENCOUNTER — Other Ambulatory Visit: Payer: Self-pay | Admitting: *Deleted

## 2024-05-02 DIAGNOSIS — E1159 Type 2 diabetes mellitus with other circulatory complications: Secondary | ICD-10-CM

## 2024-05-02 DIAGNOSIS — E1169 Type 2 diabetes mellitus with other specified complication: Secondary | ICD-10-CM

## 2024-05-02 MED ORDER — METFORMIN HCL 500 MG PO TABS: 1000.0000 mg | ORAL_TABLET | Freq: Two times a day (BID) | ORAL | 2 refills | Status: AC

## 2024-05-02 MED ORDER — EMPAGLIFLOZIN 25 MG PO TABS: 25.0000 mg | ORAL_TABLET | Freq: Every day | ORAL | 2 refills | Status: AC

## 2024-05-02 MED ORDER — LISINOPRIL 5 MG PO TABS: 5.0000 mg | ORAL_TABLET | Freq: Every day | ORAL | 2 refills | Status: AC

## 2024-05-02 MED ORDER — ATORVASTATIN CALCIUM 40 MG PO TABS: 40.0000 mg | ORAL_TABLET | Freq: Every evening | ORAL | 2 refills | Status: AC

## 2024-05-02 NOTE — Telephone Encounter (Signed)
 [employer] RFs for yr's worth in Sept sent to [facility], remaining RFs sent to mail order pharmacy

## 2024-05-03 DIAGNOSIS — C884 Extranodal marginal zone b-cell lymphoma of mucosa-associated lymphoid tissue (malt-lymphoma) not having achieved remission: Secondary | ICD-10-CM | POA: Diagnosis not present

## 2024-05-04 DIAGNOSIS — C884 Extranodal marginal zone b-cell lymphoma of mucosa-associated lymphoid tissue (malt-lymphoma) not having achieved remission: Secondary | ICD-10-CM | POA: Diagnosis not present

## 2024-05-10 DIAGNOSIS — C884 Extranodal marginal zone b-cell lymphoma of mucosa-associated lymphoid tissue (malt-lymphoma) not having achieved remission: Secondary | ICD-10-CM | POA: Diagnosis not present

## 2024-05-17 DIAGNOSIS — C884 Extranodal marginal zone b-cell lymphoma of mucosa-associated lymphoid tissue (malt-lymphoma) not having achieved remission: Secondary | ICD-10-CM | POA: Diagnosis not present

## 2024-05-19 ENCOUNTER — Ambulatory Visit: Payer: Self-pay | Admitting: Family Medicine

## 2024-05-19 ENCOUNTER — Encounter: Payer: Self-pay | Admitting: Family Medicine

## 2024-05-19 VITALS — BP 135/84 | HR 98 | Ht 62.0 in | Wt 128.0 lb

## 2024-05-19 DIAGNOSIS — Z7984 Long term (current) use of oral hypoglycemic drugs: Secondary | ICD-10-CM

## 2024-05-19 DIAGNOSIS — E1169 Type 2 diabetes mellitus with other specified complication: Secondary | ICD-10-CM

## 2024-05-19 DIAGNOSIS — E785 Hyperlipidemia, unspecified: Secondary | ICD-10-CM

## 2024-05-19 DIAGNOSIS — C61 Malignant neoplasm of prostate: Secondary | ICD-10-CM

## 2024-05-19 DIAGNOSIS — I152 Hypertension secondary to endocrine disorders: Secondary | ICD-10-CM | POA: Diagnosis not present

## 2024-05-19 DIAGNOSIS — E1159 Type 2 diabetes mellitus with other circulatory complications: Secondary | ICD-10-CM

## 2024-05-19 LAB — BAYER DCA HB A1C WAIVED: HB A1C (BAYER DCA - WAIVED): 6 % — ABNORMAL HIGH (ref 4.8–5.6)

## 2024-05-19 NOTE — Progress Notes (Signed)
 BP 135/84   Pulse 98   Ht 5' 2 (1.575 m)   Wt 128 lb (58.1 kg)   SpO2 97%   BMI 23.41 kg/m    Subjective:   Patient ID: Chase [name], male    DOB: 08/21/1950, 73 y.o.   MRN: [id number]  HPI: Chase [name] is a 74 y.o. male presenting on 05/19/2024 for Medical Management of Chronic Issues and Diabetes   Discussed the use of AI scribe software for clinical note transcription with the patient, who gave verbal consent to proceed.  History of Present Illness   Chase [name] is a 73 year old male with cancer who presents with post-radiation treatment symptoms.  Post-radiation therapy symptoms - Completed radiation treatment for cancer one to two days ago - Experiencing significant difficulty eating - Persistent nausea despite medication - Managing nutrition with Ensure three times daily and small amounts of fruit  Diabetes management - Managing diabetes with metformin  and Jardiance  daily - No recent blood glucose measurements available - Ate breakfast today  Shoulder pain - Ongoing shoulder pain - Previously managed with injection and topical cream - Pain remains unchanged - Has run out of topical cream          05/19/2024    1:48 PM 02/18/2024    1:32 PM 02/02/2024   11:34 AM 02/02/2024   11:24 AM 12/10/2023   11:54 AM  Depression screen PHQ 2/9  Decreased Interest 2 3 2  0 1  Down, Depressed, Hopeless 2 3 1  0 1  PHQ - 2 Score 4 6 3  0 2  Altered sleeping 2 3   1   Tired, decreased energy 1 3 1  1   Change in appetite 1 3 1  3   Feeling bad or failure about yourself  1 2 0  1  Trouble concentrating 1 2 0  0  Moving slowly or fidgety/restless 1 2 0  0  Suicidal thoughts 0 0 0  0  PHQ-9 Score 11 21    8    Difficult doing work/chores Not difficult at all Somewhat difficult        Data saved with a previous flowsheet row definition    Patient denies being depressed, he thinks he just answered the questionnaire well when questioned about it.  Relevant past  medical, surgical, family and social history reviewed and updated as indicated. Interim medical history since our last visit reviewed. Allergies and medications reviewed and updated.  Review of Systems  Constitutional:  Negative for chills and fever.  Eyes:  Negative for visual disturbance.  Respiratory:  Negative for shortness of breath and wheezing.   Cardiovascular:  Negative for chest pain and leg swelling.  Musculoskeletal:  Negative for back pain and gait problem.  Skin:  Negative for rash.  All other systems reviewed and are negative.   Per HPI unless specifically indicated above   Allergies as of 05/19/2024   No Known Allergies      Medication List        Accurate as of May 19, 2024  2:14 PM. If you have any questions, ask your nurse or doctor.          atorvastatin  40 MG tablet Commonly known as: LIPITOR Take 1 tablet (40 mg total) by mouth every evening.   blood glucose meter kit and supplies Kit Dispense based on patient and insurance preference. Use up to four times daily as directed.   empagliflozin  25 MG Tabs tablet Commonly known as: Jardiance   Take 1 tablet (25 mg total) by mouth daily before breakfast.   glucose blood test strip Use as instructed to test blood sugar once daily.  Please fill One touch verio test strips. DX:E11.69   lisinopril  5 MG tablet Commonly known as: ZESTRIL  Take 1 tablet (5 mg total) by mouth daily.   metFORMIN  500 MG tablet Commonly known as: GLUCOPHAGE  Take 2 tablets (1,000 mg total) by mouth 2 (two) times daily with a meal.   OneTouch Delica Lancets 33G Misc Use to check blood sugar 2 times daily. E11.69   OneTouch Verio w/Device Kit Take 1 each by mouth in the morning and at bedtime. Use to test blood sugar daily   pantoprazole  40 MG tablet Commonly known as: PROTONIX  Take 1 tablet (40 mg total) by mouth 2 (two) times daily.   tamsulosin  0.4 MG Caps capsule Commonly known as: FLOMAX  Take 0.4 mg by mouth  daily.   traZODone  50 MG tablet Commonly known as: DESYREL  Take 0.5-1 tablets (25-50 mg total) by mouth at bedtime as needed for sleep.         Objective:   BP 135/84   Pulse 98   Ht 5' 2 (1.575 m)   Wt 128 lb (58.1 kg)   SpO2 97%   BMI 23.41 kg/m   Wt Readings from Last 3 Encounters:  05/19/24 128 lb (58.1 kg)  02/18/24 134 lb (60.8 kg)  02/02/24 133 lb 9.6 oz (60.6 kg)    Physical Exam Vitals and nursing note reviewed.  Constitutional:      General: He is not in acute distress.    Appearance: He is well-developed. He is not diaphoretic.  Eyes:     General: No scleral icterus.       Right eye: No discharge.     Conjunctiva/sclera: Conjunctivae normal.  Neck:     Thyroid: No thyromegaly.  Cardiovascular:     Rate and Rhythm: Normal rate and regular rhythm.     Heart sounds: Normal heart sounds. No murmur heard. Pulmonary:     Effort: Pulmonary effort is normal. No respiratory distress.     Breath sounds: Normal breath sounds. No wheezing.  Musculoskeletal:        General: No swelling. Normal range of motion.  Skin:    General: Skin is warm and dry.     Findings: No rash.  Neurological:     Mental Status: He is alert and oriented to person, place, and time.     Coordination: Coordination normal.  Psychiatric:        Behavior: Behavior normal.                Assessment & Plan:   Problem List Items Addressed This Visit       Cardiovascular and Mediastinum   Hypertension associated with diabetes (HCC)   Relevant Orders   CBC with Differential/Platelet   CMP14+EGFR   Lipid panel   TSH   PSA, total and free   Vitamin B12   Bayer DCA Hb A1c Waived     Endocrine   Type 2 diabetes mellitus with other specified complication (HCC) - Primary   Relevant Orders   Microalbumin / creatinine urine ratio   Microalbumin / creatinine urine ratio   Hyperlipidemia associated with type 2 diabetes mellitus (HCC)   Relevant Orders   CBC with  Differential/Platelet   CMP14+EGFR   Lipid panel   TSH   PSA, total and free   Vitamin B12   Bayer DCA  Hb A1c Waived   Other Visit Diagnoses       Prostate cancer (HCC)       Relevant Orders   PSA, total and free          Prostate cancer Undergoing radiation therapy with nausea and eating difficulties. Awaiting treatment efficacy results with a follow-up scan scheduled. - Continue Ensure three times daily for nutrition. - Schedule follow-up scan on January 20th to assess treatment efficacy.  Type 2 diabetes mellitus with hyperlipidemia and hypertension Blood pressure controlled at 135/84 mmHg. Continues on metformin  and Jardiance . No changes in hyperlipidemia management. - Continue metformin  and Jardiance  for diabetes management. - Checked urine to assess kidney function.  Chronic shoulder pain Pain persists despite previous treatment with injection, ointment, and topical medication. - Prescribed ointment and topical medication for shoulder pain. - Sent prescription to pharmacy.      Follow up plan: Return in about 3 months (around 08/17/2024), or if symptoms worsen or fail to improve, for diabetes.  Counseling provided for all of the vaccine components Orders Placed This Encounter  Procedures   CBC with Differential/Platelet   CMP14+EGFR   Lipid panel   TSH   PSA, total and free   Vitamin B12   Bayer DCA Hb A1c Waived   Microalbumin / creatinine urine ratio   Microalbumin / creatinine urine ratio    Fonda Levins, MD [facility] 05/19/2024, 2:14 PM

## 2024-05-20 ENCOUNTER — Ambulatory Visit: Payer: Self-pay | Admitting: Family Medicine

## 2024-05-20 LAB — CBC WITH DIFFERENTIAL/PLATELET
Basophils Absolute: 0 x10E3/uL (ref 0.0–0.2)
Basos: 1 %
EOS (ABSOLUTE): 0.7 x10E3/uL — ABNORMAL HIGH (ref 0.0–0.4)
Eos: 16 %
Hematocrit: 48.8 % (ref 37.5–51.0)
Hemoglobin: 15.9 g/dL (ref 13.0–17.7)
Immature Grans (Abs): 0 x10E3/uL (ref 0.0–0.1)
Immature Granulocytes: 0 %
Lymphocytes Absolute: 0.4 x10E3/uL — ABNORMAL LOW (ref 0.7–3.1)
Lymphs: 8 %
MCH: 29.8 pg (ref 26.6–33.0)
MCHC: 32.6 g/dL (ref 31.5–35.7)
MCV: 91 fL (ref 79–97)
Monocytes Absolute: 0.4 x10E3/uL (ref 0.1–0.9)
Monocytes: 9 %
Neutrophils Absolute: 2.8 x10E3/uL (ref 1.4–7.0)
Neutrophils: 65 %
Platelets: 227 x10E3/uL (ref 150–450)
RBC: 5.34 x10E6/uL (ref 4.14–5.80)
RDW: 14.8 % (ref 11.6–15.4)
WBC: 4.3 x10E3/uL (ref 3.4–10.8)

## 2024-05-20 LAB — CMP14+EGFR
ALT: 15 IU/L (ref 0–44)
AST: 13 IU/L (ref 0–40)
Albumin: 4.7 g/dL (ref 3.8–4.8)
Alkaline Phosphatase: 60 IU/L (ref 47–123)
BUN/Creatinine Ratio: 16 (ref 10–24)
BUN: 10 mg/dL (ref 8–27)
Bilirubin Total: 0.4 mg/dL (ref 0.0–1.2)
CO2: 19 mmol/L — ABNORMAL LOW (ref 20–29)
Calcium: 9.8 mg/dL (ref 8.6–10.2)
Chloride: 98 mmol/L (ref 96–106)
Creatinine, Ser: 0.63 mg/dL — ABNORMAL LOW (ref 0.76–1.27)
Globulin, Total: 1.7 g/dL (ref 1.5–4.5)
Glucose: 169 mg/dL — ABNORMAL HIGH (ref 70–99)
Potassium: 4.5 mmol/L (ref 3.5–5.2)
Sodium: 139 mmol/L (ref 134–144)
Total Protein: 6.4 g/dL (ref 6.0–8.5)
eGFR: 102 mL/min/1.73

## 2024-05-20 LAB — LIPID PANEL
Chol/HDL Ratio: 3.2 ratio (ref 0.0–5.0)
Cholesterol, Total: 125 mg/dL (ref 100–199)
HDL: 39 mg/dL — ABNORMAL LOW
LDL Chol Calc (NIH): 59 mg/dL (ref 0–99)
Triglycerides: 156 mg/dL — ABNORMAL HIGH (ref 0–149)
VLDL Cholesterol Cal: 27 mg/dL (ref 5–40)

## 2024-05-20 LAB — MICROALBUMIN / CREATININE URINE RATIO
Creatinine, Urine: 42.3 mg/dL
Microalb/Creat Ratio: 35 mg/g{creat} — ABNORMAL HIGH (ref 0–29)
Microalbumin, Urine: 15 ug/mL

## 2024-05-20 LAB — PSA, TOTAL AND FREE
PSA, Free Pct: 16.3 %
PSA, Free: 0.13 ng/mL
Prostate Specific Ag, Serum: 0.8 ng/mL (ref 0.0–4.0)

## 2024-05-20 LAB — VITAMIN B12: Vitamin B-12: 257 pg/mL (ref 232–1245)

## 2024-05-20 LAB — TSH: TSH: 1.78 u[IU]/mL (ref 0.450–4.500)

## 2024-06-27 ENCOUNTER — Other Ambulatory Visit: Payer: Self-pay

## 2024-06-27 ENCOUNTER — Encounter (HOSPITAL_COMMUNITY): Payer: Self-pay

## 2024-06-27 ENCOUNTER — Emergency Department (HOSPITAL_COMMUNITY)
Admission: EM | Admit: 2024-06-27 | Discharge: 2024-06-27 | Disposition: A | Discharge status: Home / Self Care | Attending: Emergency Medicine | Admitting: Emergency Medicine

## 2024-06-27 ENCOUNTER — Emergency Department (HOSPITAL_COMMUNITY)

## 2024-06-27 DIAGNOSIS — Z79899 Other long term (current) drug therapy: Secondary | ICD-10-CM | POA: Diagnosis not present

## 2024-06-27 DIAGNOSIS — E119 Type 2 diabetes mellitus without complications: Secondary | ICD-10-CM | POA: Diagnosis not present

## 2024-06-27 DIAGNOSIS — R42 Dizziness and giddiness: Secondary | ICD-10-CM | POA: Insufficient documentation

## 2024-06-27 DIAGNOSIS — I10 Essential (primary) hypertension: Secondary | ICD-10-CM | POA: Insufficient documentation

## 2024-06-27 DIAGNOSIS — D72819 Decreased white blood cell count, unspecified: Secondary | ICD-10-CM | POA: Insufficient documentation

## 2024-06-27 DIAGNOSIS — Z7984 Long term (current) use of oral hypoglycemic drugs: Secondary | ICD-10-CM | POA: Diagnosis not present

## 2024-06-27 LAB — URINALYSIS, ROUTINE W REFLEX MICROSCOPIC
Bilirubin Urine: NEGATIVE
Glucose, UA: 50 mg/dL — AB
Hgb urine dipstick: NEGATIVE
Ketones, ur: NEGATIVE mg/dL
Leukocytes,Ua: NEGATIVE
Nitrite: NEGATIVE
Protein, ur: NEGATIVE mg/dL
Specific Gravity, Urine: 1.004 — ABNORMAL LOW (ref 1.005–1.030)
pH: 8 (ref 5.0–8.0)

## 2024-06-27 LAB — COMPREHENSIVE METABOLIC PANEL WITH GFR
ALT: 49 U/L — ABNORMAL HIGH (ref 0–44)
AST: 33 U/L (ref 15–41)
Albumin: 4.5 g/dL (ref 3.5–5.0)
Alkaline Phosphatase: 62 U/L (ref 38–126)
Anion gap: 17 — ABNORMAL HIGH (ref 5–15)
BUN: 13 mg/dL (ref 8–23)
CO2: 20 mmol/L — ABNORMAL LOW (ref 22–32)
Calcium: 9.5 mg/dL (ref 8.9–10.3)
Chloride: 101 mmol/L (ref 98–111)
Creatinine, Ser: 0.55 mg/dL — ABNORMAL LOW (ref 0.61–1.24)
GFR, Estimated: >60 mL/min
Glucose, Bld: 151 mg/dL — ABNORMAL HIGH (ref 70–99)
Potassium: 4 mmol/L (ref 3.5–5.1)
Sodium: 138 mmol/L (ref 135–145)
Total Bilirubin: 0.4 mg/dL (ref 0.0–1.2)
Total Protein: 6.5 g/dL (ref 6.5–8.1)

## 2024-06-27 LAB — CBC
HCT: 42.4 % (ref 39.0–52.0)
Hemoglobin: 14.4 g/dL (ref 13.0–17.0)
MCH: 30.8 pg (ref 26.0–34.0)
MCHC: 34 g/dL (ref 30.0–36.0)
MCV: 90.6 fL (ref 80.0–100.0)
Platelets: 178 10*3/uL (ref 150–400)
RBC: 4.68 MIL/uL (ref 4.22–5.81)
RDW: 15.4 % (ref 11.5–15.5)
WBC: 3.5 10*3/uL — ABNORMAL LOW (ref 4.0–10.5)
nRBC: 0 % (ref 0.0–0.2)

## 2024-06-27 LAB — RESP PANEL BY RT-PCR (RSV, FLU A&B, COVID)  RVPGX2
Influenza A by PCR: NEGATIVE
Influenza B by PCR: NEGATIVE
Resp Syncytial Virus by PCR: NEGATIVE
SARS Coronavirus 2 by RT PCR: NEGATIVE

## 2024-06-27 LAB — LIPASE, BLOOD: Lipase: 183 U/L — ABNORMAL HIGH (ref 11–51)

## 2024-06-27 MED ORDER — MECLIZINE HCL 12.5 MG PO TABS
25.0000 mg | ORAL_TABLET | Freq: Once | ORAL | Status: AC
  Administered 2024-06-27: 25 mg via ORAL
  Filled 2024-06-27: qty 2

## 2024-06-27 MED ORDER — MECLIZINE HCL 25 MG PO TABS: 25.0000 mg | ORAL_TABLET | Freq: Three times a day (TID) | ORAL | 0 refills | Status: AC | PRN

## 2024-06-27 MED ORDER — KETOROLAC TROMETHAMINE 15 MG/ML IJ SOLN
15.0000 mg | Freq: Once | INTRAMUSCULAR | Status: AC
  Administered 2024-06-27: 15 mg via INTRAVENOUS
  Filled 2024-06-27: qty 1

## 2024-06-27 MED ORDER — SODIUM CHLORIDE 0.9 % IV BOLUS
1000.0000 mL | Freq: Once | INTRAVENOUS | Status: AC
  Administered 2024-06-27: 1000 mL via INTRAVENOUS

## 2024-06-27 NOTE — ED Notes (Signed)
 Patient transported to CT

## 2024-06-27 NOTE — ED Notes (Signed)
 The patient was ambulatory to restroom with his cane.

## 2024-06-27 NOTE — ED Provider Notes (Signed)
 " [place] [facility] DE[facility]MENT AT [facility] Provider Note   CSN: [id number] Arrival date & time: 06/27/24  1830     Patient presents with: Emesis   Chase [name] is a 74 y.o. male patient with history of diabetes, high cholesterol, hypertension who presents to the emergency department today for further evaluation of headache, nausea, vomiting, and dizziness.  Son is at bedside and interprets as this is the patient's preference.  He states that he always suffers from dizziness which she describes as a room spinning sensation.  He also has headache most of the time.  However, over the last several days headache and dizziness have become severe and now he is having nausea and vomiting.  He denies any focal weakness or numbness, fever, chills, abdominal pain, diarrhea.  Dizziness is worse when he looks to the right.    Emesis      Prior to Admission medications  Medication Sig Start Date End Date Taking? Authorizing Provider  meclizine  (ANTIVERT ) 25 MG tablet Take 1 tablet (25 mg total) by mouth 3 (three) times daily as needed for dizziness. 06/27/24  Yes Theotis, Raeley Gilmore M, PA-C  atorvastatin  (LIPITOR) 40 MG tablet Take 1 tablet (40 mg total) by mouth every evening. 05/02/24   Dettinger, Fonda LABOR, MD  blood glucose meter kit and supplies KIT Dispense based on patient and insurance preference. Use up to four times daily as directed. 02/01/21   Dettinger, Fonda LABOR, MD  Blood Glucose Monitoring Suppl (ONETOUCH VERIO) w/Device KIT Take 1 each by mouth in the morning and at bedtime. Use to test blood sugar daily 08/01/20   Dettinger, Fonda LABOR, MD  empagliflozin  (JARDIANCE ) 25 MG TABS tablet Take 1 tablet (25 mg total) by mouth daily before breakfast. 05/02/24   Dettinger, Fonda LABOR, MD  glucose blood test strip Use as instructed to test blood sugar once daily.  Please fill One touch verio test strips. DX:E11.69 09/26/19   Dettinger, Fonda LABOR, MD  lisinopril  (ZESTRIL ) 5 MG tablet Take 1  tablet (5 mg total) by mouth daily. 05/02/24   Dettinger, Fonda LABOR, MD  metFORMIN  (GLUCOPHAGE ) 500 MG tablet Take 2 tablets (1,000 mg total) by mouth 2 (two) times daily with a meal. 05/02/24   Dettinger, Fonda LABOR, MD  OneTouch Delica Lancets 33G MISC Use to check blood sugar 2 times daily. E11.69 10/17/19   Dettinger, Fonda LABOR, MD  pantoprazole  (PROTONIX ) 40 MG tablet Take 1 tablet (40 mg total) by mouth 2 (two) times daily. 12/18/23 12/17/24  Cindie Carlin POUR, DO  tamsulosin  (FLOMAX ) 0.4 MG CAPS capsule Take 0.4 mg by mouth daily.    [provider]  traZODone  (DESYREL ) 50 MG tablet Take 0.5-1 tablets (25-50 mg total) by mouth at bedtime as needed for sleep. 02/18/24   Dettinger, Fonda LABOR, MD    Allergies: Patient has no known allergies.    Review of Systems  Gastrointestinal:  Positive for vomiting.  All other systems reviewed and are negative.   Updated Vital Signs BP 118/68 (BP Location: Right Arm)   Pulse 62   Temp 98.6 F (37 C) (Oral)   Resp 18   Wt 58.1 kg   SpO2 98%   BMI 23.43 kg/m   Physical Exam Vitals and nursing note reviewed.  Constitutional:      General: He is not in acute distress.    Appearance: Normal appearance.  HENT:     Head: Normocephalic and atraumatic.  Eyes:     General:  Right eye: No discharge.        Left eye: No discharge.  Cardiovascular:     Comments: Regular rate and rhythm.  S1/S2 are distinct without any evidence of murmur, rubs, or gallops.  Radial pulses are 2+ bilaterally.  Dorsalis pedis pulses are 2+ bilaterally.  No evidence of pedal edema. Pulmonary:     Comments: Clear to auscultation bilaterally.  Normal effort.  No respiratory distress.  No evidence of wheezes, rales, or rhonchi heard throughout. Abdominal:     General: Abdomen is flat. Bowel sounds are normal. There is no distension.     Tenderness: There is no abdominal tenderness. There is no guarding or rebound.  Musculoskeletal:        General: Normal range of  motion.     Cervical back: Neck supple.  Skin:    General: Skin is warm and dry.     Findings: No rash.  Neurological:     General: No focal deficit present.     Mental Status: He is alert.     Comments: Cranial nerves II through XII are intact.  5/5 strength to the upper and lower extremities.  Normal sensation to the upper and lower extremities.  Extraocular movements are intact without any obvious nystagmus.  Dizziness exacerbated when looking to the right.  Speech is normal.  Psychiatric:        Mood and Affect: Mood normal.        Behavior: Behavior normal.     (all labs ordered are listed, but only abnormal results are displayed) Labs Reviewed  LIPASE, BLOOD - Abnormal; Notable for the following components:      Result Value   Lipase 183 (*)    All other components within normal limits  COMPREHENSIVE METABOLIC PANEL WITH GFR - Abnormal; Notable for the following components:   CO2 20 (*)    Glucose, Bld 151 (*)    Creatinine, Ser 0.55 (*)    ALT 49 (*)    Anion gap 17 (*)    All other components within normal limits  CBC - Abnormal; Notable for the following components:   WBC 3.5 (*)    All other components within normal limits  URINALYSIS, ROUTINE W REFLEX MICROSCOPIC - Abnormal; Notable for the following components:   Color, Urine STRAW (*)    Specific Gravity, Urine 1.004 (*)    Glucose, UA 50 (*)    All other components within normal limits  RESP PANEL BY RT-PCR (RSV, FLU A&B, COVID)  [id number]    EKG: EKG Interpretation Date/Time:  Monday June 27 2024 18:58:07 EST Ventricular Rate:  66 PR Interval:  139 QRS Duration:  91 QT Interval:  397 QTC Calculation: 416 R Axis:   95  Text Interpretation: Sinus rhythm Right axis deviation Low voltage, extremity leads Minimal ST elevation, inferior leads No significant change since prior 10/19 Confirmed by Towana Sharper [id number]) on 06/27/2024 7:07:18 PM  Radiology: CT Head Wo Contrast Result Date: 06/27/2024 EXAM: CT  HEAD WITHOUT CONTRAST 06/27/2024 07:43:18 PM TECHNIQUE: CT of the head was performed without the administration of intravenous contrast. Automated exposure control, iterative reconstruction, and/or weight based adjustment of the mA/kV was utilized to reduce the radiation dose to as low as reasonably achievable. COMPARISON: None available. CLINICAL HISTORY: Sudden onset of headache with vomiting, initial encounter. FINDINGS: BRAIN AND VENTRICLES: Chronic atrophic changes are noted. No acute hemorrhage. No evidence of acute infarct. No extra-axial collection. No mass effect or midline shift. ORBITS: Orbits and  their contents are unremarkable. SINUSES: Paranasal sinuses are all within normal limits. SOFT TISSUES AND SKULL: No acute soft tissue abnormality. No acute skull abnormality is noted. IMPRESSION: 1. Atrophic changes without  acute intracranial abnormality. Electronically signed by: Oneil Devonshire MD 06/27/2024 07:45 PM EST RP Workstation: [id number]     Procedures   Medications Ordered in the ED  ketorolac  (TORADOL ) 15 MG/ML injection 15 mg (has no administration in time range)  sodium chloride  0.9 % bolus 1,000 mL (1,000 mLs Intravenous New Bag/Given 06/27/24 1935)  meclizine  (ANTIVERT ) tablet 25 mg (25 mg Oral Given 06/27/24 1935)    Clinical Course as of 06/27/24 2121  Mon Jun 27, 2024  2039 CBC(!) There is leukopenia.  [CF]  2039 Comprehensive metabolic panel(!) Negative.  [CF]  2039 Resp panel by RT-PCR (RSV, Flu A&B, Covid) Anterior Nasal Swab Negative.  [CF]  2039 Lipase, blood(!) Elevated and meets criteria for pancreatitis. Patient is not having any epigastric abdominal pain nor does he have any significant risk factors for pancreatitis at this time.  [CF]  2040 CT Head Wo Contrast No evidence of intracranial hemorrhage.  I do agree with radiologist interpretation. [CF]  2110 Urinalysis, Routine w reflex microscopic -Urine, Clean Catch(!) Negative.  [CF]  2112 I went in and talked  with the patient and family again with a formal medical interpreter and patient has been having headache and dizziness intermittently for 2-3 months.  He has seen his primary care doctor for this and he prescribed him Zofran  which he states does not really help.  Patient also suffers from chronic generalized weakness and states that his gait is not normally very good has been this way for the last 24 years.  What brought him to the emergency department was him having vomiting the last couple of days which is atypical for him. [CF]  2113 We went over all labs and imaging with him and his family at the bedside along with the medical interpreter.  We talked about etiologies of his dizziness.  Believe this is likely BPPV.  When patient looks to the right his dizziness is severe.  Neurological exam is still normal on repeat.  He states that the meclizine  greatly improved his dizziness.  He is feeling better after fluids. Will plan to discharge him home with follow up with PCP. We dicussed that he might need to see an ENT for this if his symptoms continue.  [CF]    Clinical Course User Index [CF] Theotis Cameron HERO, PA-C    Medical Decision Making Collyn Ribas Christinia is a 74 y.o. male patient who presents to the emergency department today for further evaluation of headache, nausea, vomiting, and dizziness.  Differential diagnosis includes gastroenteritis, tension headache, central vertigo, BPPV, viral illness.  Patient's neurological exam is completely normal.  Will start off by getting a CT head without contrast to look for any obvious central process.  Will plan on giving him some fluids and meclizine .  Will plan to reassess.  As highlighted in the ED course, this seems consistent with BPPV.  Patient feeling back to baseline after meclizine .  Will give him meclizine  to go home with.  He has Zofran  at home.  Low suspicion for central process at this time.  Will have him follow-up with his primary care doctor for  further evaluation.  Patient agreeable with plan.  Strict turn precaution were discussed.  He is safe for discharge.   Amount and/or Complexity of Data Reviewed Labs:  Decision-making details documented  in ED Course. Radiology: ordered. Decision-making details documented in ED Course.  Risk Prescription drug management.     Final diagnoses:  Dizziness    ED Discharge Orders          Ordered    meclizine  (ANTIVERT ) 25 MG tablet  3 times daily PRN        06/27/24 2120               Theotis Peers [place], [place] 06/27/24 2121  "

## 2024-06-27 NOTE — Discharge Instructions (Addendum)
 Please follow-up with your primary care doctor for further evaluation.  You may return to the emergency department for any worsening symptoms.

## 2024-06-27 NOTE — ED Triage Notes (Signed)
Pt reports headache and vomiting x 2 days.

## 2024-06-28 ENCOUNTER — Ambulatory Visit: Payer: Self-pay

## 2024-06-28 NOTE — Telephone Encounter (Signed)
 FYI Only or Action Required?: FYI only for provider: appointment scheduled on 1/28.  Patient was last seen in primary care on 05/19/2024 by Dettinger, Fonda LABOR, MD.  Called Nurse Triage reporting Nausea, Vomiting, Headache, Dizziness (/), and Shortness of Breath.  Symptoms began several days ago.  Interventions attempted: Prescription medications: Meclizine .  Symptoms are: gradually worsening.  Triage Disposition: See Within 3 Days in Office  Patient/caregiver understands and will follow disposition?: Yes    Using [facility] PI#[id number].   Spoke with pt and his daughter in law [place]. 3 days ago onset nausea, vomiting, headaches and dizziness. Went to ED on 1/26 for headaches, n/v, dizziness. Head CT negative. Prescribed meclizine , has not picked up yet. Planning to pick up today.   Pt calling to make f/u appt with provider. Reports persistent severe nausea, improved some from yesterday. No emesis today. Threw up the past 2 days, no blood, just clear. Moderate dizziness/lightheadedness. Hx of dizziness. Intermittent moderate SOB over the past month with increasing frequency over the past 3 days. Chase [name] reports breathing appears non-labored. No CP. Some anxiety. Able to walk around. Has diabetes, reports BG controlled. No signs of dehydration. No fever. Overall symptoms improved since yesterday except nausea.  Scheduled appt with different provider at home office d/t no PCP availability within timeframe. Advised UC or ED for worsening symptoms.     Message from Antwanette L sent at 06/28/2024  3:37 PM EST  Reason for Triage: Chase [name], the pt daughter in law is calling b/c the pt is still experiencing anxiety and headaches. The pt went to the ER on 1/26 for dizziness. He needs to schedule a follow up visit and a referral to see a specialist.   Additional Information  Commented on: All Negative - Home Care    Just seen in ED yesterday. Symptoms overall improved except nausea.  Wanting to schedule ED f/u appt. Ok to be seen in next 3 days per [employer].  Answer Assessment - Initial Assessment Questions 1. NAUSEA SEVERITY: How bad is the nausea? (e.g., mild, moderate, severe; dehydration, weight loss)     Severe  2. ONSET: When did the nausea begin?     3 days ago  3. VOMITING: Any vomiting? If Yes, ask: How many times today?     None today. Threw up the past 2 days, clear, no blood  4. RECURRENT SYMPTOM: Have you had nausea before? If Yes, ask: When was the last time? What happened that time?     Yes, was prescribed zofran  in the past.  5. CAUSE: What do you think is causing the nausea?     Unsure  Protocols used: Nausea-A-AH

## 2024-06-29 ENCOUNTER — Ambulatory Visit: Admitting: Family Medicine

## 2024-06-29 ENCOUNTER — Encounter: Payer: Self-pay | Admitting: Family Medicine

## 2024-06-29 VITALS — BP 113/66 | HR 86 | Temp 97.5°F | Ht 62.0 in | Wt 130.0 lb

## 2024-06-29 DIAGNOSIS — G4459 Other complicated headache syndrome: Secondary | ICD-10-CM

## 2024-06-29 DIAGNOSIS — E1159 Type 2 diabetes mellitus with other circulatory complications: Secondary | ICD-10-CM

## 2024-06-29 DIAGNOSIS — C884 Extranodal marginal zone b-cell lymphoma of mucosa-associated lymphoid tissue (malt-lymphoma) not having achieved remission: Secondary | ICD-10-CM

## 2024-06-29 DIAGNOSIS — E1169 Type 2 diabetes mellitus with other specified complication: Secondary | ICD-10-CM | POA: Diagnosis not present

## 2024-06-29 DIAGNOSIS — I152 Hypertension secondary to endocrine disorders: Secondary | ICD-10-CM | POA: Diagnosis not present

## 2024-06-29 MED ORDER — PREDNISONE 10 MG PO TABS: ORAL_TABLET | ORAL | 0 refills | Status: AC

## 2024-06-29 MED ORDER — ESOMEPRAZOLE MAGNESIUM 40 MG PO CPDR: 40.0000 mg | DELAYED_RELEASE_CAPSULE | Freq: Two times a day (BID) | ORAL | 3 refills | Status: AC

## 2024-06-29 NOTE — Progress Notes (Signed)
 "  Subjective:  Patient ID: Chase [name], male    DOB: 1950-06-30  Age: 74 y.o. MRN: [id number]  CC: Hospitalization Follow-up (Interpreter on call./Pt reports headaches with dizziness. Headache is all over and pretty severe. Pt has nausea and stomach pain. Makes it hard to breath when it occurs. This has happened once before. CT did not show anything. Given meclizine . Weren't able to pinpoint any cause. //Has been seen for this before first happened about 2 months. Only given stuff for nausea but nothing that helps headache. ) and poor appetite (Pt does not have much of a appetite. When he does eat he becomes nauseous or has to use bathroom instantly. On and off but pretty consistent. Reports diet is good. NO acid reflux.)   HPI  Discussed the use of AI scribe software for clinical note transcription with the patient, who gave verbal consent to proceed.  History of Present Illness Chase [name] is a 74 year old male with lymphoma who presents with headaches and stomach discomfort.  He experiences severe headaches with a pain intensity of four to five out of ten, primarily affecting the temples. These headaches are diffuse, last about an hour, recur several times in a row, and may not return for a month. There is no altered level of conciousness associated  He also has nausea and stomach discomfort, particularly in the epigastric region. He has been taking pantoprazole  without relief. He has a poor appetite and wants something to help him eat better.  He has a history of lymphoma and underwent a CT scan and a PET scan recently. A PET scan three months ago revealed a hypermetabolic lower left paratracheal lymph node.  He has diabetes with a recent A1c of 6.0 from approximately one month ago. No excessive thirst.          06/29/2024    2:08 PM 05/19/2024    1:48 PM 02/18/2024    1:32 PM  Depression screen PHQ 2/9  Decreased Interest 1 2 3   Down, Depressed, Hopeless 2 2 3   PHQ -  2 Score 3 4 6   Altered sleeping 3 2 3   Tired, decreased energy 3 1 3   Change in appetite 3 1 3   Feeling bad or failure about yourself  1 1 2   Trouble concentrating 2 1 2   Moving slowly or fidgety/restless 2 1 2   Suicidal thoughts 0 0 0  PHQ-9 Score 17 11 21    Difficult doing work/chores Somewhat difficult Not difficult at all Somewhat difficult     Data saved with a previous flowsheet row definition    History [place] has a past medical history of Arthritis, Cancer (HCC), Depression, Diabetes mellitus without complication (HCC), Headache, High cholesterol, Hypertension, Inguinal hernia, Neck pain, Numbness and tingling in hands, and Urinary frequency.   He has a past surgical history that includes Shoulder arthroscopy (Right); Anterior cervical decomp/discectomy fusion (N/A, 04/04/2015); UPPER EXTREMITY VENOGRAPHY (N/A, 07/22/2016); Back surgery; Inguinal hernia repair (Right, 12/22/2018); Colonoscopy with propofol  (N/A, 03/22/2019); polypectomy (03/22/2019); Colonoscopy (N/A, 12/18/2023); and Esophagogastroduodenoscopy (N/A, 12/18/2023).   His family history is not on file.He reports that he quit smoking about 6 years ago. His smoking use included cigarettes. He started smoking about 46 years ago. He has a 10 pack-year smoking history. He has never used smokeless tobacco. He reports that he does not currently use alcohol. He reports that he does not use drugs.    ROS Review of Systems  Objective:  BP 113/66   Pulse  86   Temp (!) 97.5 F (36.4 C)   Ht 5' 2 (1.575 m)   Wt 130 lb (59 kg)   SpO2 97%   BMI 23.78 kg/m   BP Readings from Last 3 Encounters:  06/29/24 113/66  06/27/24 116/70  05/19/24 135/84    Wt Readings from Last 3 Encounters:  06/29/24 130 lb (59 kg)  06/27/24 128 lb 1.4 oz (58.1 kg)  05/19/24 128 lb (58.1 kg)     Physical Exam Physical Exam GENERAL: Alert, cooperative, well developed, no acute distress. HEENT: Normocephalic, normal oropharynx, moist mucous  membranes, tympanic membranes clear, pharynx without erythema. CHEST: Clear to auscultation bilaterally, no wheezes, rhonchi, or crackles. CARDIOVASCULAR: Normal heart rate and rhythm, S1 and S2 normal without murmurs. ABDOMEN: Soft, non-tender, non-distended, without organomegaly, normal bowel sounds, epigastric tenderness on palpation. EXTREMITIES: No cyanosis or edema. NEUROLOGICAL: Cranial nerves grossly intact, moves all extremities without gross motor or sensory deficit, alert and oriented x3, normal coordination, pupils equal and reactive to light, extraocular movements intact.   Assessment & Plan:  Other complicated headache syndrome  Hypertension associated with diabetes (HCC)  MALToma (HCC)  Type 2 diabetes mellitus with other specified complication, without long-term current use of insulin (HCC)  Other orders -     Esomeprazole  Magnesium ; Take 1 capsule (40 mg total) by mouth 2 (two) times daily before a meal.  Dispense: 180 capsule; Refill: 3 -     predniSONE ; Take 5 daily for 3 days followed by 4,3,2 and 1 for 3 days each.  Dispense: 45 tablet; Refill: 0    Assessment and Plan Assessment & Plan Headache and epigastric pain   He experiences intermittent headaches rated 4-5/10, severe, located in the temples, lasting about an hour, recurring several times before subsiding for a month. These are accompanied by nausea and epigastric discomfort. A CT of the head showed chronic atrophic changes without acute hemorrhage, infarct, or extra-axial collections. Sinuses are normal. A PET scan three months ago revealed a hypermetabolic lower left paratracheal lymph node. Exam shows epigastric tenderness. Pantoprazole  has been ineffective for stomach symptoms.  History of non-Hodgkin lymphoma   A PET scan three months ago showed a hypermetabolic lower left paratracheal lymph node. Has appt. In 2 days with his cancer specialist.  Type 2 diabetes mellitus   Recent A1c is 6.0, indicating  good control. He reports no excessive thirst.       Follow-up: No follow-ups on file.  Butler Der, M.D. "

## 2024-06-29 NOTE — Telephone Encounter (Signed)
 Fyi appt made

## 2024-07-21 ENCOUNTER — Ambulatory Visit: Admitting: Gastroenterology

## 2024-08-17 ENCOUNTER — Ambulatory Visit: Admitting: Family Medicine

## 2024-12-13 ENCOUNTER — Ambulatory Visit: Payer: Self-pay
